# Patient Record
Sex: Male | Born: 1970
Health system: Southern US, Community
[De-identification: ages and names within clinical notes are randomized; demographics above are authoritative.]

## PROBLEM LIST (undated history)

## (undated) DIAGNOSIS — F419 Anxiety disorder, unspecified: Secondary | ICD-10-CM

## (undated) DIAGNOSIS — J45909 Unspecified asthma, uncomplicated: Secondary | ICD-10-CM

## (undated) DIAGNOSIS — N189 Chronic kidney disease, unspecified: Secondary | ICD-10-CM

## (undated) DIAGNOSIS — T7840XA Allergy, unspecified, initial encounter: Secondary | ICD-10-CM

## (undated) DIAGNOSIS — N200 Calculus of kidney: Secondary | ICD-10-CM

## (undated) HISTORY — DX: Unspecified asthma, uncomplicated: J45.909

## (undated) HISTORY — DX: Chronic kidney disease, unspecified: N18.9

## (undated) HISTORY — DX: Allergy, unspecified, initial encounter: T78.40XA

## (undated) HISTORY — PX: HERNIA REPAIR: SHX51

## (undated) HISTORY — PX: WISDOM TOOTH EXTRACTION: SHX21

---

## 2004-07-28 ENCOUNTER — Emergency Department: Payer: Self-pay | Admitting: Emergency Medicine

## 2005-01-02 ENCOUNTER — Ambulatory Visit: Payer: Self-pay

## 2005-03-15 IMAGING — CT CT ABD-PELV W/O CM
1 of 3 series · 15 of 32 positions shown, 19 images · non-contrast
Comparison: none

REASON FOR EXAM: (1) abd pain   poss stone  rm 4; (2) abd pain  poss stone
COMMENTS:

[Series 3: inspace · axial · 0.61mm/px · z∈[-1082,-703]mm · 15 of 517 slices shown, 19 images]
[im 22/517  soft-tissue]
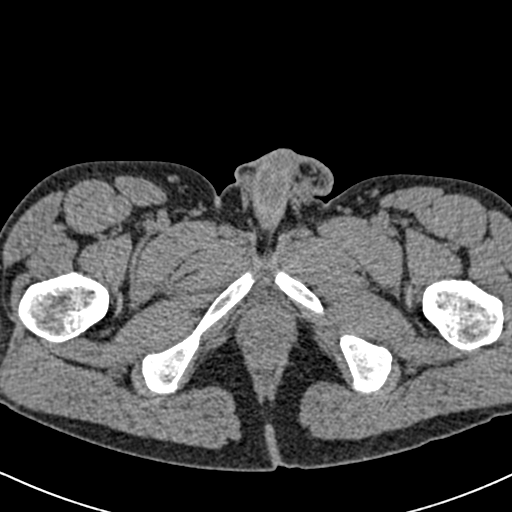
[im 22/517  bone]
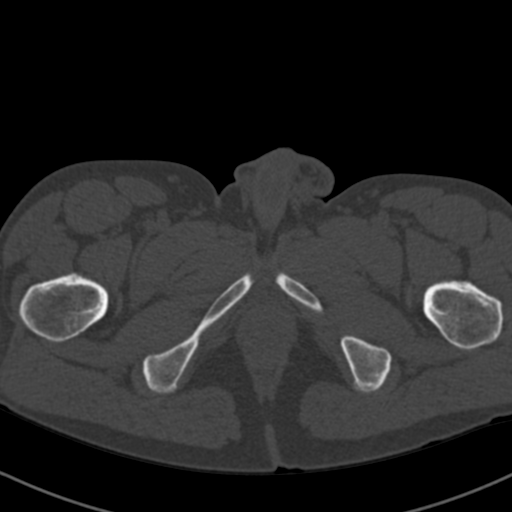
[im 65/517  soft-tissue]
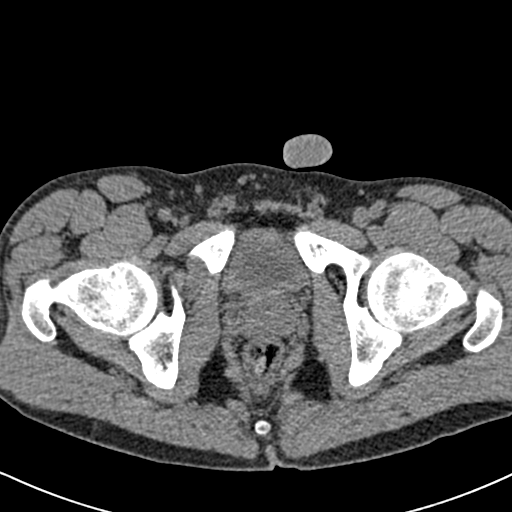
[im 108/517  soft-tissue]
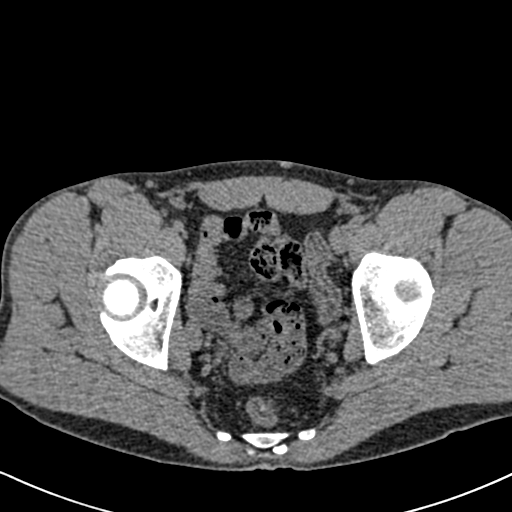
[im 151/517  soft-tissue]
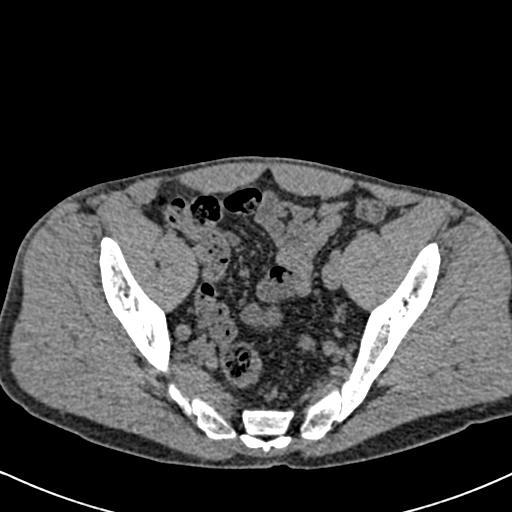
[im 173/517  soft-tissue]
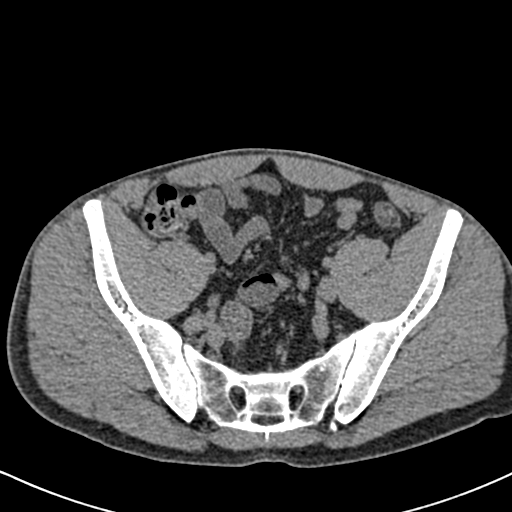
[im 216/517  soft-tissue]
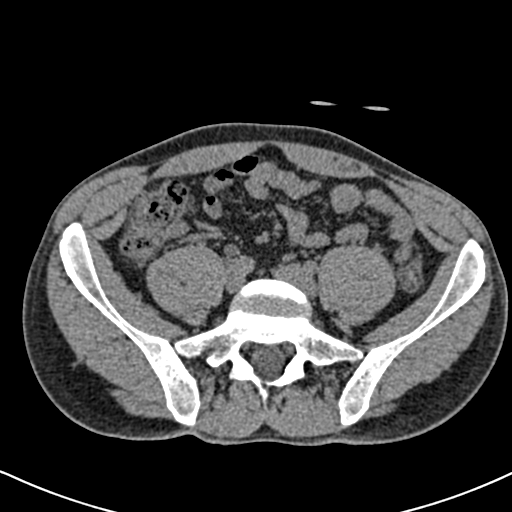
[im 259/517  soft-tissue]
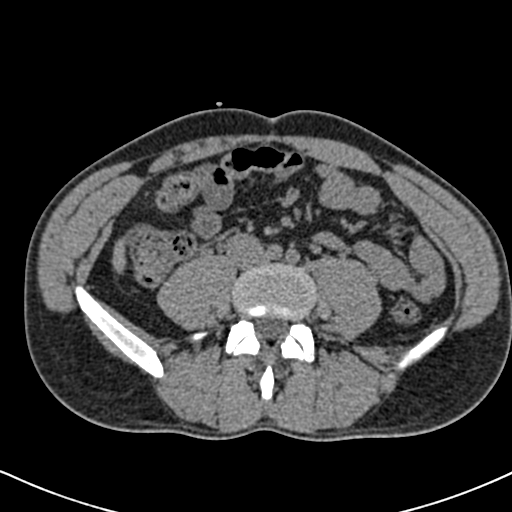
[im 302/517  soft-tissue]
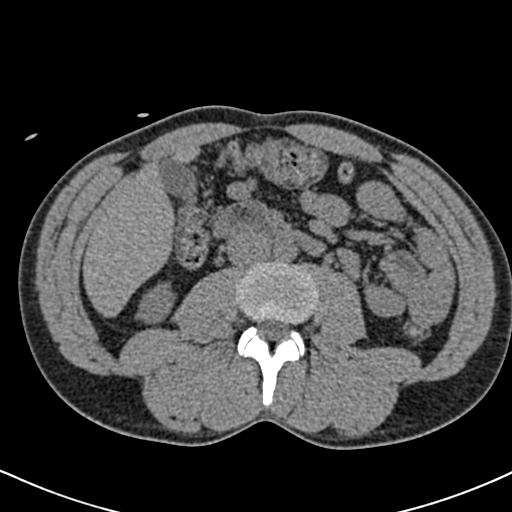
[im 345/517  soft-tissue]
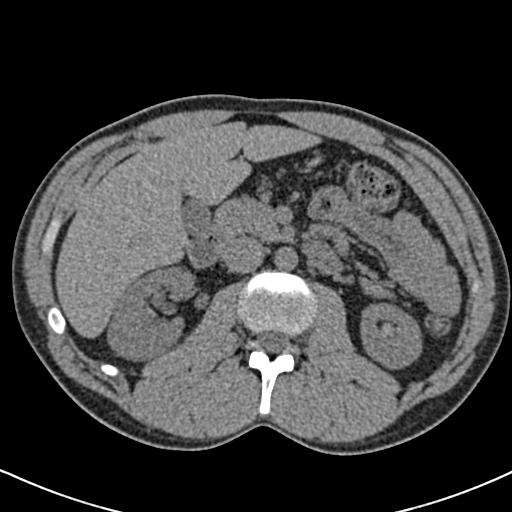
[im 345/517  bone]
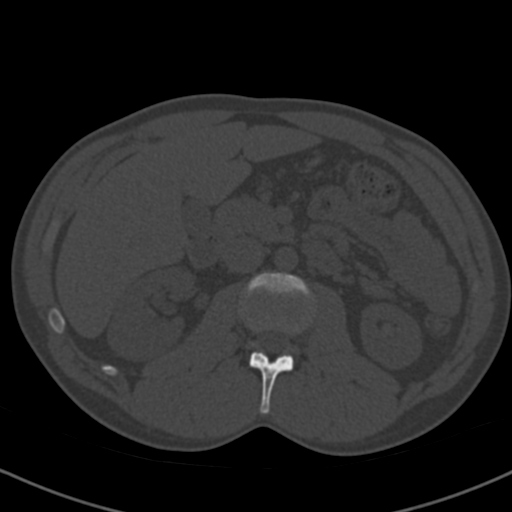
[im 366/517  soft-tissue]
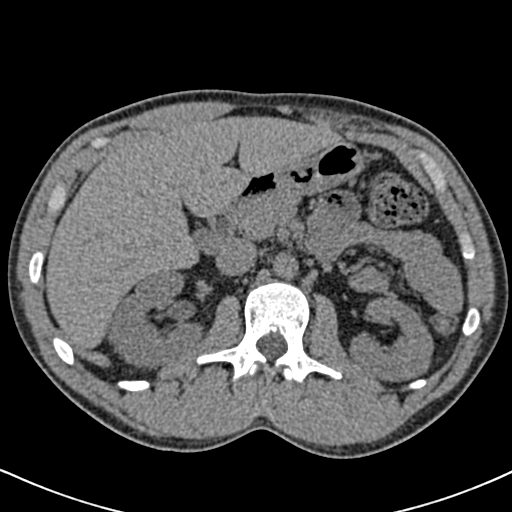
[im 409/517  soft-tissue]
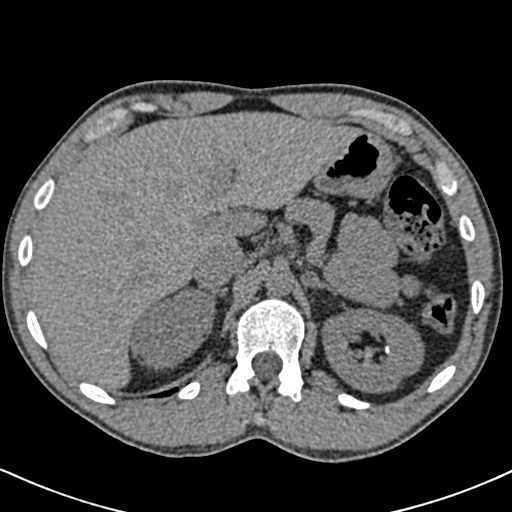
[im 431/517  lung]
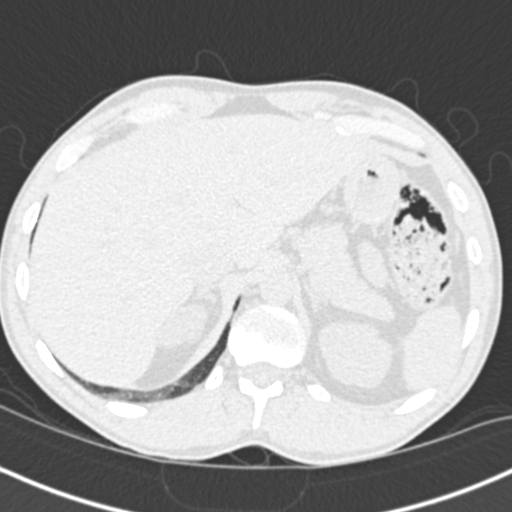
[im 452/517  soft-tissue]
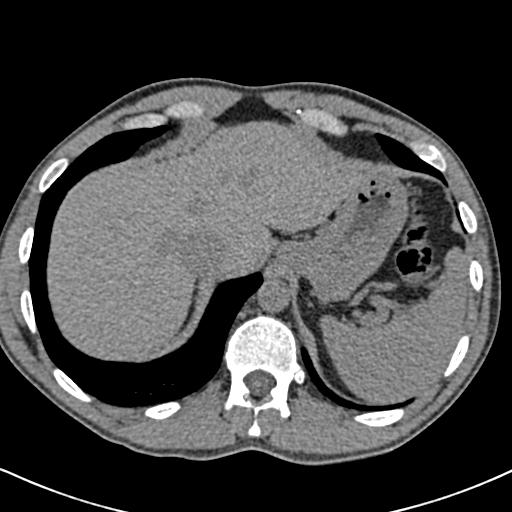
[im 452/517  lung]
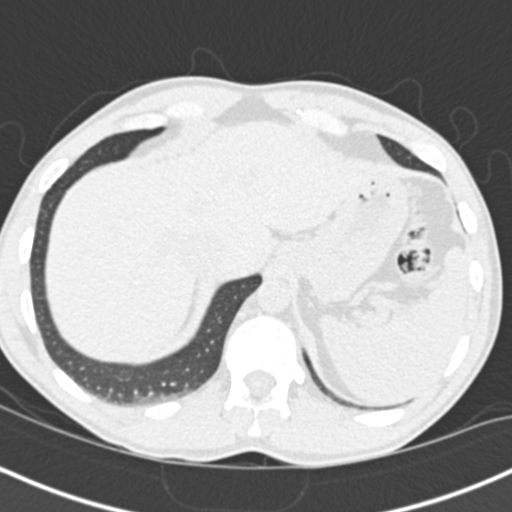
[im 474/517  lung]
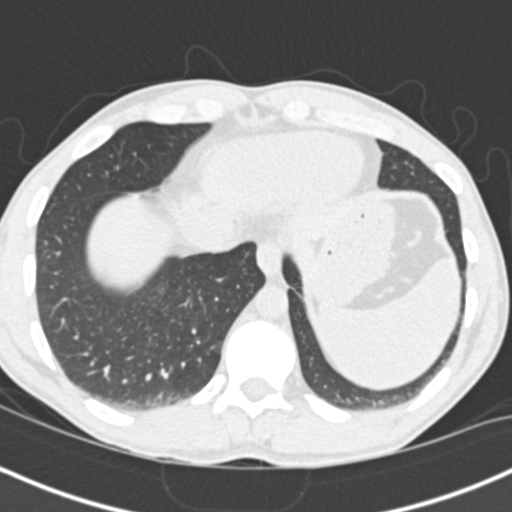
[im 495/517  soft-tissue]
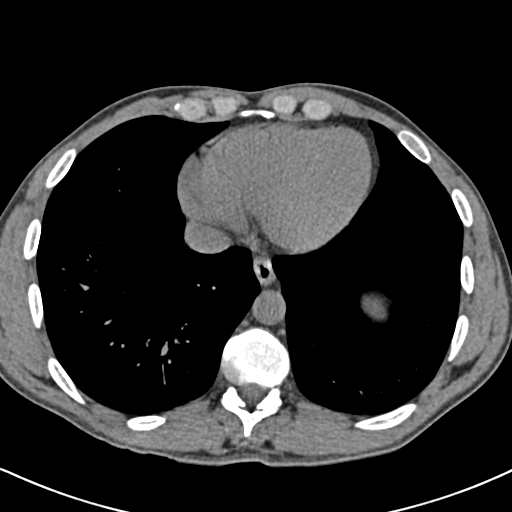
[im 495/517  lung]
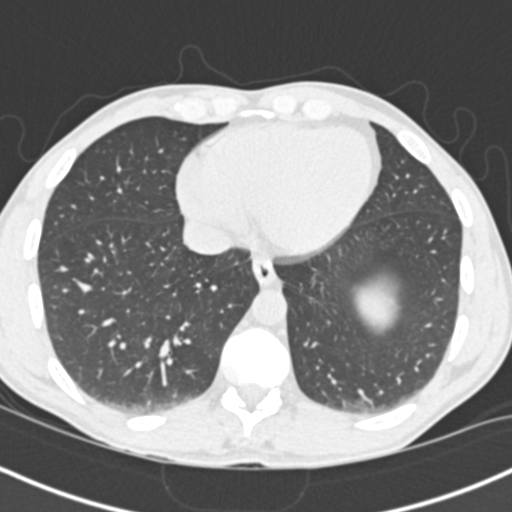

[15 of 32 positions shown; findings below may reference images not displayed]

PROCEDURE:     CT  - CT ABDOMEN AND PELVIS W[DATE]  [DATE]

RESULT:        3-mm unenhanced cuts through the abdomen and pelvis were
performed.  The lung bases are clear aside from some dependent atelectasis.
The unenhanced cuts through the abdomen and pelvis show no obvious solid
organ abnormality aside from the kidneys. Each kidneys show small 1-2 mm
nonobstructing corticomedullary junction nephrolithiasis.  The LEFT kidney
does not show evidence of hydronephrosis, hydroureter or perinephric
stranding.  The RIGHT kidney does not show perinephric stranding but there
is Grade I-II hydronephrosis and hydroureter present.  No free
intraperitoneal fluid, air or adenopathy is present.  Image #115 in the
pelvis shows a 2 mm calcification in the distal RIGHT ureter near the RIGHT
UVJ and most likely causing the hydroureter and hydronephrosis of the RIGHT
kidney.  The bladder distends normally without evidence of a filling defect
or wall thickening.  There is no diverticular disease in the sigmoid colon
or evidence of a suspicious fluid collection.  The bony structures are
normal.
IMPRESSION: 1.     2-mm calcification in the distal RIGHT ureter causing Grade I-II
hydronephrosis and hydroureter.
2.     Bilateral nonobstructing 1-2 mm corticomedullary junction
nephrolithiasis.
3.     The remaining solid organs of the abdomen are unremarkable.
4.     No free intraperitoneal fluid, air or adenopathy.
5.     Some mild dependent atelectasis.

Results were called to Dr. Abacousnac in the Emergency Room at the completion of
dictation.

## 2010-04-27 ENCOUNTER — Emergency Department: Payer: Self-pay | Admitting: Emergency Medicine

## 2010-06-26 ENCOUNTER — Ambulatory Visit: Payer: Self-pay | Admitting: Family Medicine

## 2010-06-26 DIAGNOSIS — F17200 Nicotine dependence, unspecified, uncomplicated: Secondary | ICD-10-CM | POA: Insufficient documentation

## 2010-06-26 DIAGNOSIS — J309 Allergic rhinitis, unspecified: Secondary | ICD-10-CM | POA: Insufficient documentation

## 2010-06-26 DIAGNOSIS — Z720 Tobacco use: Secondary | ICD-10-CM | POA: Insufficient documentation

## 2010-08-29 NOTE — Assessment & Plan Note (Signed)
Summary: sinus infection/jbb   Vital Signs:  Patient Profile:   40 Years Old Male CC:      Possible Sinus Infection Height:     68 inches Weight:      157 pounds BMI:     23.96 O2 Sat:      97 % O2 treatment:    Room Air Temp:     97.6 degrees F oral Pulse rate:   70 / minute Pulse rhythm:   regular Resp:     18 per minute BP sitting:   135 / 86  (right arm)  Pt. in pain?   yes    Location:   head    Intensity:   3    Type:       aching  Vitals Entered By: Levonne Spiller EMT-P (June 26, 2010 3:08 PM)              Is Patient Diabetic? No Comments Pt. is a smoker. 1 pack per day.      Current Allergies: No known allergies History of Present Illness History from: patient Chief Complaint: Possible Sinus Infection History of Present Illness: Pt presenting today because he has had a sinus infection that does not seem to get better.  He was traveling to [place] 1 month ago with family and got a prescription for penicillin and took it for 7 days but the infection only got some better and never really completely resolved and now the patient says it has come back and gotten worse and the patient is concerned about it causing worsening headaches and he can taste bitter infection in his sinus drainage and he is feeling ill with the drainage and headaches and dental pain.  No fever or chills but no diarrhea and no chest pain.  He is a smoker and reports that he has had some mild intermittent wheezing.     REVIEW OF SYSTEMS Constitutional Symptoms       Complains of fatigue.     Denies fever, chills, night sweats, weight loss, and weight gain.  Eyes       Denies change in vision, eye pain, eye discharge, glasses, contact lenses, and eye surgery. Ear/Nose/Throat/Mouth       Complains of frequent runny nose, sinus problems, and sore throat.      Denies hearing loss/aids, change in hearing, ear pain, ear discharge, dizziness, frequent nose bleeds, hoarseness, and tooth pain or  bleeding.  Respiratory       Complains of productive cough, wheezing, and shortness of breath.      Denies dry cough, asthma, bronchitis, and emphysema/COPD.  Cardiovascular       Denies murmurs, chest pain, and tires easily with exhertion.    Gastrointestinal       Denies stomach pain, nausea/vomiting, diarrhea, constipation, blood in bowel movements, and indigestion. Genitourniary       Denies painful urination, kidney stones, and loss of urinary control. Neurological       Denies paralysis, seizures, and fainting/blackouts. Musculoskeletal       Denies muscle pain, joint pain, joint stiffness, decreased range of motion, redness, swelling, muscle weakness, and gout.  Skin       Denies bruising, unusual mles/lumps or sores, and hair/skin or nail changes.  Psych       Denies mood changes, temper/anger issues, anxiety/stress, speech problems, depression, and sleep problems.  Past History:  Family History: Last updated: 06/26/2010 No significant reported  Social History: Last updated: 06/26/2010 Married Current Smoker  Alcohol use-yes Drug use-no  Past Medical History: Allergic rhinitis Chronic Active Nicotine Dependence  Past Surgical History: Denies surgical history  Family History: No significant reported  Social History: Married Current Smoker Alcohol use-yes Drug use-no Smoking Status:  current Drug Use:  no Packs/Day:  1.0 Physical Exam General appearance: well developed, well nourished, no acute distress Head: normocephalic, atraumatic Eyes: conjunctivae and lids normal Pupils: equal, round, reactive to light Ears: normal, no lesions or deformities Nasal: thick yellowish drainage Oral/Pharynx: pharyngeal erythema without exudate, uvula midline without deviation Neck: neck supple,  trachea midline, no masses Thyroid: no nodules, masses, tenderness, or enlargement Chest/Lungs: no rales, wheezes, or rhonchi bilateral, breath sounds equal without  effort Heart: regular rate and  rhythm, no murmur Abdomen: soft, non-tender without obvious organomegaly Extremities: normal extremities Neurological: grossly intact and non-focal Skin: no obvious rashes or lesions MSE: oriented to time, place, and person Assessment New Problems: CIGARETTE SMOKER (ICD-305.1) ACUTE SINUSITIS, UNSPECIFIED (ICD-461.9) ALLERGIC RHINITIS (ICD-477.9)   Patient Education: The risks, benefits and possible side effects were clearly explained and discussed with the patient.  The patient verbalized clear understanding.  The patient was given instructions to return if symptoms don't improve, worsen or new changes develop.  If it is not during clinic hours and the patient cannot get back to this clinic then the patient was told to seek medical care at an available urgent care or emergency department.  The patient verbalized understanding.   Demonstrates willingness to comply.  Plan New Medications/Changes: DELSYM 30 MG/5ML LQCR (DEXTROMETHORPHAN POLISTIREX) take 1 teaspoon by mouth every 12 hours as needed for cough  #60 mL x 0, 06/26/2010, Clanford Johnson MD FLUTICASONE PROPIONATE 50 MCG/ACT SUSP (FLUTICASONE PROPIONATE) 2 sprays per nostril once daily  #1 x 0, 06/26/2010, Clanford Johnson MD DOXYCYCLINE HYCLATE 100 MG TABS (DOXYCYCLINE HYCLATE) take 1 by mouth two times a day with food until completed  #20 x 0, 06/26/2010, Standley Dakins MD  Planning Comments:   The patient was counseled and advised to stop using all tobacco products.  Medical assistance was offered and the patient was encouraged to call 1-800-QUIT-NOW to get a smoking cessation coach.    Follow Up: Follow up in 2-3 days if no improvement, Follow up on an as needed basis, Follow up with Primary Physician  The patient and/or caregiver has been counseled thoroughly with regard to medications prescribed including dosage, schedule, interactions, rationale for use, and possible side effects and they  verbalize understanding.  Diagnoses and expected course of recovery discussed and will return if not improved as expected or if the condition worsens. Patient and/or caregiver verbalized understanding.  Prescriptions: DELSYM 30 MG/5ML LQCR (DEXTROMETHORPHAN POLISTIREX) take 1 teaspoon by mouth every 12 hours as needed for cough  #60 mL x 0   Entered and Authorized by:   Standley Dakins MD   Signed by:   Standley Dakins MD on 06/26/2010   Method used:   Electronically to        [id number] ([facility]       [address]       [place], [place]  [address]       Ph: [phone]       Fax: [phone]   RxID:   [id number] FLUTICASONE PROPIONATE 50 MCG/ACT SUSP (FLUTICASONE PROPIONATE) 2 sprays per nostril once daily  #1 x 0   Entered and Authorized by:   Standley Dakins MD   Signed by:   Standley Dakins MD on 06/26/2010  Method used:   [facility]ically to        [id number]       [address]       [place], [place]  [address]       Ph: [phone]       Fax: [phone]   RxID:   [id number] DOXYCYCLINE HYCLATE 100 MG TABS (DOXYCYCLINE HYCLATE) take 1 by mouth two times a day with food until completed  #20 x 0   Entered and Authorized by:   Standley Dakins MD   Signed by:   Standley Dakins MD on 06/26/2010   Method used:   Electronically to        [id number] ([facility]       [address]       [place], [place]  [address]       Ph: [phone]       Fax: [phone]   RxID:   [id number]   Patient Instructions: 1)  Tobacco is very bad for your health and your loved ones! You Should stop smoking!. 2)  Stop Smoking Tips: Choose a Quit date. Cut down before the Quit date. decide what you will do as a substitute when you feel the urge to smoke(gum,toothpick,exercise). 3)  Get plenty of rest, drink lots of clear liquids, and use Tylenol or Ibuprofen for fever and comfort. Return in 7-10 days if you're not  better:sooner if you're feeling worse. 4)  Take 650-1000mg  of Tylenol every 4-6 hours as needed for relief of pain or comfort of fever AVOID taking more than 4000mg   in a 24 hour period (can cause liver damage in higher doses). 5)  Oral Rehydration Solution: drink 1/2 ounce every 15 minutes. If tolerated afert 1 hour, drink 1 ounce every 15 minutes. As you can tolerate, keep adding 1/2 ounce every 15 minutes, up to a total of 2-4 ounces. Contact the office if unable to tolerate oral solution, if you keep vomiting, or you continue to have signs of dehydration. 6)  Take your antibiotic as prescribed until ALL of it is gone, but stop if you develop a rash or swelling and contact our office as soon as possible. 7)  The patient was informed that there is no on-call provider or services available at this clinic during off-hours (when the clinic is closed).  If the patient developed a problem or concern that required immediate attention, the patient was advised to go the the nearest available urgent care or emergency department for medical care.  The patient verbalized understanding.       Preventive Screening-Counseling & Management  Alcohol-Tobacco     Smoking Status: current     Smoking Cessation Counseling: yes     Smoke Cessation Stage: precontemplative     Packs/Day: 1.0     Tobacco Counseling: to quit use of tobacco products      Drug Use:  no.

## 2010-09-29 ENCOUNTER — Ambulatory Visit: Payer: Self-pay | Admitting: Family Medicine

## 2010-09-29 ENCOUNTER — Encounter: Payer: Self-pay | Admitting: Family Medicine

## 2010-09-29 DIAGNOSIS — J309 Allergic rhinitis, unspecified: Secondary | ICD-10-CM

## 2010-09-29 DIAGNOSIS — F17200 Nicotine dependence, unspecified, uncomplicated: Secondary | ICD-10-CM

## 2010-09-29 DIAGNOSIS — J45909 Unspecified asthma, uncomplicated: Secondary | ICD-10-CM | POA: Insufficient documentation

## 2010-10-05 NOTE — Assessment & Plan Note (Signed)
Summary: NEED A INHALER   Vital Signs:  Patient Profile:   40 Years Old Male CC:      wheezing Height:     68 inches O2 Sat:      99 % O2 treatment:    Room Air Temp:     97.6 degrees F oral Pulse rate:   68 / minute Pulse rhythm:   regular Resp:     13 per minute BP sitting:   134 / 92  (left arm)  Pt. in pain?   no                   Current Allergies (reviewed today): No known allergies History of Present Illness History from: patient Chief Complaint: wheezing History of Present Illness: The patient presented today because he stopped smoking 2 weeks ago and then relapsed a couple of days ago while golfing outdoors and experienced a recurrence of symptoms of allergic bronchitis he had since childhood.  He had wheezing, coughing and SOB. Nasal and sinus congestion, RN, ST, watery eyes.  He said that he normally uses an albuterol inhaler when needed and it clears up the wheezing, cough and SOB.  He is requesting that he get an rx for an albuterol inhaler to use as needed for recurrent symptoms.  He says that he is once again going to attempt to stop using cigarettes and all tobacco products.    REVIEW OF SYSTEMS Constitutional Symptoms      Denies fever, chills, night sweats, weight loss, weight gain, and fatigue.  Eyes       Denies change in vision, eye pain, eye discharge, glasses, contact lenses, and eye surgery. Ear/Nose/Throat/Mouth       Denies hearing loss/aids, change in hearing, ear pain, ear discharge, dizziness, frequent runny nose, frequent nose bleeds, sinus problems, sore throat, hoarseness, and tooth pain or bleeding.  Respiratory       Complains of dry cough, wheezing, and shortness of breath.      Denies productive cough, asthma, bronchitis, and emphysema/COPD.  Cardiovascular       Denies murmurs, chest pain, and tires easily with exhertion.    Gastrointestinal       Denies stomach pain, nausea/vomiting, diarrhea, constipation, blood in bowel movements, and  indigestion. Genitourniary       Denies painful urination, kidney stones, and loss of urinary control. Neurological       Denies paralysis, seizures, and fainting/blackouts. Musculoskeletal       Denies muscle pain, joint pain, joint stiffness, decreased range of motion, redness, swelling, muscle weakness, and gout.  Skin       Denies bruising, unusual mles/lumps or sores, and hair/skin or nail changes.  Psych       Denies mood changes, temper/anger issues, anxiety/stress, speech problems, depression, and sleep problems.  Past History:  Family History: Last updated: 06/26/2010 No significant reported  Social History: Last updated: 09/29/2010 Married Current Smoker but working to quit since March 2012 Alcohol use-yes 6 drinks/week Drug use-no  Risk Factors: Smoking Status: current (06/26/2010) Packs/Day: 1.0 (06/26/2010)  Past Medical History: Allergic rhinitis Chronic Active Nicotine Dependence - working to quit March 2012 Allergic Bronchitis since childhood  Past Surgical History: Reviewed history from 06/26/2010 and no changes required. Denies surgical history  Family History: Reviewed history from 06/26/2010 and no changes required. No significant reported  Social History: Married Current Smoker but working to quit since March 2012 Alcohol use-yes 6 drinks/week Drug use-no Physical Exam General appearance: well  developed, well nourished, no acute distress Head: normocephalic, atraumatic Eyes: conjunctivae and lids normal Pupils: equal, round, reactive to light Ears: normal, no lesions or deformities Nasal: pale, boggy, swollen nasal turbinates Oral/Pharynx: pharyngeal erythema without exudate, uvula midline without deviation Neck: neck supple,  trachea midline, no masses Chest/Lungs: no rales, wheezes, or rhonchi bilateral, breath sounds equal without effort Heart: regular rate and  rhythm, no murmur Extremities: normal extremities Neurological: grossly  intact and non-focal Skin: no obvious rashes or lesions MSE: oriented to time, place, and person Assessment  Assessed CIGARETTE SMOKER as improved - Standley Dakins MD New Problems: BRONCHITIS, ALLERGIC (ICD-493.90)   Patient Education: Patient and/or caregiver instructed in the following: quit smoking. The risks, benefits and possible side effects were clearly explained and discussed with the patient.  The patient verbalized clear understanding.  The patient was given instructions to return if symptoms don't improve, worsen or new changes develop.  If it is not during clinic hours and the patient cannot get back to this clinic then the patient was told to seek medical care at an available urgent care or emergency department.  The patient verbalized understanding.   Demonstrates willingness to comply.  Plan New Medications/Changes: FLUTICASONE PROPIONATE 50 MCG/ACT SUSP (FLUTICASONE PROPIONATE) 2 sprays per nostril once daily  #1 x 1, 09/29/2010, Ulysees Robarts MD LORATADINE 10 MG TABS (LORATADINE) take 1 by mouth daily for allergies  #30 x 1, 09/29/2010, Sua Spadafora MD VENTOLIN HFA 108 (90 BASE) MCG/ACT AERS (ALBUTEROL SULFATE) 2 puffs inh every 4 hours as needed for wheezing, SOB, cough  #1 x 1, 09/29/2010, Standley Dakins MD  Planning Comments:   The patient was counseled and advised to stop using all tobacco products.  Medical assistance was offered and the patient was encouraged to call 1-800-QUIT-NOW to get a smoking cessation coach.    Follow Up: Follow up in 2-3 days if no improvement, Follow up on an as needed basis, Follow up with Primary Physician  The patient and/or caregiver has been counseled thoroughly with regard to medications prescribed including dosage, schedule, interactions, rationale for use, and possible side effects and they verbalize understanding.  Diagnoses and expected course of recovery discussed and will return if not improved as expected or if the  condition worsens. Patient and/or caregiver verbalized understanding.  Prescriptions: FLUTICASONE PROPIONATE 50 MCG/ACT SUSP (FLUTICASONE PROPIONATE) 2 sprays per nostril once daily  #1 x 1   Entered and Authorized by:   Standley Dakins MD   Signed by:   Standley Dakins MD on 09/29/2010   Method used:   Electronically to        [id number]       [address]       [place], [place]  [address]       Ph: [phone]       Fax: [phone]   RxID:   [id number] LORATADINE 10 MG TABS (LORATADINE) take 1 by mouth daily for allergies  #30 x 1   Entered and Authorized by:   Standley Dakins MD   Signed by:   Standley Dakins MD on 09/29/2010   Method used:   Electronically to        [id number]       [address]       [place], [place]  [address]       Ph: [phone]       Fax: [phone]   RxID:   [id number] VENTOLIN HFA 108 (  90 BASE) MCG/ACT AERS (ALBUTEROL SULFATE) 2 puffs inh every 4 hours as needed for wheezing, SOB, cough  #1 x 1   Entered and Authorized by:   Standley Dakins MD   Signed by:   Standley Dakins MD on 09/29/2010   Method used:   Electronically to        [id number]       [address]       [place], [place]  [address]       Ph: [phone]       Fax: [phone]   RxID:   [id number]   Patient Instructions: 1)  Tobacco is very bad for your health and your loved ones! You Should stop smoking!. 2)  Stop Smoking Tips: Choose a Quit date. Cut down before the Quit date. decide what you will do as a substitute when you feel the urge to smoke(gum,toothpick,exercise). 3)  The patient was counseled and advised to stop using all tobacco products.  Medical assistance was offered and the patient was encouraged to call 1-800-QUIT-NOW to get a smoking cessation coach.    4)  Return or go to the ER if no improvement or symptoms getting worse.   5)  The patient was informed that  there is no on-call provider or services available at this clinic during off-hours (when the clinic is closed).  If the patient developed a problem or concern that required immediate attention, the patient was advised to go the the nearest available urgent care or emergency department for medical care.  The patient verbalized understanding.

## 2010-10-26 NOTE — Letter (Signed)
Summary: history form  history form   Imported By: Eugenio Hoes 10/16/2010 13:14:00  _____________________________________________________________________  External Attachment:    Type:   Image     Comment:   External Document

## 2011-08-16 ENCOUNTER — Emergency Department: Payer: Self-pay | Admitting: Emergency Medicine

## 2011-08-16 LAB — BASIC METABOLIC PANEL
Anion Gap: 13 (ref 7–16)
BUN: 12 mg/dL (ref 7–18)
Chloride: 104 mmol/L (ref 98–107)
Co2: 26 mmol/L (ref 21–32)
Creatinine: 1.1 mg/dL (ref 0.60–1.30)
EGFR (African American): >60
EGFR (Non-African Amer.): >60
Glucose: 118 mg/dL — ABNORMAL HIGH (ref 65–99)
Osmolality: 286 (ref 275–301)

## 2011-08-16 LAB — URINALYSIS, COMPLETE
Bacteria: NONE SEEN
Bilirubin,UR: NEGATIVE
Glucose,UR: NEGATIVE mg/dL (ref 0–75)
Ketone: NEGATIVE
Ph: 5 (ref 4.5–8.0)
RBC,UR: 169 /HPF (ref 0–5)
Specific Gravity: 1.019 (ref 1.003–1.030)
Squamous Epithelial: NONE SEEN

## 2011-08-16 LAB — CBC
HCT: 45 % (ref 40.0–52.0)
MCH: 33.1 pg (ref 26.0–34.0)
MCHC: 34.2 g/dL (ref 32.0–36.0)
MCV: 97 fL (ref 80–100)
RDW: 13.4 % (ref 11.5–14.5)

## 2015-08-11 ENCOUNTER — Ambulatory Visit: Payer: Self-pay | Admitting: Family Medicine

## 2015-08-26 ENCOUNTER — Encounter: Payer: Self-pay | Admitting: Family Medicine

## 2015-08-29 ENCOUNTER — Encounter: Payer: Self-pay | Admitting: Family Medicine

## 2015-08-29 ENCOUNTER — Ambulatory Visit (INDEPENDENT_AMBULATORY_CARE_PROVIDER_SITE_OTHER): Payer: BLUE CROSS/BLUE SHIELD | Admitting: Family Medicine

## 2015-08-29 VITALS — BP 138/93 | HR 85 | Temp 99.1°F | Ht 67.3 in | Wt 165.0 lb

## 2015-08-29 DIAGNOSIS — K1370 Unspecified lesions of oral mucosa: Secondary | ICD-10-CM | POA: Diagnosis not present

## 2015-08-29 NOTE — Progress Notes (Signed)
BP 138/93 mmHg  Pulse 85  Temp(Src) 99.1 F (37.3 C)  Ht 5' 7.3" (1.709 m)  Wt 165 lb (74.844 kg)  BMI 25.63 kg/m2  SpO2 98%   Subjective:    Patient ID: Timothy Finley, male    DOB: 1971-06-30, 45 y.o.   MRN: [id number]  HPI: Timothy Finley is a 45 y.o. male  Chief Complaint  Patient presents with  . Mass    Patient states that he has a lump on the inside of his bottom right lip, he states that it is not as large as it was, but it feels like it is spreading   SKIN LESION Duration: 3 months, about a month ago started to recede and now is spreading outwards Location: inside of R lower lip Painful: no Itching: no Onset: sudden Context: changing Associated signs and symptoms:  History of skin cancer: no History of precancerous skin lesions: no Family history of skin cancer: yes  Does not follow with dentist Smokes about 3 cigs per day, does 1-2x a day, uses chew, this is where he holds his chew. Has been cutting down, has been smoking 1ppd for 24-25 years  Saw Chief Financial Officer in [place] not too long ago.   Relevant past medical, surgical, family and social history reviewed and updated as indicated. Interim medical history since our last visit reviewed. Allergies and medications reviewed and updated.  Review of Systems  Constitutional: Negative.   HENT: Negative.   Respiratory: Positive for shortness of breath (Has been better with cutting down on the smoking). Negative for apnea, cough, choking, chest tightness, wheezing and stridor.   Cardiovascular: Negative.   Psychiatric/Behavioral: Negative.     Per HPI unless specifically indicated above     Objective:    BP 138/93 mmHg  Pulse 85  Temp(Src) 99.1 F (37.3 C)  Ht 5' 7.3" (1.709 m)  Wt 165 lb (74.844 kg)  BMI 25.63 kg/m2  SpO2 98%  Wt Readings from Last 3 Encounters:  08/29/15 165 lb (74.844 kg)  04/17/13 154 lb (69.854 kg)  06/26/10 157 lb (71.215 kg)    Physical Exam  Constitutional: He is  oriented to person, place, and time. He appears well-developed and well-nourished. No distress.  HENT:  Head: Normocephalic and atraumatic.  Right Ear: Hearing normal.  Left Ear: Hearing normal.  Nose: Nose normal.  Mouth/Throat: Oropharynx is clear and moist. No oropharyngeal exudate.  0.5cm spiculated lesion deep to the mucosa on the right lower lip about 1.5cm, small ulceration on the mucosa  Eyes: Conjunctivae and lids are normal. Right eye exhibits no discharge. Left eye exhibits no discharge. No scleral icterus.  Cardiovascular: Normal rate, regular rhythm, normal heart sounds and intact distal pulses.  Exam reveals no gallop and no friction rub.   No murmur heard. Pulmonary/Chest: Effort normal and breath sounds normal. No respiratory distress. He has no wheezes. He has no rales. He exhibits no tenderness.  Musculoskeletal: Normal range of motion.  Neurological: He is alert and oriented to person, place, and time.  Skin: Skin is warm, dry and intact. No rash noted. No erythema. No pallor.  Psychiatric: He has a normal mood and affect. His speech is normal and behavior is normal. Judgment and thought content normal. Cognition and memory are normal.  Nursing note and vitals reviewed.   Results for orders placed or performed in visit on 63/87/56  Basic metabolic panel  Result Value Ref Range   Glucose 118 (H) 65-99 mg/dL   BUN  12 7-18 mg/dL   Creatinine 1.10 0.60-1.30 mg/dL   Sodium 143 136-145 mmol/L   Potassium 3.8 3.5-5.1 mmol/L   Chloride 104 98-107 mmol/L   Co2 26 21-32 mmol/L   Calcium, Total 9.2 8.5-10.1 mg/dL   Osmolality 286 275-301   Anion Gap 13 7-16   EGFR (African American) >60 >13m/min   EGFR (Non-African Amer.) >60 >620mmin  CBC  Result Value Ref Range   WBC 12.8 (H) 3.8-10.6 x10 3/mm 3   RBC 4.64 4.40-5.90 x10 6/mm 3   HGB 15.4 13.0-18.0 g/dL   HCT 45.0 40.0-52.0 %   MCV 97 80-100 fL   MCH 33.1 26.0-34.0 pg   MCHC 34.2 32.0-36.0 g/dL   RDW 13.4  11.5-14.5 %   Platelet 312 150-440 x10 3/mm 3  Urinalysis, Complete  Result Value Ref Range   Color - urine Yellow    Clarity - urine Cloudy    Glucose,UR Negative 0-75 mg/dL   Bilirubin,UR Negative NEGATIVE   Ketone Negative NEGATIVE   Specific Gravity 1.019 1.003-1.030   Blood 3+ NEGATIVE   Ph 5.0 4.5-8.0   Protein 30 mg/dL NEGATIVE   Nitrite Negative NEGATIVE   Leukocyte Esterase Negative NEGATIVE   RBC,UR 169 /HPF 0-5 /HPF   WBC UR 6 /HPF 0-5 /HPF   Bacteria NONE SEEN NONE SEEN   Squamous Epithelial NONE SEEN    Mucous PRESENT       Assessment & Plan:   Problem List Items Addressed This Visit    None    Visit Diagnoses    Oral lesion    -  Primary    Spiculated mass. Heavy smoker and chewer. Will refer to oral surgery for evaluation and possible biopsy.    Relevant Orders    Ambulatory referral to Oral Maxillofacial Surgery        Follow up plan: Return if symptoms worsen or fail to improve.

## 2016-03-23 ENCOUNTER — Encounter: Payer: Self-pay | Admitting: Family Medicine

## 2016-03-23 ENCOUNTER — Ambulatory Visit (INDEPENDENT_AMBULATORY_CARE_PROVIDER_SITE_OTHER): Payer: BLUE CROSS/BLUE SHIELD | Admitting: Family Medicine

## 2016-03-23 DIAGNOSIS — F410 Panic disorder [episodic paroxysmal anxiety] without agoraphobia: Secondary | ICD-10-CM

## 2016-03-23 MED ORDER — CITALOPRAM HYDROBROMIDE 20 MG PO TABS: 20.0000 mg | ORAL_TABLET | Freq: Every day | ORAL | 2 refills | Status: DC

## 2016-03-23 MED ORDER — PROAIR HFA 108 (90 BASE) MCG/ACT IN AERS: INHALATION_SPRAY | RESPIRATORY_TRACT | 6 refills | Status: DC

## 2016-03-23 MED ORDER — CLONAZEPAM 0.5 MG PO TABS: 0.5000 mg | ORAL_TABLET | Freq: Two times a day (BID) | ORAL | 0 refills | Status: DC | PRN

## 2016-03-23 NOTE — Assessment & Plan Note (Signed)
Will start citalopram daily and recheck in 1 month. Klonopin for severe anxiety and sleep. Call with concerns. Recheck 1 month.

## 2016-03-23 NOTE — Progress Notes (Signed)
BP 129/86 (BP Location: Left Arm, Patient Position: Sitting, Cuff Size: Normal)   Pulse 88   Temp 98 F (36.7 C)   Wt 156 lb (70.8 kg)   SpO2 98%   BMI 24.22 kg/m    Subjective:    Patient ID: Timothy Finley, male    DOB: 19-Oct-1970, 45 y.o.   MRN: [id number]  HPI: Timothy Finley is a 45 y.o. male  Chief Complaint  Patient presents with  . Hospitalization Follow-up   ER FOLLOW UP Time since discharge: 1 month Hospital/facility: [facility] Diagnosis: TIA? Procedures/tests: Echo and EKG Consultants: Neurology New medications: None Discharge instructions:  Follow up here Status: stable  [place] the [place] about [place], packing up to go home. It was hot, got dizzy and overheated. Had been drinking a bit too much, was pretty dehydrated. Notes that he gets very sweaty, very nervous, L fingers get tingly, this time L foot got tingly. Sat down for a while and didn't get better. Laid down, no better. Almost passed out when he went to go to the ER. EMT called. Went by car. Felt better by the time he got to the hospital. Has tried klonopin with this and felt better with that in the past.   ANXIETY/STRESS- having panic attacks about 1-2x a week Duration:uncontrolled Anxious mood: yes  Excessive worrying: yes Irritability: no  Sweating: yes Nausea: yes Palpitations:yes Hyperventilation: yes Panic attacks: yes Agoraphobia: no  Obscessions/compulsions: no Depressed mood: no Depression screen PHQ 2/9 03/23/2016  Decreased Interest 1  Down, Depressed, Hopeless 1  PHQ - 2 Score 2  Altered sleeping 2  Tired, decreased energy 1  Change in appetite 0  Feeling bad or failure about yourself  1  Trouble concentrating 0  Moving slowly or fidgety/restless 0  Suicidal thoughts 0  PHQ-9 Score 6   Anhedonia: no Weight changes: no Insomnia: yes hard to fall asleep  Hypersomnia: no Fatigue/loss of energy: yes Feelings of worthlessness: no Feelings of guilt: no Impaired  concentration/indecisiveness: no Suicidal ideations: no  Crying spells: no Recent Stressors/Life Changes: yes   Relationship problems: no   Family stress: no     Financial stress: yes    Job stress: yes    Recent death/loss: no  Relevant past medical, surgical, family and social history reviewed and updated as indicated. Interim medical history since our last visit reviewed. Allergies and medications reviewed and updated.  Review of Systems  Constitutional: Negative.   Respiratory: Negative.   Cardiovascular: Negative.   Psychiatric/Behavioral: Positive for agitation, behavioral problems and sleep disturbance. Negative for confusion, decreased concentration, dysphoric mood, hallucinations, self-injury and suicidal ideas. The patient is nervous/anxious. The patient is not hyperactive.     Per HPI unless specifically indicated above     Objective:    BP 129/86 (BP Location: Left Arm, Patient Position: Sitting, Cuff Size: Normal)   Pulse 88   Temp 98 F (36.7 C)   Wt 156 lb (70.8 kg)   SpO2 98%   BMI 24.22 kg/m   Wt Readings from Last 3 Encounters:  03/23/16 156 lb (70.8 kg)  08/29/15 165 lb (74.8 kg)  04/17/13 154 lb (69.9 kg)    Physical Exam  Constitutional: He is oriented to person, place, and time. He appears well-developed and well-nourished. No distress.  HENT:  Head: Normocephalic and atraumatic.  Right Ear: Hearing normal.  Left Ear: Hearing normal.  Nose: Nose normal.  Eyes: Conjunctivae and lids are normal. Right eye  exhibits no discharge. Left eye exhibits no discharge. No scleral icterus.  Pulmonary/Chest: Effort normal. No respiratory distress.  Musculoskeletal: Normal range of motion.  Neurological: He is alert and oriented to person, place, and time.  Skin: Skin is warm, dry and intact. No rash noted. No erythema. No pallor.  Psychiatric: His speech is normal and behavior is normal. Judgment and thought content normal. His mood appears anxious. Cognition  and memory are normal.  Nursing note and vitals reviewed.   Results for orders placed or performed in visit on 60/10/93  Basic metabolic panel  Result Value Ref Range   Glucose 118 (H) 65 - 99 mg/dL   BUN 12 7 - 18 mg/dL   Creatinine 1.10 0.60 - 1.30 mg/dL   Sodium 143 136 - 145 mmol/L   Potassium 3.8 3.5 - 5.1 mmol/L   Chloride 104 98 - 107 mmol/L   Co2 26 21 - 32 mmol/L   Calcium, Total 9.2 8.5 - 10.1 mg/dL   Osmolality 286 275 - 301   Anion Gap 13 7 - 16   EGFR (African American) >60 >77m/min   EGFR (Non-African Amer.) >60 >627mmin  CBC  Result Value Ref Range   WBC 12.8 (H) 3.8 - 10.6 x10 3/mm 3   RBC 4.64 4.40 - 5.90 x10 6/mm 3   HGB 15.4 13.0 - 18.0 g/dL   HCT 45.0 40.0 - 52.0 %   MCV 97 80 - 100 fL   MCH 33.1 26.0 - 34.0 pg   MCHC 34.2 32.0 - 36.0 g/dL   RDW 13.4 11.5 - 14.5 %   Platelet 312 150 - 440 x10 3/mm 3  Urinalysis, Complete  Result Value Ref Range   Color - urine Yellow    Clarity - urine Cloudy    Glucose,UR Negative 0 - 75 mg/dL   Bilirubin,UR Negative NEGATIVE   Ketone Negative NEGATIVE   Specific Gravity 1.019 1.003 - 1.030   Blood 3+ NEGATIVE   Ph 5.0 4.5 - 8.0   Protein 30 mg/dL NEGATIVE   Nitrite Negative NEGATIVE   Leukocyte Esterase Negative NEGATIVE   RBC,UR 169 /HPF 0 - 5 /HPF   WBC UR 6 /HPF 0 - 5 /HPF   Bacteria NONE SEEN NONE SEEN   Squamous Epithelial NONE SEEN    Mucous PRESENT       Assessment & Plan:   Problem List Items Addressed This Visit      Other   Panic disorder    Will start citalopram daily and recheck in 1 month. Klonopin for severe anxiety and sleep. Call with concerns. Recheck 1 month.       Relevant Medications   citalopram (CELEXA) 20 MG tablet    Other Visit Diagnoses   None.      Follow up plan: Return in about 4 weeks (around 04/20/2016) for panic.

## 2016-04-01 ENCOUNTER — Emergency Department
Admission: EM | Admit: 2016-04-01 | Discharge: 2016-04-01 | Disposition: A | Discharge status: Home / Self Care | Payer: BLUE CROSS/BLUE SHIELD | Attending: Emergency Medicine | Admitting: Emergency Medicine

## 2016-04-01 ENCOUNTER — Emergency Department: Payer: BLUE CROSS/BLUE SHIELD

## 2016-04-01 ENCOUNTER — Encounter: Payer: Self-pay | Admitting: *Deleted

## 2016-04-01 DIAGNOSIS — F410 Panic disorder [episodic paroxysmal anxiety] without agoraphobia: Secondary | ICD-10-CM | POA: Diagnosis not present

## 2016-04-01 DIAGNOSIS — F17210 Nicotine dependence, cigarettes, uncomplicated: Secondary | ICD-10-CM | POA: Insufficient documentation

## 2016-04-01 DIAGNOSIS — Z7982 Long term (current) use of aspirin: Secondary | ICD-10-CM | POA: Insufficient documentation

## 2016-04-01 DIAGNOSIS — R0789 Other chest pain: Secondary | ICD-10-CM | POA: Diagnosis not present

## 2016-04-01 DIAGNOSIS — R0602 Shortness of breath: Secondary | ICD-10-CM | POA: Diagnosis present

## 2016-04-01 DIAGNOSIS — Z79899 Other long term (current) drug therapy: Secondary | ICD-10-CM | POA: Insufficient documentation

## 2016-04-01 DIAGNOSIS — F1290 Cannabis use, unspecified, uncomplicated: Secondary | ICD-10-CM | POA: Insufficient documentation

## 2016-04-01 HISTORY — DX: Anxiety disorder, unspecified: F41.9

## 2016-04-01 LAB — BASIC METABOLIC PANEL
ANION GAP: 10 (ref 5–15)
BUN: 12 mg/dL (ref 6–20)
CO2: 24 mmol/L (ref 22–32)
Calcium: 9.3 mg/dL (ref 8.9–10.3)
Chloride: 104 mmol/L (ref 101–111)
Creatinine, Ser: 1 mg/dL (ref 0.61–1.24)
GFR calc Af Amer: >60 mL/min (ref >60)
GLUCOSE: 99 mg/dL (ref 65–99)
POTASSIUM: 4.4 mmol/L (ref 3.5–5.1)
Sodium: 138 mmol/L (ref 135–145)

## 2016-04-01 LAB — CBC
HEMATOCRIT: 45.9 % (ref 40.0–52.0)
HEMOGLOBIN: 16.2 g/dL (ref 13.0–18.0)
MCH: 35.2 pg — ABNORMAL HIGH (ref 26.0–34.0)
MCHC: 35.2 g/dL (ref 32.0–36.0)
MCV: 99.8 fL (ref 80.0–100.0)
Platelets: 280 10*3/uL (ref 150–440)
RBC: 4.6 MIL/uL (ref 4.40–5.90)
RDW: 13.9 % (ref 11.5–14.5)
WBC: 15.8 10*3/uL — AB (ref 3.8–10.6)

## 2016-04-01 LAB — TROPONIN I: Troponin I: <0.03 ng/mL (ref <0.03)

## 2016-04-01 NOTE — ED Notes (Signed)
Patient states, "I smoke a lot of marijuana I know what it can make you feel like". Patient states he feels completely back to normal. Denies pain, paranoia, or dizziness.

## 2016-04-01 NOTE — Discharge Instructions (Signed)
You have been seen in the Emergency Department (ED) today for chest pain.  As we have discussed today?s test results are normal, and we feel it is likely that panic attacks may be causing your symptoms.  Please follow up with the recommended doctor as instructed above in these documents regarding today?s emergent visit and your recent symptoms to discuss further management.  Continue to take your regular medications. If you are not doing so already, consider taking a daily baby aspirin (81 mg), at least until you follow up with your doctor.  Return to the Emergency Department (ED) if you experience any further chest pain/pressure/tightness, difficulty breathing, or sudden sweating, or other symptoms that concern you.

## 2016-04-01 NOTE — ED Triage Notes (Signed)
Pt states he was consuming ETOH last night and did not eat a meal. Pt states today he felt fine w/o complaints until he went to a party. Pt states he "drank a couple of beers and did a bowl-hit" of marijuana. It was after this that he became short of breath and sweaty. Pt denies respiratory difficulty at this time, no evidence of diaphoresis or distress during triage. Pt is A & O x 4 at this time, slightly hypertensive but other VS WNL at this time. Pt has slight dry cough but denies distress from it at this time.

## 2016-04-01 NOTE — ED Triage Notes (Signed)
Pt states similar problems x 1.5 months ago under similar circumstances. Pt rx'd an antidepressant and clonazepam. Pt states has been taking antidepressant x 3 days and the clonazepam intermittently.

## 2016-04-01 NOTE — ED Provider Notes (Signed)
[facility] Emergency Department Provider Note  ____________________________________________   First MD Initiated Contact with Patient 04/01/16 2015     (approximate)  I have reviewed the triage vital signs and the nursing notes.   HISTORY  Chief Complaint Shortness of Breath    HPI Timothy Finley is a 45 y.o. male history of anxiety, daily tobacco use, daily marijuana use, occasional alcohol use who presents for an episode of shortness of breath and chest discomfort that he says is consistent with a panic attack.  He states that this happened several months ago under similar circumstances.  He was hospitalized at that time and "every test known to man was performed by me" and nothing was discovered.  He states that earlier today he was at a fantasy football party and was drinking alcohol without having had any food today.  He then took a hit of marijuana and started to feel very paranoid and anxious, followed by the shortness of breath and chest discomfort he associates with a panic attack.The symptoms lasted for "a while" before they completely resolved.  He is asymptomatic at this time.  Nothing particular made it better or worse.  He is not having any vomiting, abdominal pain, headache, neck pain, dysuria.   Past Medical History:  Diagnosis Date  . Anxiety     Patient Active Problem List   Diagnosis Date Noted  . Panic disorder 03/23/2016  . BRONCHITIS, ALLERGIC 09/29/2010  . CIGARETTE SMOKER 06/26/2010  . ALLERGIC RHINITIS 06/26/2010    History reviewed. No pertinent surgical history.  Prior to Admission medications   Medication Sig Start Date End Date Taking? Authorizing Provider  aspirin EC 81 MG tablet Take 81 mg by mouth daily.    Historical Provider, MD  cetirizine (ZYRTEC ALLERGY) 10 MG tablet Take 10 mg by mouth daily.    Historical Provider, MD  citalopram (CELEXA) 20 MG tablet Take 1 tablet (20 mg total) by mouth daily. 1/2 tab  daily for 1 week, then 1 tab daily 03/23/16   Megan P Johnson, DO  clonazePAM (KLONOPIN) 0.5 MG tablet Take 1 tablet (0.5 mg total) by mouth 2 (two) times daily as needed for anxiety. 03/23/16   Megan P Johnson, DO  PROAIR HFA 108 (90 Base) MCG/ACT inhaler INHALE 2 INHALATIONS INTO THE LUNGS EVERY 6 (SIX) HOURS AS NEEDED FOR WHEEZING. 03/23/16   Megan P Johnson, DO    Allergies Review of patient's allergies indicates no known allergies.  Family History  Problem Relation Age of Onset  . Cancer Maternal Grandmother     Breast  . Cancer Maternal Grandfather   . Aneurysm Paternal Grandfather     Brain    Social History Social History  Substance Use Topics  . Smoking status: Current Every Day Smoker    Packs/day: 0.25    Types: Cigarettes  . Smokeless tobacco: Current User  . Alcohol use 8.4 oz/week    14 Cans of beer per week    Review of Systems Constitutional: No fever/chills Eyes: No visual changes. ENT: No sore throat. Cardiovascular: +chest discomfort Respiratory: +shortness of breath. Gastrointestinal: No abdominal pain.  No nausea, no vomiting.  No diarrhea.  No constipation. Genitourinary: Negative for dysuria. Musculoskeletal: Negative for back pain. Skin: Negative for rash. Neurological: Negative for headaches, focal weakness or numbness. Psych:  Panic attack earlier today  10-point ROS otherwise negative.  ____________________________________________   PHYSICAL EXAM:  VITAL SIGNS: ED Triage Vitals  Enc Vitals Group  BP 04/01/16 1838 (!) 146/90     Pulse Rate 04/01/16 1838 76     Resp 04/01/16 1838 20     Temp 04/01/16 1838 98 F (36.7 C)     Temp Source 04/01/16 1838 Oral     SpO2 04/01/16 1838 100 %     Weight 04/01/16 1839 155 lb (70.3 kg)     Height 04/01/16 1839 5\' 8"  (1.727 m)     Head Circumference --      Peak Flow --      Pain Score 04/01/16 1913 0     Pain Loc --      Pain Edu? --      Excl. in [place]? --     Constitutional: Alert and  oriented. Well appearing and in no acute distress. Eyes: Conjunctivae are normal. PERRL. EOMI. Head: Atraumatic. Nose: No congestion/rhinnorhea. Mouth/Throat: Mucous membranes are moist.  Oropharynx non-erythematous. Neck: No stridor.  No meningeal signs.   Cardiovascular: Normal rate, regular rhythm. Good peripheral circulation. Grossly normal heart sounds. Respiratory: Normal respiratory effort.  No retractions. Lungs CTAB. Gastrointestinal: Soft and nontender. No distention.  Musculoskeletal: No lower extremity tenderness nor edema. No gross deformities of extremities. Neurologic:  Normal speech and language. No gross focal neurologic deficits are appreciated.  Skin:  Skin is warm, dry and intact. No rash noted. Psychiatric: Mood and affect are normal. Speech and behavior are normal.  ____________________________________________   LABS (all labs ordered are listed, but only abnormal results are displayed)  Labs Reviewed  CBC - Abnormal; Notable for the following:       Result Value   WBC 15.8 (*)    MCH 35.2 (*)    All other components within normal limits  BASIC METABOLIC PANEL  TROPONIN I   ____________________________________________  EKG  ED ECG REPORT I, Labarron Durnin, the attending physician, personally viewed and interpreted this ECG.  Date: 04/01/2016 EKG Time: 18:47 Rate: 69 Rhythm: normal sinus rhythm QRS Axis: normal Intervals: normal ST/T Wave abnormalities: normal Conduction Disturbances: none Narrative Interpretation: unremarkable  ____________________________________________  RADIOLOGY   Dg Chest 2 View  Result Date: 04/01/2016 CLINICAL DATA:  Chest pressure and shortness of breath today. EXAM: CHEST  2 VIEW COMPARISON:  None. FINDINGS: The lungs are clear. Heart size is normal. No pneumothorax or pleural effusion. No focal bony abnormality. IMPRESSION: No active cardiopulmonary disease. Electronically Signed   By: Inge Rise M.D.   On:  04/01/2016 20:06    ____________________________________________   PROCEDURES  Procedure(s) performed:   Procedures   Critical Care performed: No ____________________________________________   INITIAL IMPRESSION / ASSESSMENT AND PLAN / ED COURSE  Pertinent labs & imaging results that were available during my care of the patient were reviewed by me and considered in my medical decision making (see chart for details).  The patient has a reassuring medical workup today with no acute abnormalities other than a leukocytosis which could be attributable to a stress reaction/panic attack.  He has no signs of infectious disease.  He is completely asymptomatic.  I counseled him about drinking and using marijuana, particularly in the setting of not eating or drinking very much today.  He recently started on Celexa and Klonopin and I encouraged him to follow closely with his primary care provider.  I am also providing him information about [facility].  He is at low risk for ACS based on a HEART score of 1, and I gave him my usual and customary return precautions.  Clinical Course  Value Comment By Time  DG Chest 2 View (Reviewed) Hinda Kehr, MD 09/03 1954    ____________________________________________  FINAL CLINICAL IMPRESSION(S) / ED DIAGNOSES  Final diagnoses:  Panic attack  Atypical chest pain     MEDICATIONS GIVEN DURING THIS VISIT:  Medications - No data to display   NEW OUTPATIENT MEDICATIONS STARTED DURING THIS VISIT:  New Prescriptions   No medications on file    Modified Medications   No medications on file    Discontinued Medications   No medications on file     Note:  This document was prepared using Dragon voice recognition software and may include unintentional dictation errors.    Hinda Kehr, MD 04/01/16 2033

## 2016-04-03 ENCOUNTER — Telehealth: Payer: Self-pay | Admitting: Family Medicine

## 2016-04-03 NOTE — Telephone Encounter (Signed)
Routing to provider for advice.

## 2016-04-03 NOTE — Telephone Encounter (Signed)
Pt called stated he had to be taken to the ER over the weekend due to a panic attack, wants to know if he should continue taking the medication prescribed to him by Dr. Wynetta Emery. Please call pt ASAP. Thanks.

## 2016-04-03 NOTE — Telephone Encounter (Signed)
He should keep taking them and he should come in ASAP for a follow up

## 2016-04-03 NOTE — Telephone Encounter (Signed)
Called and let patient know what Dr. Wynetta Emery said. Scheduled patient an appointment for 04/12/16 for ER f/up.

## 2016-04-12 ENCOUNTER — Inpatient Hospital Stay: Payer: Self-pay | Admitting: Family Medicine

## 2016-04-20 ENCOUNTER — Ambulatory Visit (INDEPENDENT_AMBULATORY_CARE_PROVIDER_SITE_OTHER): Payer: BLUE CROSS/BLUE SHIELD | Admitting: Family Medicine

## 2016-04-20 ENCOUNTER — Encounter: Payer: Self-pay | Admitting: Family Medicine

## 2016-04-20 DIAGNOSIS — F410 Panic disorder [episodic paroxysmal anxiety] without agoraphobia: Secondary | ICD-10-CM

## 2016-04-20 MED ORDER — CLONAZEPAM 0.5 MG PO TABS: 0.5000 mg | ORAL_TABLET | Freq: Two times a day (BID) | ORAL | 0 refills | Status: DC | PRN

## 2016-04-20 MED ORDER — CITALOPRAM HYDROBROMIDE 20 MG PO TABS: 20.0000 mg | ORAL_TABLET | Freq: Every day | ORAL | 2 refills | Status: DC

## 2016-04-20 NOTE — Assessment & Plan Note (Signed)
Not significantly better. Will restart celexa and check in in about a month. Refill of klonopin given today.

## 2016-04-20 NOTE — Progress Notes (Signed)
BP 123/89 (BP Location: Left Arm, Patient Position: Sitting, Cuff Size: Normal)   Pulse 92   Temp 98.2 F (36.8 C)   Wt 156 lb (70.8 kg)   SpO2 97%   BMI 23.72 kg/m    Subjective:    Patient ID: Timothy Finley, male    DOB: Sep 04, 1970, 45 y.o.   MRN: [id number]  HPI: BENTLEIGH KRENGEL is a 45 y.o. male  Chief Complaint  Patient presents with  . Anxiety   ANXIETY/STRESS- has not been taking the citalopram because he was afraid of taking it while he was drinking Duration:uncontrolled Anxious mood: yes  Excessive worrying: yes Irritability: yes  Sweating: yes Nausea: yes Palpitations:yes Hyperventilation: yes Panic attacks: yes Agoraphobia: no  Obscessions/compulsions: no Depressed mood: yes Depression screen [facility] 2/9 04/20/2016 03/23/2016  Decreased Interest 1 1  Down, Depressed, Hopeless 1 1  PHQ - 2 Score 2 2  Altered sleeping 3 2  Tired, decreased energy 2 1  Change in appetite 0 0  Feeling bad or failure about yourself  0 1  Trouble concentrating 0 0  Moving slowly or fidgety/restless 0 0  Suicidal thoughts 0 0  PHQ-9 Score 7 6   GAD 7 : Generalized Anxiety Score 04/20/2016 03/23/2016  Nervous, Anxious, on Edge 3 3  Control/stop worrying 2 3  Worry too much - different things 2 3  Trouble relaxing 1 3  Restless 0 1  Easily annoyed or irritable 1 2  Afraid - awful might happen 1 0  Total GAD 7 Score 10 15  Anxiety Difficulty Somewhat difficult Somewhat difficult   Anhedonia: no Weight changes: no Insomnia: yes   Hypersomnia: no Fatigue/loss of energy: yes Feelings of worthlessness: yes Feelings of guilt: yes Impaired concentration/indecisiveness: yes Suicidal ideations: no  Crying spells: no Recent Stressors/Life Changes: yes   Relationship problems: yes   Family stress: yes     Financial stress: yes    Job stress: yes    Recent death/loss: no  Relevant past medical, surgical, family and social history reviewed and updated as indicated. Interim  medical history since our last visit reviewed. Allergies and medications reviewed and updated.  Review of Systems  Constitutional: Negative.   Respiratory: Negative.   Cardiovascular: Negative.   Psychiatric/Behavioral: Positive for decreased concentration and sleep disturbance. Negative for agitation, behavioral problems, confusion, dysphoric mood, hallucinations, self-injury and suicidal ideas. The patient is nervous/anxious. The patient is not hyperactive.     Per HPI unless specifically indicated above     Objective:    BP 123/89 (BP Location: Left Arm, Patient Position: Sitting, Cuff Size: Normal)   Pulse 92   Temp 98.2 F (36.8 C)   Wt 156 lb (70.8 kg)   SpO2 97%   BMI 23.72 kg/m   Wt Readings from Last 3 Encounters:  04/20/16 156 lb (70.8 kg)  04/01/16 155 lb (70.3 kg)  03/23/16 156 lb (70.8 kg)    Physical Exam  Constitutional: He is oriented to person, place, and time. He appears well-developed and well-nourished. No distress.  HENT:  Head: Normocephalic and atraumatic.  Right Ear: Hearing normal.  Left Ear: Hearing normal.  Nose: Nose normal.  Eyes: Conjunctivae and lids are normal. Right eye exhibits no discharge. Left eye exhibits no discharge. No scleral icterus.  Pulmonary/Chest: Effort normal. No respiratory distress.  Musculoskeletal: Normal range of motion.  Neurological: He is alert and oriented to person, place, and time.  Skin: Skin is warm, dry and intact. No rash  noted. No erythema. No pallor.  Psychiatric: He has a normal mood and affect. His speech is normal and behavior is normal. Judgment and thought content normal. Cognition and memory are normal.  Nursing note and vitals reviewed.   Results for orders placed or performed during the hospital encounter of 123456  Basic metabolic panel  Result Value Ref Range   Sodium 138 135 - 145 mmol/L   Potassium 4.4 3.5 - 5.1 mmol/L   Chloride 104 101 - 111 mmol/L   CO2 24 22 - 32 mmol/L   Glucose,  Bld 99 65 - 99 mg/dL   BUN 12 6 - 20 mg/dL   Creatinine, Ser 1.00 0.61 - 1.24 mg/dL   Calcium 9.3 8.9 - 10.3 mg/dL   GFR calc non Af Amer >60 >60 mL/min   GFR calc Af Amer >60 >60 mL/min   Anion gap 10 5 - 15  CBC  Result Value Ref Range   WBC 15.8 (H) 3.8 - 10.6 K/uL   RBC 4.60 4.40 - 5.90 MIL/uL   Hemoglobin 16.2 13.0 - 18.0 g/dL   HCT 45.9 40.0 - 52.0 %   MCV 99.8 80.0 - 100.0 fL   MCH 35.2 (H) 26.0 - 34.0 pg   MCHC 35.2 32.0 - 36.0 g/dL   RDW 13.9 11.5 - 14.5 %   Platelets 280 150 - 440 K/uL  Troponin I  Result Value Ref Range   Troponin I <0.03 <0.03 ng/mL      Assessment & Plan:   Problem List Items Addressed This Visit      Other   Panic disorder    Not significantly better. Will restart celexa and check in in about a month. Refill of klonopin given today.      Relevant Medications   citalopram (CELEXA) 20 MG tablet    Other Visit Diagnoses   None.      Follow up plan: Return in about 4 weeks (around 05/18/2016).

## 2016-05-18 ENCOUNTER — Ambulatory Visit (INDEPENDENT_AMBULATORY_CARE_PROVIDER_SITE_OTHER): Payer: BLUE CROSS/BLUE SHIELD | Admitting: Family Medicine

## 2016-05-18 ENCOUNTER — Encounter: Payer: Self-pay | Admitting: Family Medicine

## 2016-05-18 VITALS — BP 127/83 | HR 75 | Temp 97.9°F | Wt 160.1 lb

## 2016-05-18 DIAGNOSIS — H538 Other visual disturbances: Secondary | ICD-10-CM

## 2016-05-18 DIAGNOSIS — F410 Panic disorder [episodic paroxysmal anxiety] without agoraphobia: Secondary | ICD-10-CM

## 2016-05-18 NOTE — Assessment & Plan Note (Signed)
Improved. Continue current regimen. Does not want to increase medicine right now. Call with any concerns.

## 2016-05-18 NOTE — Progress Notes (Signed)
BP 127/83 (BP Location: Left Arm, Patient Position: Sitting, Cuff Size: Normal)   Pulse 75   Temp 97.9 F (36.6 C)   Wt 160 lb 1.6 oz (72.6 kg)   SpO2 98%   BMI 24.34 kg/m    Subjective:    Patient ID: Timothy Finley, male    DOB: May 29, 1971, 45 y.o.   MRN: [id number]  HPI: Timothy Finley is a 45 y.o. male  Chief Complaint  Patient presents with  . Anxiety   ANXIETY/STRESS Duration:better Anxious mood: yes  Excessive worrying: yes Irritability: no  Sweating: no Nausea: no Palpitations:no Hyperventilation: no Panic attacks: yes Agoraphobia: no  Obscessions/compulsions: no Depressed mood: no Depression screen [facility] 2/9 05/18/2016 04/20/2016 03/23/2016  Decreased Interest 1 1 1   Down, Depressed, Hopeless 1 1 1   PHQ - 2 Score 2 2 2   Altered sleeping - 3 2  Tired, decreased energy - 2 1  Change in appetite - 0 0  Feeling bad or failure about yourself  - 0 1  Trouble concentrating - 0 0  Moving slowly or fidgety/restless - 0 0  Suicidal thoughts - 0 0  PHQ-9 Score - 7 6   GAD 7 : Generalized Anxiety Score 05/18/2016 04/20/2016 03/23/2016  Nervous, Anxious, on Edge 1 3 3   Control/stop worrying 1 2 3   Worry too much - different things 1 2 3   Trouble relaxing 0 1 3  Restless 0 0 1  Easily annoyed or irritable 1 1 2   Afraid - awful might happen 1 1 0  Total GAD 7 Score 5 10 15   Anxiety Difficulty Somewhat difficult Somewhat difficult Somewhat difficult   Anhedonia: no Weight changes: no Insomnia: no   Hypersomnia: no Fatigue/loss of energy: no Feelings of worthlessness: no Feelings of guilt: no Impaired concentration/indecisiveness: no Suicidal ideations: no  Crying spells: no Recent Stressors/Life Changes: yes  Last time it happened he was watching TV and ended up with pain in his R eye and blurred vision that lasted about 45 minutes and then went away after taking a nap. Had some pain. No flashing lights. Has not happened again. No history of migraines.    Relevant past medical, surgical, family and social history reviewed and updated as indicated. Interim medical history since our last visit reviewed. Allergies and medications reviewed and updated.  Review of Systems  Constitutional: Negative.   Eyes: Positive for visual disturbance. Negative for photophobia, pain, discharge, redness and itching.  Respiratory: Negative.   Cardiovascular: Negative.   Neurological: Positive for headaches. Negative for dizziness, tremors, seizures, syncope, facial asymmetry, speech difficulty, weakness, light-headedness and numbness.  Psychiatric/Behavioral: Negative.     Per HPI unless specifically indicated above     Objective:    BP 127/83 (BP Location: Left Arm, Patient Position: Sitting, Cuff Size: Normal)   Pulse 75   Temp 97.9 F (36.6 C)   Wt 160 lb 1.6 oz (72.6 kg)   SpO2 98%   BMI 24.34 kg/m   Wt Readings from Last 3 Encounters:  05/18/16 160 lb 1.6 oz (72.6 kg)  04/20/16 156 lb (70.8 kg)  04/01/16 155 lb (70.3 kg)    Physical Exam  Constitutional: He is oriented to person, place, and time. He appears well-developed and well-nourished. No distress.  HENT:  Head: Normocephalic and atraumatic.  Right Ear: Hearing normal.  Left Ear: Hearing normal.  Nose: Nose normal.  Eyes: Conjunctivae and lids are normal. Right eye exhibits no discharge. Left eye exhibits no discharge. No  scleral icterus.  Cardiovascular: Normal rate, regular rhythm, normal heart sounds and intact distal pulses.  Exam reveals no gallop and no friction rub.   No murmur heard. Pulmonary/Chest: Effort normal and breath sounds normal. No respiratory distress. He has no wheezes. He has no rales. He exhibits no tenderness.  Musculoskeletal: Normal range of motion.  Neurological: He is alert and oriented to person, place, and time.  Skin: Skin is warm, dry and intact. No rash noted. No erythema. No pallor.  Psychiatric: He has a normal mood and affect. His speech is  normal and behavior is normal. Judgment and thought content normal. Cognition and memory are normal.  Vitals reviewed.   Results for orders placed or performed during the hospital encounter of 123456  Basic metabolic panel  Result Value Ref Range   Sodium 138 135 - 145 mmol/L   Potassium 4.4 3.5 - 5.1 mmol/L   Chloride 104 101 - 111 mmol/L   CO2 24 22 - 32 mmol/L   Glucose, Bld 99 65 - 99 mg/dL   BUN 12 6 - 20 mg/dL   Creatinine, Ser 1.00 0.61 - 1.24 mg/dL   Calcium 9.3 8.9 - 10.3 mg/dL   GFR calc non Af Amer >60 >60 mL/min   GFR calc Af Amer >60 >60 mL/min   Anion gap 10 5 - 15  CBC  Result Value Ref Range   WBC 15.8 (H) 3.8 - 10.6 K/uL   RBC 4.60 4.40 - 5.90 MIL/uL   Hemoglobin 16.2 13.0 - 18.0 g/dL   HCT 45.9 40.0 - 52.0 %   MCV 99.8 80.0 - 100.0 fL   MCH 35.2 (H) 26.0 - 34.0 pg   MCHC 35.2 32.0 - 36.0 g/dL   RDW 13.9 11.5 - 14.5 %   Platelets 280 150 - 440 K/uL  Troponin I  Result Value Ref Range   Troponin I <0.03 <0.03 ng/mL      Assessment & Plan:   Problem List Items Addressed This Visit      Other   Panic disorder - Primary    Improved. Continue current regimen. Does not want to increase medicine right now. Call with any concerns.        Other Visit Diagnoses    Blurred vision, right eye       Unclear etiology. Will get him into opthalmology for evaluation. Will return for full medical exam with labs. BP WNL, no hx of migraines.    Relevant Orders   Ambulatory referral to Ophthalmology       Follow up plan: Return early January, for Physical.

## 2016-08-06 ENCOUNTER — Encounter: Payer: BLUE CROSS/BLUE SHIELD | Admitting: Family Medicine

## 2016-08-28 ENCOUNTER — Telehealth: Payer: Self-pay | Admitting: Family Medicine

## 2016-08-28 NOTE — Telephone Encounter (Signed)
Left message for patient to call us back if he is out and needs it today. If not, advised him that we'd just refill it tomorrow at his appt.

## 2016-08-28 NOTE — Telephone Encounter (Signed)
Patient would like a refill on Klonopin.  Patient was not sure if he is required to have a follow up appt before you refill his RX. Patient scheduled an appt for 08/29/16 @ 11am.  Please call patient to advise.

## 2016-08-28 NOTE — Telephone Encounter (Signed)
Routing to provider  

## 2016-08-28 NOTE — Telephone Encounter (Signed)
Can you find out if he needs it before tomorrow or just wants to get it at his appointment.

## 2016-08-29 ENCOUNTER — Encounter: Payer: Self-pay | Admitting: Family Medicine

## 2016-08-29 ENCOUNTER — Telehealth: Payer: Self-pay

## 2016-08-29 ENCOUNTER — Ambulatory Visit (INDEPENDENT_AMBULATORY_CARE_PROVIDER_SITE_OTHER): Payer: BLUE CROSS/BLUE SHIELD | Admitting: Family Medicine

## 2016-08-29 VITALS — BP 128/87 | HR 114 | Temp 98.4°F | Ht 68.0 in | Wt 162.4 lb

## 2016-08-29 DIAGNOSIS — F410 Panic disorder [episodic paroxysmal anxiety] without agoraphobia: Secondary | ICD-10-CM | POA: Diagnosis not present

## 2016-08-29 MED ORDER — CLONAZEPAM 0.5 MG PO TABS: 0.5000 mg | ORAL_TABLET | Freq: Two times a day (BID) | ORAL | 1 refills | Status: DC | PRN

## 2016-08-29 NOTE — Telephone Encounter (Signed)
Opened in error

## 2016-08-29 NOTE — Progress Notes (Signed)
BP 128/87 (BP Location: Left Arm, Cuff Size: Normal)   Pulse (!) 114   Temp 98.4 F (36.9 C)   Ht 5\' 8"  (1.727 m)   Wt 162 lb 6.4 oz (73.7 kg)   SpO2 99%   BMI 24.69 kg/m    Subjective:    Patient ID: Timothy Finley, male    DOB: [birth year], 46 y.o.   MRN: [id number]  HPI: Timothy Finley is a 46 y.o. male  Chief Complaint  Patient presents with  . Anxiety   ANXIETY/STRESS- hasn't been taking his citalopram because he didn't feel like it was doing much for him. Had been taking his klonopin 1-2x a week.  Duration:exacerbated Anxious mood: yes  Excessive worrying: yes Irritability: yes  Sweating: no Nausea: no Palpitations:no Hyperventilation: no Panic attacks: yes- a couple of times this week Agoraphobia: no  Obscessions/compulsions: yes Depressed mood: yes Depression screen [facility] 2/9 08/29/2016 05/18/2016 04/20/2016 03/23/2016  Decreased Interest 2 1 1 1   Down, Depressed, Hopeless 1 1 1 1   PHQ - 2 Score 3 2 2 2   Altered sleeping 2 - 3 2  Tired, decreased energy 1 - 2 1  Change in appetite 0 - 0 0  Feeling bad or failure about yourself  1 - 0 1  Trouble concentrating 0 - 0 0  Moving slowly or fidgety/restless 0 - 0 0  Suicidal thoughts 0 - 0 0  PHQ-9 Score 7 - 7 6   GAD 7 : Generalized Anxiety Score 05/18/2016 04/20/2016 03/23/2016  Nervous, Anxious, on Edge 1 3 3   Control/stop worrying 1 2 3   Worry too much - different things 1 2 3   Trouble relaxing 0 1 3  Restless 0 0 1  Easily annoyed or irritable 1 1 2   Afraid - awful might happen 1 1 0  Total GAD 7 Score 5 10 15   Anxiety Difficulty Somewhat difficult Somewhat difficult Somewhat difficult   Anhedonia: no Weight changes: no Insomnia: yes hard to stay asleep  Hypersomnia: no Fatigue/loss of energy: yes Feelings of worthlessness: no Feelings of guilt: no Impaired concentration/indecisiveness: no Suicidal ideations: no  Crying spells: no Recent Stressors/Life Changes: yes   Relationship problems: no  Family stress: no     Financial stress: yes    Job stress: yes    Recent death/loss: no   Relevant past medical, surgical, family and social history reviewed and updated as indicated. Interim medical history since our last visit reviewed. Allergies and medications reviewed and updated.  Review of Systems  Constitutional: Negative.   Respiratory: Negative.   Cardiovascular: Negative.   Psychiatric/Behavioral: Negative for agitation, behavioral problems, confusion, decreased concentration, dysphoric mood, hallucinations, self-injury, sleep disturbance and suicidal ideas. The patient is nervous/anxious. The patient is not hyperactive.     Per HPI unless specifically indicated above     Objective:    BP 128/87 (BP Location: Left Arm, Cuff Size: Normal)   Pulse (!) 114   Temp 98.4 F (36.9 C)   Ht 5\' 8"  (1.727 m)   Wt 162 lb 6.4 oz (73.7 kg)   SpO2 99%   BMI 24.69 kg/m   Wt Readings from Last 3 Encounters:  08/29/16 162 lb 6.4 oz (73.7 kg)  05/18/16 160 lb 1.6 oz (72.6 kg)  04/20/16 156 lb (70.8 kg)    Physical Exam  Constitutional: He is oriented to person, place, and time. He appears well-developed and well-nourished. No distress.  HENT:  Head: Normocephalic and atraumatic.  Right Ear:  Hearing normal.  Left Ear: Hearing normal.  Nose: Nose normal.  Eyes: Conjunctivae and lids are normal. Right eye exhibits no discharge. Left eye exhibits no discharge. No scleral icterus.  Cardiovascular: Normal rate, regular rhythm, normal heart sounds and intact distal pulses.  Exam reveals no gallop and no friction rub.   No murmur heard. Pulmonary/Chest: Effort normal and breath sounds normal. No respiratory distress. He has no wheezes. He has no rales. He exhibits no tenderness.  Musculoskeletal: Normal range of motion.  Neurological: He is alert and oriented to person, place, and time.  Skin: Skin is warm, dry and intact. No rash noted. No erythema. No pallor.  Psychiatric: He has a  normal mood and affect. His speech is normal and behavior is normal. Judgment and thought content normal. Cognition and memory are normal.  Nursing note and vitals reviewed.   Results for orders placed or performed during the hospital encounter of 123456  Basic metabolic panel  Result Value Ref Range   Sodium 138 135 - 145 mmol/L   Potassium 4.4 3.5 - 5.1 mmol/L   Chloride 104 101 - 111 mmol/L   CO2 24 22 - 32 mmol/L   Glucose, Bld 99 65 - 99 mg/dL   BUN 12 6 - 20 mg/dL   Creatinine, Ser 1.00 0.61 - 1.24 mg/dL   Calcium 9.3 8.9 - 10.3 mg/dL   GFR calc non Af Amer >60 >60 mL/min   GFR calc Af Amer >60 >60 mL/min   Anion gap 10 5 - 15  CBC  Result Value Ref Range   WBC 15.8 (H) 3.8 - 10.6 K/uL   RBC 4.60 4.40 - 5.90 MIL/uL   Hemoglobin 16.2 13.0 - 18.0 g/dL   HCT 45.9 40.0 - 52.0 %   MCV 99.8 80.0 - 100.0 fL   MCH 35.2 (H) 26.0 - 34.0 pg   MCHC 35.2 32.0 - 36.0 g/dL   RDW 13.9 11.5 - 14.5 %   Platelets 280 150 - 440 K/uL  Troponin I  Result Value Ref Range   Troponin I <0.03 <0.03 ng/mL      Assessment & Plan:   Problem List Items Addressed This Visit      Other   Panic disorder - Primary    Did not feel any better on citalopram, so stopped it. Will continue him on klonopin, but Rx given today should last 6+ months, if he starts needing it more than 1-2x a week, will need preventative medicine and will call. Recheck 6 months at physical.           Follow up plan: Return in about 6 months (around 02/26/2017) for Physical.

## 2016-08-29 NOTE — Assessment & Plan Note (Signed)
Did not feel any better on citalopram, so stopped it. Will continue him on klonopin, but Rx given today should last 6+ months, if he starts needing it more than 1-2x a week, will need preventative medicine and will call. Recheck 6 months at physical.

## 2016-08-29 NOTE — Progress Notes (Deleted)
There were no vitals taken for this visit.   Subjective:    Patient ID: Timothy Finley, male    DOB: 1970-09-20, 46 y.o.   MRN: [id number]  HPI: Timothy Finley is a 46 y.o. male presenting on 08/29/2016 for comprehensive medical examination. Current medical complaints include:{Blank single:19197::"none","***"}  He currently lives with: Interim Problems from his last visit: {Blank single:19197::"yes","no"}  Depression Screen done today and results listed below:  Depression screen [facility] 2/9 05/18/2016 04/20/2016 03/23/2016  Decreased Interest 1 1 1   Down, Depressed, Hopeless 1 1 1   PHQ - 2 Score 2 2 2   Altered sleeping - 3 2  Tired, decreased energy - 2 1  Change in appetite - 0 0  Feeling bad or failure about yourself  - 0 1  Trouble concentrating - 0 0  Moving slowly or fidgety/restless - 0 0  Suicidal thoughts - 0 0  PHQ-9 Score - 7 6    The patient {has/does not have:19849} a history of falls. I {did/did not:19850} complete a risk assessment for falls. A plan of care for falls {was/was not:19852} documented.   Past Medical History:  Past Medical History:  Diagnosis Date  . Anxiety     Surgical History:  No past surgical history on file.  Medications:  Current Outpatient Prescriptions on File Prior to Visit  Medication Sig  . aspirin EC 81 MG tablet Take 81 mg by mouth daily.  . cetirizine (ZYRTEC ALLERGY) 10 MG tablet Take 10 mg by mouth daily.  . citalopram (CELEXA) 20 MG tablet Take 1 tablet (20 mg total) by mouth daily. 1/2 tab daily for 1 week, then 1 tab daily  . clonazePAM (KLONOPIN) 0.5 MG tablet Take 1 tablet (0.5 mg total) by mouth 2 (two) times daily as needed for anxiety.  Marland Kitchen PROAIR HFA 108 (90 Base) MCG/ACT inhaler INHALE 2 INHALATIONS INTO THE LUNGS EVERY 6 (SIX) HOURS AS NEEDED FOR WHEEZING.   No current facility-administered medications on file prior to visit.     Allergies:  No Known Allergies  Social History:  Social History   Social History    . Marital status: Married    Spouse name: N/A  . Number of children: N/A  . Years of education: N/A   Occupational History  . Not on file.   Social History Main Topics  . Smoking status: Current Every Day Smoker    Packs/day: 0.25    Types: Cigarettes  . Smokeless tobacco: Current User  . Alcohol use 8.4 oz/week    14 Cans of beer per week  . Drug use: Yes    Types: Marijuana  . Sexual activity: Yes    Birth control/ protection: None   Other Topics Concern  . Not on file   Social History Narrative  . No narrative on file   History  Smoking Status  . Current Every Day Smoker  . Packs/day: 0.25  . Types: Cigarettes  Smokeless Tobacco  . Current User   History  Alcohol Use  . 8.4 oz/week  . 14 Cans of beer per week    Family History:  Family History  Problem Relation Age of Onset  . Cancer Maternal Grandmother     Breast  . Cancer Maternal Grandfather   . Aneurysm Paternal Grandfather     Brain    Past medical history, surgical history, medications, allergies, family history and social history reviewed with patient today and changes made to appropriate areas of the chart.  Review of Systems - {ros master:310782} All other ROS negative except what is listed above and in the HPI.      Objective:    There were no vitals taken for this visit.  Wt Readings from Last 3 Encounters:  05/18/16 160 lb 1.6 oz (72.6 kg)  04/20/16 156 lb (70.8 kg)  04/01/16 155 lb (70.3 kg)    Physical Exam  Results for orders placed or performed during the hospital encounter of 123456  Basic metabolic panel  Result Value Ref Range   Sodium 138 135 - 145 mmol/L   Potassium 4.4 3.5 - 5.1 mmol/L   Chloride 104 101 - 111 mmol/L   CO2 24 22 - 32 mmol/L   Glucose, Bld 99 65 - 99 mg/dL   BUN 12 6 - 20 mg/dL   Creatinine, Ser 1.00 0.61 - 1.24 mg/dL   Calcium 9.3 8.9 - 10.3 mg/dL   GFR calc non Af Amer >60 >60 mL/min   GFR calc Af Amer >60 >60 mL/min   Anion gap 10 5 - 15   CBC  Result Value Ref Range   WBC 15.8 (H) 3.8 - 10.6 K/uL   RBC 4.60 4.40 - 5.90 MIL/uL   Hemoglobin 16.2 13.0 - 18.0 g/dL   HCT 45.9 40.0 - 52.0 %   MCV 99.8 80.0 - 100.0 fL   MCH 35.2 (H) 26.0 - 34.0 pg   MCHC 35.2 32.0 - 36.0 g/dL   RDW 13.9 11.5 - 14.5 %   Platelets 280 150 - 440 K/uL  Troponin I  Result Value Ref Range   Troponin I <0.03 <0.03 ng/mL      Assessment & Plan:   Problem List Items Addressed This Visit      Other   Panic disorder    Other Visit Diagnoses    Routine general medical examination at a health care facility    -  Primary       Discussed aspirin prophylaxis for myocardial infarction prevention and decision was {Blank single:19197::"it was not indicated","made to continue ASA","made to start ASA","made to stop ASA","that we recommended ASA, and patient refused"}  LABORATORY TESTING:  Health maintenance labs ordered today as discussed above.   The natural history of prostate cancer and ongoing controversy regarding screening and potential treatment outcomes of prostate cancer has been discussed with the patient. The meaning of a false positive PSA and a false negative PSA has been discussed. He indicates understanding of the limitations of this screening test and wishes *** to proceed with screening PSA testing.   IMMUNIZATIONS:   - Tdap: Tetanus vaccination status reviewed: {tetanus status:315746}. - Influenza: {Blank single:19197::"Up to date","Administered today","Postponed to flu season","Refused","Given elsewhere"} - Pneumovax: {Blank single:19197::"Up to date","Administered today","Not applicable","Refused","Given elsewhere"} - Prevnar: {Blank single:19197::"Up to date","Administered today","Not applicable","Refused","Given elsewhere"} - Zostavax vaccine: {Blank single:19197::"Up to date","Administered today","Not applicable","Refused","Given elsewhere"}  SCREENING: - Colonoscopy: {Blank single:19197::"Up to date","Ordered today","Not  applicable","Refused","Done elsewhere"}  Discussed with patient purpose of the colonoscopy is to detect colon cancer at curable precancerous or early stages   - AAA Screening: {Blank single:19197::"Up to date","Ordered today","Not applicable","Refused","Done elsewhere"}  -Hearing Test: {Blank single:19197::"Up to date","Ordered today","Not applicable","Refused","Done elsewhere"}  -Spirometry: {Blank single:19197::"Up to date","Ordered today","Not applicable","Refused","Done elsewhere"}   PATIENT COUNSELING:    Sexuality: Discussed sexually transmitted diseases, partner selection, use of condoms, avoidance of unintended pregnancy  and contraceptive alternatives.   Advised to avoid cigarette smoking.  I discussed with the patient that most people either abstain from alcohol or drink within  safe limits (<=14/week and <=4 drinks/occasion for males, <=7/weeks and <= 3 drinks/occasion for females) and that the risk for alcohol disorders and other health effects rises proportionally with the number of drinks per week and how often a drinker exceeds daily limits.  Discussed cessation/primary prevention of drug use and availability of treatment for abuse.   Diet: Encouraged to adjust caloric intake to maintain  or achieve ideal body weight, to reduce intake of dietary saturated fat and total fat, to limit sodium intake by avoiding high sodium foods and not adding table salt, and to maintain adequate dietary potassium and calcium preferably from fresh fruits, vegetables, and low-fat dairy products.    stressed the importance of regular exercise  Injury prevention: Discussed safety belts, safety helmets, smoke detector, smoking near bedding or upholstery.   Dental health: Discussed importance of regular tooth brushing, flossing, and dental visits.   Follow up plan: NEXT PREVENTATIVE PHYSICAL DUE IN 1 YEAR. No Follow-up on file.

## 2016-11-16 IMAGING — CR DG CHEST 2V
1 series · 2 of 2 positions shown · non-contrast
Comparison: None.

CLINICAL DATA: Chest pressure and shortness of breath today.

EXAM:
CHEST  2 VIEW

[Series 1: dg chest 2 view · 0.14mm/px · 2 of 2 slices shown]
[im 1/2]
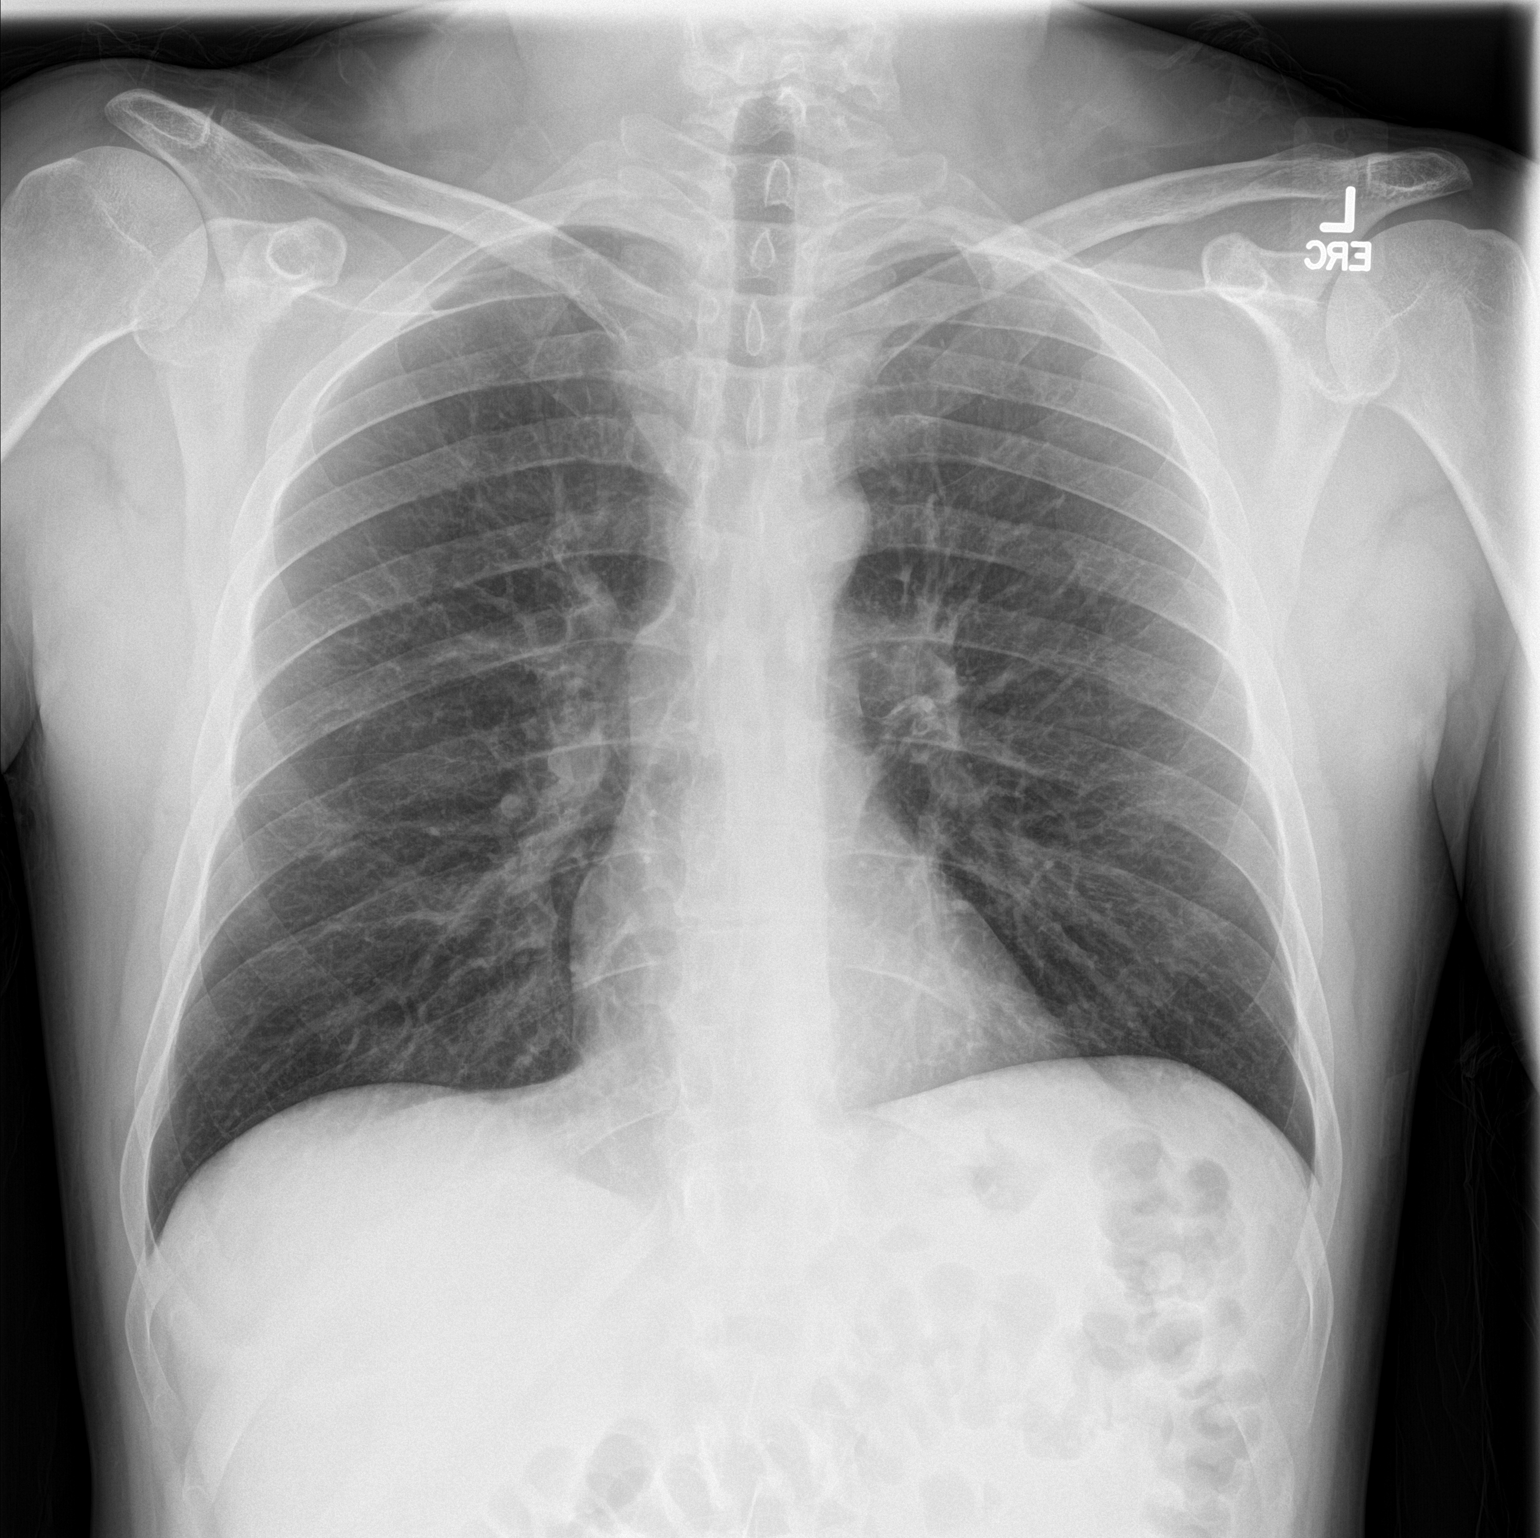
[im 2/2]
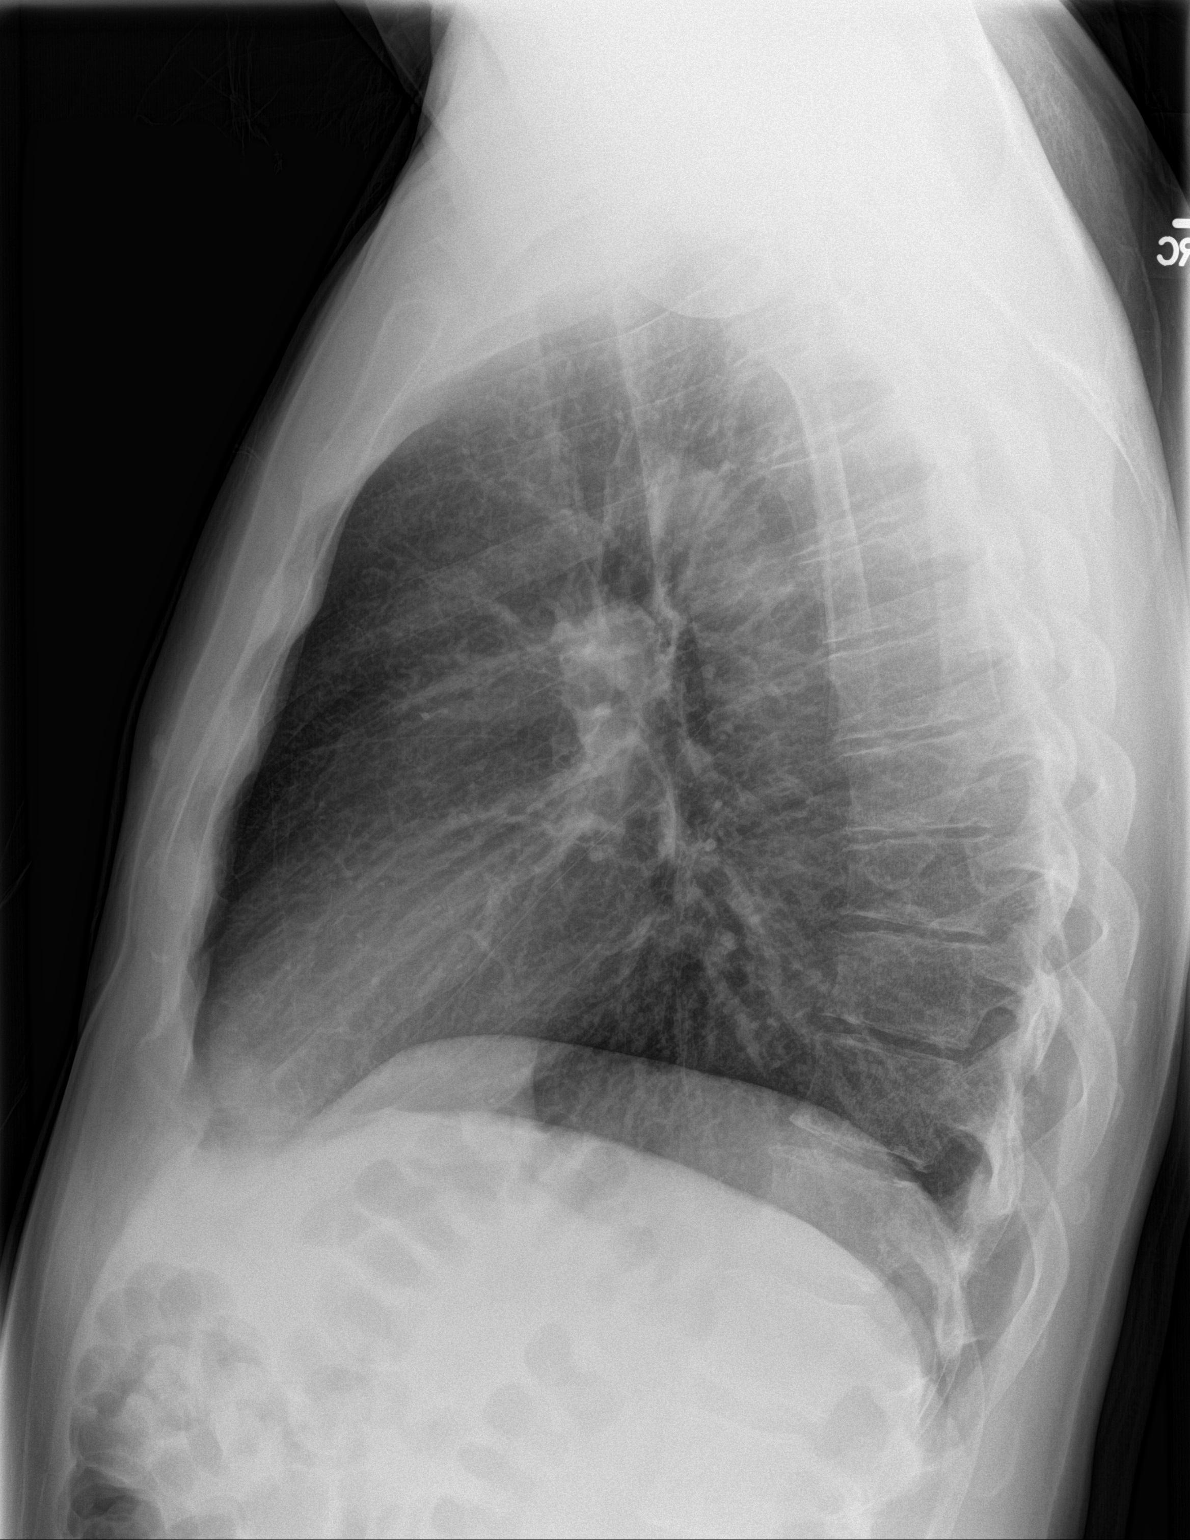

[2 of 2 positions shown; findings below may reference images not displayed]

FINDINGS: The lungs are clear. Heart size is normal. No pneumothorax or
pleural effusion. No focal bony abnormality.
IMPRESSION: No active cardiopulmonary disease.

## 2016-12-17 ENCOUNTER — Encounter: Payer: Self-pay | Admitting: Emergency Medicine

## 2016-12-17 ENCOUNTER — Emergency Department
Admission: EM | Admit: 2016-12-17 | Discharge: 2016-12-17 | Disposition: A | Discharge status: Home / Self Care | Payer: BLUE CROSS/BLUE SHIELD | Attending: Emergency Medicine | Admitting: Emergency Medicine

## 2016-12-17 DIAGNOSIS — N23 Unspecified renal colic: Secondary | ICD-10-CM | POA: Insufficient documentation

## 2016-12-17 DIAGNOSIS — R109 Unspecified abdominal pain: Secondary | ICD-10-CM

## 2016-12-17 DIAGNOSIS — R1032 Left lower quadrant pain: Secondary | ICD-10-CM | POA: Diagnosis present

## 2016-12-17 DIAGNOSIS — F17210 Nicotine dependence, cigarettes, uncomplicated: Secondary | ICD-10-CM | POA: Insufficient documentation

## 2016-12-17 HISTORY — DX: Calculus of kidney: N20.0

## 2016-12-17 LAB — URINALYSIS, COMPLETE (UACMP) WITH MICROSCOPIC
BACTERIA UA: NONE SEEN
Bilirubin Urine: NEGATIVE
Glucose, UA: NEGATIVE mg/dL
Ketones, ur: NEGATIVE mg/dL
Leukocytes, UA: NEGATIVE
NITRITE: NEGATIVE
PROTEIN: 100 mg/dL — AB
SPECIFIC GRAVITY, URINE: 1.016 (ref 1.005–1.030)
pH: 5 (ref 5.0–8.0)

## 2016-12-17 MED ORDER — NAPROXEN 500 MG PO TABS: 500.0000 mg | ORAL_TABLET | Freq: Two times a day (BID) | ORAL | 0 refills | Status: DC

## 2016-12-17 MED ORDER — ONDANSETRON 4 MG PO TBDP
4.0000 mg | ORAL_TABLET | Freq: Three times a day (TID) | ORAL | 0 refills | Status: DC | PRN
Start: 2016-12-17 — End: 2016-12-25

## 2016-12-17 NOTE — ED Provider Notes (Signed)
[facility] Emergency Department Provider Note  ____________________________________________  Time seen: Approximately 3:56 PM  I have reviewed the triage vital signs and the nursing notes.   HISTORY  Chief Complaint Flank Pain    HPI Timothy Finley is a 46 y.o. male who complains of left flank pain radiating to the left lower quadrant earlier today. It lasted for an hour 15 minutes and then while having dry heaves in the waiting room here at the emergency department his symptoms resolved. He had no recurrence of the symptoms since then. He reports that he is pain free and not nauseated. No fever or chills. No sweats. Pain was severe sharp without aggravating or alleviating factors when present.     Past Medical History:  Diagnosis Date  . Anxiety   . Kidney stones      Patient Active Problem List   Diagnosis Date Noted  . Panic disorder 03/23/2016  . BRONCHITIS, ALLERGIC 09/29/2010  . CIGARETTE SMOKER 06/26/2010  . ALLERGIC RHINITIS 06/26/2010     History reviewed. No pertinent surgical history.   Prior to Admission medications   Medication Sig Start Date End Date Taking? Authorizing Provider  aspirin EC 81 MG tablet Take 81 mg by mouth daily.    [provider]  cetirizine (ZYRTEC ALLERGY) 10 MG tablet Take 10 mg by mouth daily.    [provider]  clonazePAM (KLONOPIN) 0.5 MG tablet Take 1 tablet (0.5 mg total) by mouth 2 (two) times daily as needed for anxiety. 08/29/16   Johnson, Megan P, DO  naproxen (NAPROSYN) 500 MG tablet Take 1 tablet (500 mg total) by mouth 2 (two) times daily with a meal. 12/17/16   Carrie Mew, MD  ondansetron (ZOFRAN ODT) 4 MG disintegrating tablet Take 1 tablet (4 mg total) by mouth every 8 (eight) hours as needed for nausea or vomiting. 12/17/16   Carrie Mew, MD  PROAIR HFA 108 [phone] Base) MCG/ACT inhaler INHALE 2 INHALATIONS INTO THE LUNGS EVERY 6 (SIX) HOURS AS NEEDED FOR WHEEZING.  03/23/16   Park Liter P, DO     Allergies Patient has no known allergies.   Family History  Problem Relation Age of Onset  . Cancer Maternal Grandmother        Breast  . Cancer Maternal Grandfather   . Aneurysm Paternal Grandfather        Brain    Social History Social History  Substance Use Topics  . Smoking status: Current Every Day Smoker    Packs/day: 1.00    Types: Cigarettes  . Smokeless tobacco: Current User  . Alcohol use 8.4 oz/week    14 Cans of beer per week    Review of Systems  Constitutional:   No fever or chills.  ENT:   No sore throat. No rhinorrhea. Cardiovascular:   No chest pain or syncope. Respiratory:   No dyspnea or cough. Gastrointestinal:   Left flank pain as above. No vomiting or diarrhea.  Musculoskeletal:   Negative for focal pain or swelling All other systems reviewed and are negative except as documented above in ROS and HPI.  ____________________________________________   PHYSICAL EXAM:  VITAL SIGNS: ED Triage Vitals [12/17/16 1259]  Enc Vitals Group     BP (!) 145/86     Pulse Rate 70     Resp 20     Temp 97.9 F (36.6 C)     Temp Source Oral     SpO2 99 %     Weight  165 lb (74.8 kg)     Height 5\' 8"  (1.727 m)     Head Circumference      Peak Flow      Pain Score 2     Pain Loc      Pain Edu?      Excl. in [place]?     Vital signs reviewed, nursing assessments reviewed.   Constitutional:   Alert and oriented. Well appearing and in no distress. Eyes:   No scleral icterus. No conjunctival pallor. PERRL. EOMI.  No nystagmus. ENT   Head:   Normocephalic and atraumatic.   Nose:   No congestion/rhinnorhea.    Mouth/Throat:   MMM, no pharyngeal erythema. No peritonsillar mass.    Neck:   No meningismus. Full ROM Hematological/Lymphatic/Immunilogical:   No cervical lymphadenopathy. Cardiovascular:   RRR. Symmetric bilateral radial and DP pulses.  No murmurs.  Respiratory:   Normal respiratory effort without  tachypnea/retractions. Breath sounds are clear and equal bilaterally. No wheezes/rales/rhonchi. Gastrointestinal:   Soft and nontender. Non distended. There is no CVA tenderness.  No rebound, rigidity, or guarding. Genitourinary:   deferred Musculoskeletal:   Normal range of motion in all extremities. No joint effusions.  No lower extremity tenderness.  No edema. Neurologic:   Normal speech and language.  CN 2-10 normal. Motor grossly intact. No gross focal neurologic deficits are appreciated.  Skin:    Skin is warm, dry and intact. No rash noted.  No petechiae, purpura, or bullae.  ____________________________________________    LABS (pertinent positives/negatives) (all labs ordered are listed, but only abnormal results are displayed) Labs Reviewed  URINALYSIS, COMPLETE (UACMP) WITH MICROSCOPIC - Abnormal; Notable for the following:       Result Value   Color, Urine YELLOW (*)    APPearance CLOUDY (*)    Hgb urine dipstick LARGE (*)    Protein, ur 100 (*)    Squamous Epithelial / LPF 0-5 (*)    All other components within normal limits   ____________________________________________   EKG    ____________________________________________    RADIOLOGY  No results found.  ____________________________________________   PROCEDURES Procedures  ____________________________________________   INITIAL IMPRESSION / ASSESSMENT AND PLAN / ED COURSE  Pertinent labs & imaging results that were available during my care of the patient were reviewed by me and considered in my medical decision making (see chart for details).  Patient well appearing no acute distress. Presents with sudden onset of left flank pain consistent with renal colic which lasted for about an hour 15 minutes and then resolved. He is now well-appearing pain-free with unremarkable vital signs. He appears to be a small stone that passed spontaneously very quickly. Cautioned him that he may have a recurrence if he  has a stone that is slowly migrating, but since he has become pain free in the interim I highly doubt that he has an obstructing stone. Urinalysis not consistent with infection. No evidence of pyelonephritis or sepsis. Low suspicion for renal failure. We'll discharge home with symptomatic control as needed. Follow up with primary care.Considering the patient's symptoms, medical history, and physical examination today, I have low suspicion for cholecystitis or biliary pathology, pancreatitis, perforation or bowel obstruction, hernia, intra-abdominal abscess, AAA or dissection, volvulus or intussusception, mesenteric ischemia, or appendicitis.       ____________________________________________   FINAL CLINICAL IMPRESSION(S) / ED DIAGNOSES  Final diagnoses:  Left flank pain  Ureteral colic      New Prescriptions   NAPROXEN (NAPROSYN) 500 MG TABLET  Take 1 tablet (500 mg total) by mouth 2 (two) times daily with a meal.   ONDANSETRON (ZOFRAN ODT) 4 MG DISINTEGRATING TABLET    Take 1 tablet (4 mg total) by mouth every 8 (eight) hours as needed for nausea or vomiting.     Portions of this note were generated with dragon dictation software. Dictation errors may occur despite best attempts at proofreading.    Carrie Mew, MD 12/17/16 [phone]

## 2016-12-17 NOTE — ED Triage Notes (Signed)
States about 1145 today began L flank pain like kprevious kidney stones. While in lobby pain subsided to mild. Denies fevers.

## 2016-12-25 ENCOUNTER — Ambulatory Visit (INDEPENDENT_AMBULATORY_CARE_PROVIDER_SITE_OTHER): Payer: BLUE CROSS/BLUE SHIELD | Admitting: Family Medicine

## 2016-12-25 ENCOUNTER — Encounter: Payer: Self-pay | Admitting: Family Medicine

## 2016-12-25 VITALS — BP 138/88 | HR 91 | Temp 98.1°F | Ht 68.2 in | Wt 162.0 lb

## 2016-12-25 DIAGNOSIS — F17200 Nicotine dependence, unspecified, uncomplicated: Secondary | ICD-10-CM

## 2016-12-25 DIAGNOSIS — Z0000 Encounter for general adult medical examination without abnormal findings: Secondary | ICD-10-CM | POA: Diagnosis not present

## 2016-12-25 DIAGNOSIS — F410 Panic disorder [episodic paroxysmal anxiety] without agoraphobia: Secondary | ICD-10-CM

## 2016-12-25 DIAGNOSIS — J302 Other seasonal allergic rhinitis: Secondary | ICD-10-CM

## 2016-12-25 LAB — UA/M W/RFLX CULTURE, ROUTINE
Bilirubin, UA: NEGATIVE
Glucose, UA: NEGATIVE
KETONES UA: NEGATIVE
Leukocytes, UA: NEGATIVE
NITRITE UA: NEGATIVE
PH UA: 5.5 (ref 5.0–7.5)
RBC, UA: NEGATIVE
Specific Gravity, UA: 1.025 (ref 1.005–1.030)
Urobilinogen, Ur: 0.2 mg/dL (ref 0.2–1.0)

## 2016-12-25 LAB — MICROSCOPIC EXAMINATION

## 2016-12-25 MED ORDER — CLONAZEPAM 0.5 MG PO TABS: 0.5000 mg | ORAL_TABLET | Freq: Two times a day (BID) | ORAL | 1 refills | Status: DC | PRN

## 2016-12-25 MED ORDER — PROAIR HFA 108 (90 BASE) MCG/ACT IN AERS: INHALATION_SPRAY | RESPIRATORY_TRACT | 6 refills | Status: DC

## 2016-12-25 MED ORDER — CITALOPRAM HYDROBROMIDE 20 MG PO TABS: 20.0000 mg | ORAL_TABLET | Freq: Every day | ORAL | 3 refills | Status: DC

## 2016-12-25 NOTE — Progress Notes (Signed)
BP 138/88 (BP Location: Left Arm, Patient Position: Sitting, Cuff Size: Normal)   Pulse 91   Temp 98.1 F (36.7 C)   Ht 5' 8.2" (1.732 m)   Wt 162 lb (73.5 kg)   SpO2 99%   BMI 24.49 kg/m    Subjective:    Patient ID: Timothy Finley, male    DOB: Jun 02, 1971, 46 y.o.   MRN: [id number]  HPI: Timothy Finley is a 46 y.o. male presenting on 12/25/2016 for comprehensive medical examination. Current medical complaints include:  ANXIETY/STRESS- having  Duration:stable Anxious mood: yes  Excessive worrying: no Irritability: no  Sweating: no Nausea: no Palpitations:no Hyperventilation: no Panic attacks: yes Agoraphobia: no  Obscessions/compulsions: no Depressed mood: no Depression screen [facility] 2/9 12/25/2016 08/29/2016 05/18/2016 04/20/2016 03/23/2016  Decreased Interest 0 2 1 1 1   Down, Depressed, Hopeless 0 1 1 1 1   PHQ - 2 Score 0 3 2 2 2   Altered sleeping - 2 - 3 2  Tired, decreased energy - 1 - 2 1  Change in appetite - 0 - 0 0  Feeling bad or failure about yourself  - 1 - 0 1  Trouble concentrating - 0 - 0 0  Moving slowly or fidgety/restless - 0 - 0 0  Suicidal thoughts - 0 - 0 0  PHQ-9 Score - 7 - 7 6   Anhedonia: no Weight changes: no Insomnia: no   Hypersomnia: no Fatigue/loss of energy: no Feelings of worthlessness: no Feelings of guilt: no Impaired concentration/indecisiveness: no Suicidal ideations: no  Crying spells: no Recent Stressors/Life Changes: yes  Interim Problems from his last visit: no  Depression Screen done today and results listed below:  Depression screen [facility] 2/9 12/25/2016 08/29/2016 05/18/2016 04/20/2016 03/23/2016  Decreased Interest 0 2 1 1 1   Down, Depressed, Hopeless 0 1 1 1 1   PHQ - 2 Score 0 3 2 2 2   Altered sleeping - 2 - 3 2  Tired, decreased energy - 1 - 2 1  Change in appetite - 0 - 0 0  Feeling bad or failure about yourself  - 1 - 0 1  Trouble concentrating - 0 - 0 0  Moving slowly or fidgety/restless - 0 - 0 0  Suicidal  thoughts - 0 - 0 0  PHQ-9 Score - 7 - 7 6    Past Medical History:  Past Medical History:  Diagnosis Date  . Anxiety   . Kidney stones     Surgical History:  History reviewed. No pertinent surgical history.  Medications:  Current Outpatient Prescriptions on File Prior to Visit  Medication Sig  . aspirin EC 81 MG tablet Take 81 mg by mouth daily.  . cetirizine (ZYRTEC ALLERGY) 10 MG tablet Take 10 mg by mouth daily.   No current facility-administered medications on file prior to visit.     Allergies:  No Known Allergies  Social History:  Social History   Social History  . Marital status: Married    Spouse name: N/A  . Number of children: N/A  . Years of education: N/A   Occupational History  . Not on file.   Social History Main Topics  . Smoking status: Current Every Day Smoker    Packs/day: 1.00    Types: Cigarettes  . Smokeless tobacco: Current User  . Alcohol use 8.4 oz/week    14 Cans of beer per week  . Drug use: Yes    Types: Marijuana  . Sexual activity: Yes  Birth control/ protection: None   Other Topics Concern  . Not on file   Social History Narrative  . No narrative on file   History  Smoking Status  . Current Every Day Smoker  . Packs/day: 1.00  . Types: Cigarettes  Smokeless Tobacco  . Current User   History  Alcohol Use  . 8.4 oz/week  . 14 Cans of beer per week    Family History:  Family History  Problem Relation Age of Onset  . Cancer Maternal Grandmother        Breast  . Cancer Maternal Grandfather   . Aneurysm Paternal Grandfather        Brain    Past medical history, surgical history, medications, allergies, family history and social history reviewed with patient today and changes made to appropriate areas of the chart.   Review of Systems  Constitutional: Negative.   HENT: Negative.   Eyes: Negative.   Respiratory: Positive for cough, shortness of breath and wheezing. Negative for hemoptysis and sputum  production.   Cardiovascular: Negative.   Gastrointestinal: Positive for abdominal pain and heartburn. Negative for blood in stool, constipation, diarrhea, melena, nausea and vomiting.  Genitourinary: Negative.   Musculoskeletal: Negative.   Skin: Negative.   Neurological: Negative.   Endo/Heme/Allergies: Negative.   Psychiatric/Behavioral: Negative for depression, hallucinations, memory loss, substance abuse and suicidal ideas. The patient is nervous/anxious. The patient does not have insomnia.     All other ROS negative except what is listed above and in the HPI.      Objective:    BP 138/88 (BP Location: Left Arm, Patient Position: Sitting, Cuff Size: Normal)   Pulse 91   Temp 98.1 F (36.7 C)   Ht 5' 8.2" (1.732 m)   Wt 162 lb (73.5 kg)   SpO2 99%   BMI 24.49 kg/m   Wt Readings from Last 3 Encounters:  12/25/16 162 lb (73.5 kg)  12/17/16 165 lb (74.8 kg)  08/29/16 162 lb 6.4 oz (73.7 kg)    Physical Exam  Constitutional: He is oriented to person, place, and time. He appears well-developed and well-nourished. No distress.  HENT:  Head: Normocephalic and atraumatic.  Right Ear: Hearing, tympanic membrane, external ear and ear canal normal.  Left Ear: Hearing, tympanic membrane, external ear and ear canal normal.  Nose: Nose normal.  Mouth/Throat: Uvula is midline, oropharynx is clear and moist and mucous membranes are normal. No oropharyngeal exudate.  Eyes: Conjunctivae, EOM and lids are normal. Pupils are equal, round, and reactive to light. Right eye exhibits no discharge. Left eye exhibits no discharge. No scleral icterus.  Neck: Normal range of motion. Neck supple. No JVD present. No tracheal deviation present. No thyromegaly present.  Cardiovascular: Normal rate, regular rhythm, normal heart sounds and intact distal pulses.  Exam reveals no gallop and no friction rub.   No murmur heard. Pulmonary/Chest: Effort normal and breath sounds normal. No stridor. No  respiratory distress. He has no wheezes. He has no rales. He exhibits no tenderness.  Abdominal: Soft. Bowel sounds are normal. He exhibits no distension and no mass. There is no tenderness. There is no rebound and no guarding. Hernia confirmed negative in the right inguinal area and confirmed negative in the left inguinal area.  Genitourinary: Testes normal and penis normal. Cremasteric reflex is present. Right testis shows no mass, no swelling and no tenderness. Right testis is descended. Cremasteric reflex is not absent on the right side. Left testis shows no mass, no  swelling and no tenderness. Left testis is descended. Cremasteric reflex is not absent on the left side. Circumcised. No phimosis, paraphimosis, hypospadias, penile erythema or penile tenderness. No discharge found.  Musculoskeletal: Normal range of motion. He exhibits no edema, tenderness or deformity.  Lymphadenopathy:    He has no cervical adenopathy.  Neurological: He is alert and oriented to person, place, and time. He has normal reflexes. He displays normal reflexes. No cranial nerve deficit. He exhibits normal muscle tone. Coordination normal.  Skin: Skin is warm, dry and intact. No rash noted. He is not diaphoretic. No erythema. No pallor.  Psychiatric: He has a normal mood and affect. His speech is normal and behavior is normal. Judgment and thought content normal. Cognition and memory are normal.  Nursing note and vitals reviewed.   Results for orders placed or performed during the hospital encounter of 12/17/16  Urinalysis, Complete w Microscopic  Result Value Ref Range   Color, Urine YELLOW (A) YELLOW   APPearance CLOUDY (A) CLEAR   Specific Gravity, Urine 1.016 1.005 - 1.030   pH 5.0 5.0 - 8.0   Glucose, UA NEGATIVE NEGATIVE mg/dL   Hgb urine dipstick LARGE (A) NEGATIVE   Bilirubin Urine NEGATIVE NEGATIVE   Ketones, ur NEGATIVE NEGATIVE mg/dL   Protein, ur 100 (A) NEGATIVE mg/dL   Nitrite NEGATIVE NEGATIVE    Leukocytes, UA NEGATIVE NEGATIVE   RBC / HPF TOO NUMEROUS TO COUNT 0 - 5 RBC/hpf   WBC, UA 6-30 0 - 5 WBC/hpf   Bacteria, UA NONE SEEN NONE SEEN   Squamous Epithelial / LPF 0-5 (A) NONE SEEN   Mucous PRESENT       Assessment & Plan:   Problem List Items Addressed This Visit      Respiratory   Allergic rhinitis    Stable. Continue current regimen.         Other   CIGARETTE SMOKER    Not ready to quit right now. Continue to monitor.       Panic disorder    Not under good control. Needing to take his clonazepam every other day. Will start preventative. Recheck 1 month.       Other Visit Diagnoses    Routine general medical examination at a health care facility    -  Primary   Vaccines up to date. Screening labs checked today. Continue diet and exercise. Call with any concerns.   Relevant Orders   CBC with Differential/Platelet   Comprehensive metabolic panel   Lipid Panel w/o Chol/HDL Ratio   TSH   UA/M w/rflx Culture, Routine       Discussed aspirin prophylaxis for myocardial infarction prevention and decision was it was not indicated  LABORATORY TESTING:  Health maintenance labs ordered today as discussed above.   IMMUNIZATIONS:   - Tdap: Tetanus vaccination status reviewed: last tetanus booster within 10 years. - Influenza: Postponed to flu season - Pneumovax: Refused  PATIENT COUNSELING:    Sexuality: Discussed sexually transmitted diseases, partner selection, use of condoms, avoidance of unintended pregnancy  and contraceptive alternatives.   Advised to avoid cigarette smoking.  I discussed with the patient that most people either abstain from alcohol or drink within safe limits (<=14/week and <=4 drinks/occasion for males, <=7/weeks and <= 3 drinks/occasion for females) and that the risk for alcohol disorders and other health effects rises proportionally with the number of drinks per week and how often a drinker exceeds daily limits.  Discussed  cessation/primary prevention of drug use  and availability of treatment for abuse.   Diet: Encouraged to adjust caloric intake to maintain  or achieve ideal body weight, to reduce intake of dietary saturated fat and total fat, to limit sodium intake by avoiding high sodium foods and not adding table salt, and to maintain adequate dietary potassium and calcium preferably from fresh fruits, vegetables, and low-fat dairy products.    stressed the importance of regular exercise  Injury prevention: Discussed safety belts, safety helmets, smoke detector, smoking near bedding or upholstery.   Dental health: Discussed importance of regular tooth brushing, flossing, and dental visits.   Follow up plan: NEXT PREVENTATIVE PHYSICAL DUE IN 1 YEAR. Return 4-6 weeks, for follow up anxiety.

## 2016-12-25 NOTE — Assessment & Plan Note (Signed)
Not under good control. Needing to take his clonazepam every other day. Will start preventative. Recheck 1 month.

## 2016-12-25 NOTE — Assessment & Plan Note (Signed)
Not ready to quit right now. Continue to monitor.

## 2016-12-25 NOTE — Assessment & Plan Note (Signed)
Stable  Continue current regimen  

## 2016-12-25 NOTE — Patient Instructions (Addendum)
 Health Maintenance, Male A healthy lifestyle and preventive care is important for your health and wellness. Ask your health care provider about what schedule of regular examinations is right for you. What should I know about weight and diet?  Eat a Healthy Diet  Eat plenty of vegetables, fruits, whole grains, low-fat dairy products, and lean protein.  Do not eat a lot of foods high in solid fats, added sugars, or salt. Maintain a Healthy Weight  Regular exercise can help you achieve or maintain a healthy weight. You should:  Do at least 150 minutes of exercise each week. The exercise should increase your heart rate and make you sweat (moderate-intensity exercise).  Do strength-training exercises at least twice a week. Watch Your Levels of Cholesterol and Blood Lipids  Have your blood tested for lipids and cholesterol every 5 years starting at 45 years of age. If you are at high risk for heart disease, you should start having your blood tested when you are 45 years old. You may need to have your cholesterol levels checked more often if:  Your lipid or cholesterol levels are high.  You are older than 45 years of age.  You are at high risk for heart disease. What should I know about cancer screening? Many types of cancers can be detected early and may often be prevented. Lung Cancer  You should be screened every year for lung cancer if:  You are a current smoker who has smoked for at least 30 years.  You are a former smoker who has quit within the past 15 years.  Talk to your health care provider about your screening options, when you should start screening, and how often you should be screened. Colorectal Cancer  Routine colorectal cancer screening usually begins at 45 years of age and should be repeated every 5-10 years until you are 45 years old. You may need to be screened more often if early forms of precancerous polyps or small growths are found. Your health care provider  may recommend screening at an earlier age if you have risk factors for colon cancer.  Your health care provider may recommend using home test kits to check for hidden blood in the stool.  A small camera at the end of a tube can be used to examine your colon (sigmoidoscopy or colonoscopy). This checks for the earliest forms of colorectal cancer. Prostate and Testicular Cancer  Depending on your age and overall health, your health care provider may do certain tests to screen for prostate and testicular cancer.  Talk to your health care provider about any symptoms or concerns you have about testicular or prostate cancer. Skin Cancer  Check your skin from head to toe regularly.  Tell your health care provider about any new moles or changes in moles, especially if:  There is a change in a mole's size, shape, or color.  You have a mole that is larger than a pencil eraser.  Always use sunscreen. Apply sunscreen liberally and repeat throughout the day.  Protect yourself by wearing long sleeves, pants, a wide-brimmed hat, and sunglasses when outside. What should I know about heart disease, diabetes, and high blood pressure?  If you are 18-39 years of age, have your blood pressure checked every 3-5 years. If you are 40 years of age or older, have your blood pressure checked every year. You should have your blood pressure measured twice-once when you are at a hospital or clinic, and once when you are not at   a hospital or clinic. Record the average of the two measurements. To check your blood pressure when you are not at a hospital or clinic, you can use:  An automated blood pressure machine at a pharmacy.  A home blood pressure monitor.  Talk to your health care provider about your target blood pressure.  If you are between 45-79 years old, ask your health care provider if you should take aspirin to prevent heart disease.  Have regular diabetes screenings by checking your fasting blood sugar  level.  If you are at a normal weight and have a low risk for diabetes, have this test once every three years after the age of 45.  If you are overweight and have a high risk for diabetes, consider being tested at a younger age or more often.  A one-time screening for abdominal aortic aneurysm (AAA) by ultrasound is recommended for men aged 65-75 years who are current or former smokers. What should I know about preventing infection? Hepatitis B  If you have a higher risk for hepatitis B, you should be screened for this virus. Talk with your health care provider to find out if you are at risk for hepatitis B infection. Hepatitis C  Blood testing is recommended for:  Everyone born from 1945 through 1965.  Anyone with known risk factors for hepatitis C. Sexually Transmitted Diseases (STDs)  You should be screened each year for STDs including gonorrhea and chlamydia if:  You are sexually active and are younger than 45 years of age.  You are older than 45 years of age and your health care provider tells you that you are at risk for this type of infection.  Your sexual activity has changed since you were last screened and you are at an increased risk for chlamydia or gonorrhea. Ask your health care provider if you are at risk.  Talk with your health care provider about whether you are at high risk of being infected with HIV. Your health care provider may recommend a prescription medicine to help prevent HIV infection. What else can I do?  Schedule regular health, dental, and eye exams.  Stay current with your vaccines (immunizations).  Do not use any tobacco products, such as cigarettes, chewing tobacco, and e-cigarettes. If you need help quitting, ask your health care provider.  Limit alcohol intake to no more than 2 drinks per day. One drink equals 12 ounces of beer, 5 ounces of wine, or 1 ounces of hard liquor.  Do not use street drugs.  Do not share needles.  Ask your health  care provider for help if you need support or information about quitting drugs.  Tell your health care provider if you often feel depressed.  Tell your health care provider if you have ever been abused or do not feel safe at home. This information is not intended to replace advice given to you by your health care provider. Make sure you discuss any questions you have with your health care provider. Document Released: 01/12/2008 Document Revised: 03/14/2016 Document Reviewed: 04/19/2015 Elsevier Interactive Patient Education  2017 [employer]  

## 2016-12-26 ENCOUNTER — Telehealth: Payer: Self-pay | Admitting: Family Medicine

## 2016-12-26 DIAGNOSIS — E782 Mixed hyperlipidemia: Secondary | ICD-10-CM

## 2016-12-26 DIAGNOSIS — E785 Hyperlipidemia, unspecified: Secondary | ICD-10-CM | POA: Insufficient documentation

## 2016-12-26 LAB — CBC WITH DIFFERENTIAL/PLATELET
BASOS ABS: 0.1 10*3/uL (ref 0.0–0.2)
BASOS: 1 %
EOS (ABSOLUTE): 0.4 10*3/uL (ref 0.0–0.4)
EOS: 5 %
HEMATOCRIT: 45.1 % (ref 37.5–51.0)
Hemoglobin: 15.7 g/dL (ref 13.0–17.7)
IMMATURE GRANS (ABS): 0.4 10*3/uL — AB (ref 0.0–0.1)
Immature Granulocytes: 4 %
LYMPHS: 33 %
Lymphocytes Absolute: 2.9 10*3/uL (ref 0.7–3.1)
MCH: 34.4 pg — AB (ref 26.6–33.0)
MCHC: 34.8 g/dL (ref 31.5–35.7)
MCV: 99 fL — ABNORMAL HIGH (ref 79–97)
MONOCYTES: 11 %
Monocytes Absolute: 0.9 10*3/uL (ref 0.1–0.9)
NEUTROS ABS: 4.1 10*3/uL (ref 1.4–7.0)
Neutrophils: 46 %
Platelets: 284 10*3/uL (ref 150–379)
RBC: 4.57 x10E6/uL (ref 4.14–5.80)
RDW: 13.8 % (ref 12.3–15.4)
WBC: 8.7 10*3/uL (ref 3.4–10.8)

## 2016-12-26 LAB — COMPREHENSIVE METABOLIC PANEL
ALBUMIN: 4.3 g/dL (ref 3.5–5.5)
ALT: 50 IU/L — ABNORMAL HIGH (ref 0–44)
AST: 35 IU/L (ref 0–40)
Albumin/Globulin Ratio: 1.6 (ref 1.2–2.2)
Alkaline Phosphatase: 89 IU/L (ref 39–117)
BUN/Creatinine Ratio: 13 (ref 9–20)
BUN: 11 mg/dL (ref 6–24)
CHLORIDE: 99 mmol/L (ref 96–106)
CO2: 22 mmol/L (ref 18–29)
CREATININE: 0.83 mg/dL (ref 0.76–1.27)
Calcium: 9.7 mg/dL (ref 8.7–10.2)
GFR calc Af Amer: 123 mL/min/{1.73_m2} (ref >59)
GFR calc non Af Amer: 106 mL/min/{1.73_m2} (ref >59)
GLUCOSE: 108 mg/dL — AB (ref 65–99)
Globulin, Total: 2.7 g/dL (ref 1.5–4.5)
POTASSIUM: 4.3 mmol/L (ref 3.5–5.2)
SODIUM: 140 mmol/L (ref 134–144)
Total Protein: 7 g/dL (ref 6.0–8.5)

## 2016-12-26 LAB — LIPID PANEL W/O CHOL/HDL RATIO
CHOLESTEROL TOTAL: 321 mg/dL — AB (ref 100–199)
HDL: 37 mg/dL — AB (ref >39)
TRIGLYCERIDES: 1513 mg/dL — AB (ref 0–149)

## 2016-12-26 LAB — TSH: TSH: 3.3 u[IU]/mL (ref 0.450–4.500)

## 2016-12-26 NOTE — Telephone Encounter (Signed)
Called [place] about his labs. Everything looks pretty good except his cholesterol which was very very high. I know he wasn't fasting, so I'd like him to come in and give a fasting sample. Order placed.

## 2017-01-25 ENCOUNTER — Ambulatory Visit: Payer: BLUE CROSS/BLUE SHIELD | Admitting: Family Medicine

## 2017-02-07 NOTE — Progress Notes (Signed)
BP 138/89   Pulse 89   Temp 98.7 F (37.1 C)   Wt 161 lb 9.6 oz (73.3 kg)   SpO2 97%   BMI 24.43 kg/m    Subjective:    Patient ID: Timothy Finley, male    DOB: July 01, 1971, 46 y.o.   MRN: [id number]  HPI: ASHER Finley is a 46 y.o. male  Chief Complaint  Patient presents with  . Anxiety  . Hyperlipidemia   ANXIETY/STRESS- has been out of his meds for about 2 weeks, but was doing really well prior to that. Still has a lot of his klonopin left.  Duration:stable Anxious mood: no  Excessive worrying: no Irritability: no  Sweating: no Nausea: no Palpitations:no Hyperventilation: no Panic attacks: no Agoraphobia: no  Obscessions/compulsions: no Depressed mood: no Depression screen [facility] 2/9 02/11/2017 12/25/2016 08/29/2016 05/18/2016 04/20/2016  Decreased Interest 1 0 2 1 1   Down, Depressed, Hopeless 1 0 1 1 1   PHQ - 2 Score 2 0 3 2 2   Altered sleeping 1 - 2 - 3  Tired, decreased energy 1 - 1 - 2  Change in appetite 0 - 0 - 0  Feeling bad or failure about yourself  0 - 1 - 0  Trouble concentrating 0 - 0 - 0  Moving slowly or fidgety/restless 0 - 0 - 0  Suicidal thoughts 0 - 0 - 0  PHQ-9 Score 4 - 7 - 7   GAD 7 : Generalized Anxiety Score 02/11/2017 05/18/2016 04/20/2016 03/23/2016  Nervous, Anxious, on Edge 1 1 3 3   Control/stop worrying 0 1 2 3   Worry too much - different things 1 1 2 3   Trouble relaxing 0 0 1 3  Restless 0 0 0 1  Easily annoyed or irritable 1 1 1 2   Afraid - awful might happen 1 1 1  0  Total GAD 7 Score 4 5 10 15   Anxiety Difficulty - Somewhat difficult Somewhat difficult Somewhat difficult   Anhedonia: no Weight changes: no Insomnia: no   Hypersomnia: no Fatigue/loss of energy: no Feelings of worthlessness: no Feelings of guilt: no Impaired concentration/indecisiveness: no Suicidal ideations: no  Crying spells: no Recent Stressors/Life Changes: no  HYPERLIPIDEMIA Hyperlipidemia status: Here for fasting recheck, not on any meds right  now Satisfied with current treatment?  no none Supplements: none Aspirin:  no The ASCVD Risk score [name], et al., 2013) failed to calculate for the following reasons:   The valid total cholesterol range is 130 to 320 mg/dL Chest pain:  no Coronary artery disease:  no Family history CAD:  yes  Relevant past medical, surgical, family and social history reviewed and updated as indicated. Interim medical history since our last visit reviewed. Allergies and medications reviewed and updated.  Review of Systems  Constitutional: Negative.   Respiratory: Negative.   Cardiovascular: Negative.   Psychiatric/Behavioral: Negative.     Per HPI unless specifically indicated above     Objective:    BP 138/89   Pulse 89   Temp 98.7 F (37.1 C)   Wt 161 lb 9.6 oz (73.3 kg)   SpO2 97%   BMI 24.43 kg/m   Wt Readings from Last 3 Encounters:  02/11/17 161 lb 9.6 oz (73.3 kg)  12/25/16 162 lb (73.5 kg)  12/17/16 165 lb (74.8 kg)    Physical Exam  Constitutional: He is oriented to person, place, and time. He appears well-developed and well-nourished. No distress.  HENT:  Head: Normocephalic and atraumatic.  Right Ear: Hearing normal.  Left Ear: Hearing normal.  Nose: Nose normal.  Eyes: Conjunctivae and lids are normal. Right eye exhibits no discharge. Left eye exhibits no discharge. No scleral icterus.  Cardiovascular: Normal rate, regular rhythm, normal heart sounds and intact distal pulses.  Exam reveals no gallop and no friction rub.   No murmur heard. Pulmonary/Chest: Effort normal and breath sounds normal. No respiratory distress. He has no wheezes. He has no rales. He exhibits no tenderness.  Musculoskeletal: Normal range of motion.  Neurological: He is alert and oriented to person, place, and time.  Skin: Skin is warm, dry and intact. No rash noted. He is not diaphoretic. No erythema. No pallor.  Psychiatric: He has a normal mood and affect. His speech is normal and  behavior is normal. Judgment and thought content normal. Cognition and memory are normal.  Nursing note and vitals reviewed.   Results for orders placed or performed in visit on 12/25/16  Microscopic Examination  Result Value Ref Range   WBC, UA 0-5 0 - 5 /hpf   Epithelial Cells (non renal) 0-10 0 - 10 /hpf   Mucus, UA Present Not Estab.  CBC with Differential/Platelet  Result Value Ref Range   WBC 8.7 3.4 - 10.8 x10E3/uL   RBC 4.57 4.14 - 5.80 x10E6/uL   Hemoglobin 15.7 13.0 - 17.7 g/dL   Hematocrit 45.1 37.5 - 51.0 %   MCV 99 (H) 79 - 97 fL   MCH 34.4 (H) 26.6 - 33.0 pg   MCHC 34.8 31.5 - 35.7 g/dL   RDW 13.8 12.3 - 15.4 %   Platelets 284 150 - 379 x10E3/uL   Neutrophils 46 Not Estab. %   Lymphs 33 Not Estab. %   Monocytes 11 Not Estab. %   Eos 5 Not Estab. %   Basos 1 Not Estab. %   Neutrophils Absolute 4.1 1.4 - 7.0 x10E3/uL   Lymphocytes Absolute 2.9 0.7 - 3.1 x10E3/uL   Monocytes Absolute 0.9 0.1 - 0.9 x10E3/uL   EOS (ABSOLUTE) 0.4 0.0 - 0.4 x10E3/uL   Basophils Absolute 0.1 0.0 - 0.2 x10E3/uL   Immature Granulocytes 4 Not Estab. %   Immature Grans (Abs) 0.4 (H) 0.0 - 0.1 x10E3/uL  Comprehensive metabolic panel  Result Value Ref Range   Glucose 108 (H) 65 - 99 mg/dL   BUN 11 6 - 24 mg/dL   Creatinine, Ser 0.83 0.76 - 1.27 mg/dL   GFR calc non Af Amer 106 >59 mL/min/1.73   GFR calc Af Amer 123 >59 mL/min/1.73   BUN/Creatinine Ratio 13 9 - 20   Sodium 140 134 - 144 mmol/L   Potassium 4.3 3.5 - 5.2 mmol/L   Chloride 99 96 - 106 mmol/L   CO2 22 18 - 29 mmol/L   Calcium 9.7 8.7 - 10.2 mg/dL   Total Protein 7.0 6.0 - 8.5 g/dL   Albumin 4.3 3.5 - 5.5 g/dL   Globulin, Total 2.7 1.5 - 4.5 g/dL   Albumin/Globulin Ratio 1.6 1.2 - 2.2   Bilirubin Total <0.2 0.0 - 1.2 mg/dL   Alkaline Phosphatase 89 39 - 117 IU/L   AST 35 0 - 40 IU/L   ALT 50 (H) 0 - 44 IU/L  Lipid Panel w/o Chol/HDL Ratio  Result Value Ref Range   Cholesterol, Total 321 (H) 100 - 199 mg/dL    Triglycerides 1,513 (HH) 0 - 149 mg/dL   HDL 37 (L) >39 mg/dL   VLDL Cholesterol Cal Comment 5 - 40  mg/dL   LDL Calculated Comment 0 - 99 mg/dL  TSH  Result Value Ref Range   TSH 3.300 0.450 - 4.500 uIU/mL  UA/M w/rflx Culture, Routine  Result Value Ref Range   Specific Gravity, UA 1.025 1.005 - 1.030   pH, UA 5.5 5.0 - 7.5   Color, UA Yellow Yellow   Appearance Ur Clear Clear   Leukocytes, UA Negative Negative   Protein, UA Trace Negative/Trace   Glucose, UA Negative Negative   Ketones, UA Negative Negative   RBC, UA Negative Negative   Bilirubin, UA Negative Negative   Urobilinogen, Ur 0.2 0.2 - 1.0 mg/dL   Nitrite, UA Negative Negative   Microscopic Examination See below:       Assessment & Plan:   Problem List Items Addressed This Visit      Other   Panic disorder - Primary    Under good control on current regimen. Continue current regimen. Continue to monitor. Call with any concerns.       Hyperlipidemia    Rechecking fasting levels today. Will likely need medication. Await results and treat as needed.           Follow up plan: Return Pending results.

## 2017-02-11 ENCOUNTER — Ambulatory Visit (INDEPENDENT_AMBULATORY_CARE_PROVIDER_SITE_OTHER): Payer: BLUE CROSS/BLUE SHIELD | Admitting: Family Medicine

## 2017-02-11 ENCOUNTER — Encounter: Payer: Self-pay | Admitting: Family Medicine

## 2017-02-11 VITALS — BP 138/89 | HR 89 | Temp 98.7°F | Wt 161.6 lb

## 2017-02-11 DIAGNOSIS — E782 Mixed hyperlipidemia: Secondary | ICD-10-CM

## 2017-02-11 DIAGNOSIS — F410 Panic disorder [episodic paroxysmal anxiety] without agoraphobia: Secondary | ICD-10-CM

## 2017-02-11 MED ORDER — CITALOPRAM HYDROBROMIDE 20 MG PO TABS: 20.0000 mg | ORAL_TABLET | Freq: Every day | ORAL | 1 refills | Status: DC

## 2017-02-11 NOTE — Assessment & Plan Note (Signed)
Rechecking fasting levels today. Will likely need medication. Await results and treat as needed.

## 2017-02-11 NOTE — Assessment & Plan Note (Signed)
Under good control on current regimen. Continue current regimen. Continue to monitor. Call with any concerns.   

## 2017-02-12 LAB — LIPID PANEL W/O CHOL/HDL RATIO
Cholesterol, Total: 246 mg/dL — ABNORMAL HIGH (ref 100–199)
HDL: 63 mg/dL (ref >39)
LDL Calculated: 142 mg/dL — ABNORMAL HIGH (ref 0–99)
Triglycerides: 204 mg/dL — ABNORMAL HIGH (ref 0–149)
VLDL CHOLESTEROL CAL: 41 mg/dL — AB (ref 5–40)

## 2017-02-15 ENCOUNTER — Telehealth: Payer: Self-pay | Admitting: Family Medicine

## 2017-02-15 NOTE — Telephone Encounter (Signed)
Called [name] about his labs. They are better, but his cholesterol is still pretty high. I'd like to start him on some medicine to keep it under better control. If he's OK with this, I'll start him on some atorvastatin and we'll recheck him in a month. LMOM for him to call back. OK to give him this message if he calls back.

## 2017-02-18 MED ORDER — ATORVASTATIN CALCIUM 40 MG PO TABS: 40.0000 mg | ORAL_TABLET | Freq: Every day | ORAL | 3 refills | Status: DC

## 2017-02-18 NOTE — Telephone Encounter (Signed)
Rx sent to his pharmacy

## 2017-02-18 NOTE — Telephone Encounter (Signed)
Informed patient of message. Patient is okay with taking medication. Patient asked if medication could be sent to [facility] on [address]

## 2017-03-01 ENCOUNTER — Encounter: Payer: BLUE CROSS/BLUE SHIELD | Admitting: Family Medicine

## 2017-03-28 ENCOUNTER — Encounter: Payer: Self-pay | Admitting: Family Medicine

## 2017-03-28 ENCOUNTER — Ambulatory Visit (INDEPENDENT_AMBULATORY_CARE_PROVIDER_SITE_OTHER): Payer: BLUE CROSS/BLUE SHIELD | Admitting: Family Medicine

## 2017-03-28 VITALS — BP 134/92 | HR 78 | Temp 99.2°F | Wt 161.2 lb

## 2017-03-28 DIAGNOSIS — E782 Mixed hyperlipidemia: Secondary | ICD-10-CM

## 2017-03-28 DIAGNOSIS — F410 Panic disorder [episodic paroxysmal anxiety] without agoraphobia: Secondary | ICD-10-CM

## 2017-03-28 MED ORDER — CLONAZEPAM 0.5 MG PO TABS: 0.5000 mg | ORAL_TABLET | Freq: Two times a day (BID) | ORAL | 1 refills | Status: DC | PRN

## 2017-03-28 NOTE — Assessment & Plan Note (Signed)
Still not at goal. Does not want to increase his celexa. Refill of his klonopin given. Should last until follow up appointment in 3 months.

## 2017-03-28 NOTE — Assessment & Plan Note (Signed)
No side effects from his medicine. Will recheck levels today. Continue to monitor. Call with any concerns.

## 2017-03-28 NOTE — Progress Notes (Signed)
BP (!) 134/92 (BP Location: Left Arm, Patient Position: Sitting, Cuff Size: Normal)   Pulse 78   Temp 99.2 F (37.3 C)   Wt 161 lb 3 oz (73.1 kg)   SpO2 98%   BMI 24.36 kg/m    Subjective:    Patient ID: GENERAL WEARING, male    DOB: 22-Apr-1971, 46 y.o.   MRN: [id number]  HPI: MALCOME AMBROCIO is a 46 y.o. male  Chief Complaint  Patient presents with  . Hyperlipidemia  . Anxiety   HYPERLIPIDEMIA Hyperlipidemia status: stable- just started his medicine about a month ago Satisfied with current treatment?  yes Side effects:  no Medication compliance: excellent compliance Past cholesterol meds: atorvastatin Supplements: none Aspirin:  no The 10-year ASCVD risk score [name], et al., 2013) is: 6.3%   Values used to calculate the score:     Age: 65 years     Sex: Male     Is Non-Hispanic African American: No     Diabetic: No     Tobacco smoker: Yes     Systolic Blood Pressure: 440 mmHg     Is BP treated: No     HDL Cholesterol: 63 mg/dL     Total Cholesterol: 246 mg/dL Chest pain:  no Coronary artery disease:  no Family history CAD:  yes  ANXIETY/STRESS Duration:uncontrolled Anxious mood: yes  Excessive worrying: yes Irritability: no  Sweating: no Nausea: no Palpitations:no Hyperventilation: no Panic attacks: yes Agoraphobia: no  Obscessions/compulsions: yes Depressed mood: yes Depression screen [facility] 2/9 03/28/2017 02/11/2017 12/25/2016 08/29/2016 05/18/2016  Decreased Interest 1 1 0 2 1  Down, Depressed, Hopeless 1 1 0 1 1  PHQ - 2 Score 2 2 0 3 2  Altered sleeping 2 1 - 2 -  Tired, decreased energy 1 1 - 1 -  Change in appetite 0 0 - 0 -  Feeling bad or failure about yourself  0 0 - 1 -  Trouble concentrating 0 0 - 0 -  Moving slowly or fidgety/restless 0 0 - 0 -  Suicidal thoughts 0 0 - 0 -  PHQ-9 Score 5 4 - 7 -  Difficult doing work/chores Somewhat difficult - - - -   Anhedonia: no Weight changes: no Insomnia: no   Hypersomnia:  no Fatigue/loss of energy: yes Feelings of worthlessness: no Feelings of guilt: no Impaired concentration/indecisiveness: no Suicidal ideations: no  Crying spells: no Recent Stressors/Life Changes: no   Relevant past medical, surgical, family and social history reviewed and updated as indicated. Interim medical history since our last visit reviewed. Allergies and medications reviewed and updated.  Review of Systems  Constitutional: Negative.   Respiratory: Negative.   Cardiovascular: Negative.   Musculoskeletal: Negative.   Psychiatric/Behavioral: Negative for agitation, behavioral problems, confusion, decreased concentration, dysphoric mood, hallucinations, self-injury, sleep disturbance and suicidal ideas. The patient is nervous/anxious. The patient is not hyperactive.     Per HPI unless specifically indicated above     Objective:    BP (!) 134/92 (BP Location: Left Arm, Patient Position: Sitting, Cuff Size: Normal)   Pulse 78   Temp 99.2 F (37.3 C)   Wt 161 lb 3 oz (73.1 kg)   SpO2 98%   BMI 24.36 kg/m   Wt Readings from Last 3 Encounters:  03/28/17 161 lb 3 oz (73.1 kg)  02/11/17 161 lb 9.6 oz (73.3 kg)  12/25/16 162 lb (73.5 kg)    Physical Exam  Constitutional: He is oriented to person, place,  and time. He appears well-developed and well-nourished. No distress.  HENT:  Head: Normocephalic and atraumatic.  Right Ear: Hearing normal.  Left Ear: Hearing normal.  Nose: Nose normal.  Eyes: Conjunctivae and lids are normal. Right eye exhibits no discharge. Left eye exhibits no discharge. No scleral icterus.  Cardiovascular: Normal rate, regular rhythm, normal heart sounds and intact distal pulses.  Exam reveals no gallop and no friction rub.   No murmur heard. Pulmonary/Chest: Effort normal and breath sounds normal. No respiratory distress. He has no wheezes. He has no rales. He exhibits no tenderness.  Musculoskeletal: Normal range of motion.  Neurological: He is  alert and oriented to person, place, and time.  Skin: Skin is warm, dry and intact. No rash noted. He is not diaphoretic. No erythema. No pallor.  Psychiatric: He has a normal mood and affect. His speech is normal and behavior is normal. Judgment and thought content normal. Cognition and memory are normal.  Nursing note and vitals reviewed.   Results for orders placed or performed in visit on 02/11/17  Lipid Panel w/o Chol/HDL Ratio  Result Value Ref Range   Cholesterol, Total 246 (H) 100 - 199 mg/dL   Triglycerides 204 (H) 0 - 149 mg/dL   HDL 63 >39 mg/dL   VLDL Cholesterol Cal 41 (H) 5 - 40 mg/dL   LDL Calculated 142 (H) 0 - 99 mg/dL      Assessment & Plan:   Problem List Items Addressed This Visit      Other   Panic disorder    Still not at goal. Does not want to increase his celexa. Refill of his klonopin given. Should last until follow up appointment in 3 months.       Hyperlipidemia - Primary    No side effects from his medicine. Will recheck levels today. Continue to monitor. Call with any concerns.       Relevant Orders   Lipid Panel w/o Chol/HDL Ratio   Comprehensive metabolic panel       Follow up plan: Return in about 3 months (around 06/28/2017) for follow up anxiety.

## 2017-03-29 ENCOUNTER — Encounter: Payer: Self-pay | Admitting: Family Medicine

## 2017-03-29 LAB — LIPID PANEL W/O CHOL/HDL RATIO
Cholesterol, Total: 178 mg/dL (ref 100–199)
HDL: 54 mg/dL (ref >39)
Triglycerides: 415 mg/dL — ABNORMAL HIGH (ref 0–149)

## 2017-03-29 LAB — COMPREHENSIVE METABOLIC PANEL
ALT: 78 IU/L — AB (ref 0–44)
AST: 35 IU/L (ref 0–40)
Albumin/Globulin Ratio: 1.9 (ref 1.2–2.2)
Albumin: 4.3 g/dL (ref 3.5–5.5)
Alkaline Phosphatase: 85 IU/L (ref 39–117)
BUN/Creatinine Ratio: 12 (ref 9–20)
BUN: 11 mg/dL (ref 6–24)
Bilirubin Total: 0.5 mg/dL (ref 0.0–1.2)
CALCIUM: 9.4 mg/dL (ref 8.7–10.2)
CHLORIDE: 101 mmol/L (ref 96–106)
CO2: 25 mmol/L (ref 20–29)
Creatinine, Ser: 0.95 mg/dL (ref 0.76–1.27)
GFR, EST AFRICAN AMERICAN: 111 mL/min/{1.73_m2} (ref >59)
GFR, EST NON AFRICAN AMERICAN: 96 mL/min/{1.73_m2} (ref >59)
GLUCOSE: 106 mg/dL — AB (ref 65–99)
Globulin, Total: 2.3 g/dL (ref 1.5–4.5)
Potassium: 4.2 mmol/L (ref 3.5–5.2)
Sodium: 141 mmol/L (ref 134–144)
TOTAL PROTEIN: 6.6 g/dL (ref 6.0–8.5)

## 2017-05-23 ENCOUNTER — Encounter: Payer: Self-pay | Admitting: Family Medicine

## 2017-05-23 ENCOUNTER — Ambulatory Visit (INDEPENDENT_AMBULATORY_CARE_PROVIDER_SITE_OTHER): Payer: BLUE CROSS/BLUE SHIELD | Admitting: Family Medicine

## 2017-05-23 VITALS — BP 138/92 | HR 87 | Temp 98.1°F | Wt 162.2 lb

## 2017-05-23 DIAGNOSIS — J209 Acute bronchitis, unspecified: Secondary | ICD-10-CM

## 2017-05-23 DIAGNOSIS — D485 Neoplasm of uncertain behavior of skin: Secondary | ICD-10-CM

## 2017-05-23 MED ORDER — AZITHROMYCIN 250 MG PO TABS: ORAL_TABLET | ORAL | 0 refills | Status: DC

## 2017-05-23 MED ORDER — ALBUTEROL SULFATE (2.5 MG/3ML) 0.083% IN NEBU
2.5000 mg | INHALATION_SOLUTION | Freq: Once | RESPIRATORY_TRACT | Status: AC
  Administered 2017-05-23: 2.5 mg via RESPIRATORY_TRACT

## 2017-05-23 MED ORDER — HYDROCOD POLST-CPM POLST ER 10-8 MG/5ML PO SUER: 5.0000 mL | Freq: Every evening | ORAL | 0 refills | Status: DC | PRN

## 2017-05-23 MED ORDER — BENZONATATE 200 MG PO CAPS: 200.0000 mg | ORAL_CAPSULE | Freq: Three times a day (TID) | ORAL | 0 refills | Status: DC | PRN

## 2017-05-23 MED ORDER — PREDNISONE 10 MG PO TABS: ORAL_TABLET | ORAL | 0 refills | Status: DC

## 2017-05-23 NOTE — Progress Notes (Signed)
BP (!) 138/92 (BP Location: Left Arm, Patient Position: Sitting, Cuff Size: Normal)   Pulse 87   Temp 98.1 F (36.7 C)   Wt 162 lb 3 oz (73.6 kg)   SpO2 98%   BMI 24.52 kg/m    Subjective:    Patient ID: Timothy Finley, male    DOB: 10/19/1970, 46 y.o.   MRN: [id number]  HPI: Timothy Finley is a 46 y.o. male  Chief Complaint  Patient presents with  . URI    X 4 days   UPPER RESPIRATORY TRACT INFECTION Duration: 4 days Worst symptom: chest Fever: no Cough: yes Shortness of breath: yes Wheezing: yes Chest pain: yes, with cough Chest tightness: yes Chest congestion: yes Nasal congestion: yes Runny nose: yes Post nasal drip: yes Sneezing: yes Sore throat: no Swollen glands: no Sinus pressure: no Headache: no Face pain: no Toothache: no Ear pain: no  Ear pressure: yes bilateral Eyes red/itching:yes Eye drainage/crusting: yes  Vomiting: no Rash: yes Fatigue: yes Sick contacts: no Strep contacts: no  Context: better Recurrent sinusitis: no Relief with OTC cold/cough medications: no  Treatments attempted: cold/sinus and mucinex   SKIN LESION Duration: 2 months Location: chest Painful: no Itching: yes Onset: sudden Context: has gotten scabby Associated signs and symptoms: none History of skin cancer: no History of precancerous skin lesions: no  Relevant past medical, surgical, family and social history reviewed and updated as indicated. Interim medical history since our last visit reviewed. Allergies and medications reviewed and updated.  Review of Systems  Constitutional: Positive for fatigue. Negative for activity change, appetite change, chills, diaphoresis, fever and unexpected weight change.  HENT: Positive for congestion, postnasal drip, rhinorrhea, sinus pain and sneezing. Negative for dental problem, drooling, ear discharge, ear pain, facial swelling, hearing loss, mouth sores, nosebleeds, sinus pressure, sore throat, tinnitus, trouble  swallowing and voice change.   Respiratory: Positive for cough, chest tightness and shortness of breath. Negative for apnea, choking, wheezing and stridor.   Cardiovascular: Negative.   Skin: Positive for wound. Negative for color change, pallor and rash.  Psychiatric/Behavioral: Negative.     Per HPI unless specifically indicated above     Objective:    BP (!) 138/92 (BP Location: Left Arm, Patient Position: Sitting, Cuff Size: Normal)   Pulse 87   Temp 98.1 F (36.7 C)   Wt 162 lb 3 oz (73.6 kg)   SpO2 98%   BMI 24.52 kg/m   Wt Readings from Last 3 Encounters:  05/23/17 162 lb 3 oz (73.6 kg)  03/28/17 161 lb 3 oz (73.1 kg)  02/11/17 161 lb 9.6 oz (73.3 kg)    Physical Exam  Constitutional: He is oriented to person, place, and time. He appears well-developed and well-nourished. No distress.  HENT:  Head: Normocephalic and atraumatic.  Right Ear: Hearing and external ear normal.  Left Ear: Hearing and external ear normal.  Nose: Nose normal.  Mouth/Throat: Oropharynx is clear and moist. No oropharyngeal exudate.  Eyes: Pupils are equal, round, and reactive to light. Conjunctivae, EOM and lids are normal. Right eye exhibits no discharge. Left eye exhibits no discharge. No scleral icterus.  Neck: Normal range of motion. Neck supple. No JVD present. No tracheal deviation present. No thyromegaly present.  Cardiovascular: Normal rate, regular rhythm, normal heart sounds and intact distal pulses.  Exam reveals no gallop and no friction rub.   No murmur heard. Pulmonary/Chest: Effort normal. No stridor. No respiratory distress. He has wheezes. He has no  rales. He exhibits no tenderness.  Musculoskeletal: Normal range of motion.  Lymphadenopathy:    He has cervical adenopathy.  Neurological: He is alert and oriented to person, place, and time.  Skin: Skin is warm, dry and intact. No rash noted. He is not diaphoretic. No erythema. No pallor.  Scaly 1.5cm flat hyperpigmented lesion  on anterior center chest  Psychiatric: He has a normal mood and affect. His speech is normal and behavior is normal. Judgment and thought content normal. Cognition and memory are normal.  Nursing note and vitals reviewed.   Results for orders placed or performed in visit on 03/28/17  Lipid Panel w/o Chol/HDL Ratio  Result Value Ref Range   Cholesterol, Total 178 100 - 199 mg/dL   Triglycerides 415 (H) 0 - 149 mg/dL   HDL 54 >39 mg/dL   VLDL Cholesterol Cal Comment 5 - 40 mg/dL   LDL Calculated Comment 0 - 99 mg/dL  Comprehensive metabolic panel  Result Value Ref Range   Glucose 106 (H) 65 - 99 mg/dL   BUN 11 6 - 24 mg/dL   Creatinine, Ser 0.95 0.76 - 1.27 mg/dL   GFR calc non Af Amer 96 >59 mL/min/1.73   GFR calc Af Amer 111 >59 mL/min/1.73   BUN/Creatinine Ratio 12 9 - 20   Sodium 141 134 - 144 mmol/L   Potassium 4.2 3.5 - 5.2 mmol/L   Chloride 101 96 - 106 mmol/L   CO2 25 20 - 29 mmol/L   Calcium 9.4 8.7 - 10.2 mg/dL   Total Protein 6.6 6.0 - 8.5 g/dL   Albumin 4.3 3.5 - 5.5 g/dL   Globulin, Total 2.3 1.5 - 4.5 g/dL   Albumin/Globulin Ratio 1.9 1.2 - 2.2   Bilirubin Total 0.5 0.0 - 1.2 mg/dL   Alkaline Phosphatase 85 39 - 117 IU/L   AST 35 0 - 40 IU/L   ALT 78 (H) 0 - 44 IU/L      Assessment & Plan:   Problem List Items Addressed This Visit    None    Visit Diagnoses    Acute bronchitis, unspecified organism    -  Primary   Better following neb- will treat with azithromycin, prednisone, tussionex and tessalon perles. Recheck 1 month.    Relevant Medications   albuterol (PROVENTIL) (2.5 MG/3ML) 0.083% nebulizer solution 2.5 mg (Start on 05/23/2017  1:45 PM)   Neoplasm of uncertain behavior of skin       Will remove with shave biopsy next visit.        Follow up plan: Return 2 weeks, for Procedure visit- lung recheck and shave biopsy.

## 2017-06-06 ENCOUNTER — Encounter: Payer: Self-pay | Admitting: Family Medicine

## 2017-06-06 ENCOUNTER — Ambulatory Visit: Payer: BLUE CROSS/BLUE SHIELD | Admitting: Family Medicine

## 2017-06-06 VITALS — BP 135/83 | HR 102 | Temp 98.2°F | Wt 163.2 lb

## 2017-06-06 DIAGNOSIS — J209 Acute bronchitis, unspecified: Secondary | ICD-10-CM | POA: Diagnosis not present

## 2017-06-06 DIAGNOSIS — D485 Neoplasm of uncertain behavior of skin: Secondary | ICD-10-CM

## 2017-06-06 DIAGNOSIS — F410 Panic disorder [episodic paroxysmal anxiety] without agoraphobia: Secondary | ICD-10-CM | POA: Diagnosis not present

## 2017-06-06 MED ORDER — ATORVASTATIN CALCIUM 40 MG PO TABS: 40.0000 mg | ORAL_TABLET | Freq: Every day | ORAL | 1 refills | Status: DC

## 2017-06-06 MED ORDER — CLONAZEPAM 0.5 MG PO TABS: 0.5000 mg | ORAL_TABLET | Freq: Two times a day (BID) | ORAL | 1 refills | Status: DC | PRN

## 2017-06-06 NOTE — Progress Notes (Signed)
BP 135/83 (BP Location: Left Arm, Cuff Size: Normal)   Pulse (!) 102   Temp 98.2 F (36.8 C)   Wt 163 lb 3 oz (74 kg)   BMI 24.67 kg/m    Subjective:    Patient ID: Timothy Finley, male    DOB: 1971/01/19, 46 y.o.   MRN: [id number]  HPI: Timothy Finley is a 46 y.o. male  Chief Complaint  Patient presents with  . Nevus  . lung recheck   Breathing doing much better. Some coughing- but a lot better. No wheezing. No fevers. Feeling well.   SKIN LESION Duration: 2 months Location: chest Painful: no Itching: yes Onset: sudden Context: Has gotten scabby Associated signs and symptoms: none History of skin cancer: no History of precancerous skin lesions: no  ANXIETY/STRESS Duration:controlled Anxious mood: yes  Excessive worrying: yes Irritability: no  Sweating: no Nausea: no Palpitations:no Hyperventilation: no Panic attacks: no Agoraphobia: no  Obscessions/compulsions: no Depressed mood: no Depression screen [facility] 2/9 03/28/2017 02/11/2017 12/25/2016 08/29/2016 05/18/2016  Decreased Interest 1 1 0 2 1  Down, Depressed, Hopeless 1 1 0 1 1  PHQ - 2 Score 2 2 0 3 2  Altered sleeping 2 1 - 2 -  Tired, decreased energy 1 1 - 1 -  Change in appetite 0 0 - 0 -  Feeling bad or failure about yourself  0 0 - 1 -  Trouble concentrating 0 0 - 0 -  Moving slowly or fidgety/restless 0 0 - 0 -  Suicidal thoughts 0 0 - 0 -  PHQ-9 Score 5 4 - 7 -  Difficult doing work/chores Somewhat difficult - - - -   GAD 7 : Generalized Anxiety Score 03/28/2017 02/11/2017 05/18/2016 04/20/2016  Nervous, Anxious, on Edge 1 1 1 3   Control/stop worrying 1 0 1 2  Worry too much - different things 1 1 1 2   Trouble relaxing 0 0 0 1  Restless 0 0 0 0  Easily annoyed or irritable 0 1 1 1   Afraid - awful might happen 0 1 1 1   Total GAD 7 Score 3 4 5 10   Anxiety Difficulty Somewhat difficult - Somewhat difficult Somewhat difficult   Anhedonia: no Weight changes: no Insomnia: yes hard to fall  asleep  Hypersomnia: no Fatigue/loss of energy: yes Feelings of worthlessness: no Feelings of guilt: no Impaired concentration/indecisiveness: no Suicidal ideations: no  Crying spells: no Recent Stressors/Life Changes: no  Relevant past medical, surgical, family and social history reviewed and updated as indicated. Interim medical history since our last visit reviewed. Allergies and medications reviewed and updated.  Review of Systems  Constitutional: Negative.   Respiratory: Negative.   Cardiovascular: Negative.   Skin: Positive for color change. Negative for pallor, rash and wound.  Psychiatric/Behavioral: Negative.     Per HPI unless specifically indicated above     Objective:    BP 135/83 (BP Location: Left Arm, Cuff Size: Normal)   Pulse (!) 102   Temp 98.2 F (36.8 C)   Wt 163 lb 3 oz (74 kg)   BMI 24.67 kg/m   Wt Readings from Last 3 Encounters:  06/06/17 163 lb 3 oz (74 kg)  05/23/17 162 lb 3 oz (73.6 kg)  03/28/17 161 lb 3 oz (73.1 kg)    Physical Exam  Constitutional: He is oriented to person, place, and time. He appears well-developed and well-nourished. No distress.  HENT:  Head: Normocephalic and atraumatic.  Right Ear: Hearing normal.  Left  Ear: Hearing normal.  Nose: Nose normal.  Eyes: Conjunctivae and lids are normal. Right eye exhibits no discharge. Left eye exhibits no discharge. No scleral icterus.  Cardiovascular: Normal rate, regular rhythm, normal heart sounds and intact distal pulses. Exam reveals no gallop and no friction rub.  No murmur heard. Pulmonary/Chest: Effort normal and breath sounds normal. No respiratory distress. He has no wheezes. He has no rales. He exhibits no tenderness.  Musculoskeletal: Normal range of motion.  Neurological: He is alert and oriented to person, place, and time.  Skin: Skin is warm, dry and intact. No rash noted. He is not diaphoretic. No erythema. No pallor.  Scaly 1.5cm flat hyperpigmented lesion on  anterior center chest   Psychiatric: He has a normal mood and affect. His speech is normal and behavior is normal. Judgment and thought content normal. Cognition and memory are normal.    Results for orders placed or performed in visit on 03/28/17  Lipid Panel w/o Chol/HDL Ratio  Result Value Ref Range   Cholesterol, Total 178 100 - 199 mg/dL   Triglycerides 415 (H) 0 - 149 mg/dL   HDL 54 >39 mg/dL   VLDL Cholesterol Cal Comment 5 - 40 mg/dL   LDL Calculated Comment 0 - 99 mg/dL  Comprehensive metabolic panel  Result Value Ref Range   Glucose 106 (H) 65 - 99 mg/dL   BUN 11 6 - 24 mg/dL   Creatinine, Ser 0.95 0.76 - 1.27 mg/dL   GFR calc non Af Amer 96 >59 mL/min/1.73   GFR calc Af Amer 111 >59 mL/min/1.73   BUN/Creatinine Ratio 12 9 - 20   Sodium 141 134 - 144 mmol/L   Potassium 4.2 3.5 - 5.2 mmol/L   Chloride 101 96 - 106 mmol/L   CO2 25 20 - 29 mmol/L   Calcium 9.4 8.7 - 10.2 mg/dL   Total Protein 6.6 6.0 - 8.5 g/dL   Albumin 4.3 3.5 - 5.5 g/dL   Globulin, Total 2.3 1.5 - 4.5 g/dL   Albumin/Globulin Ratio 1.9 1.2 - 2.2   Bilirubin Total 0.5 0.0 - 1.2 mg/dL   Alkaline Phosphatase 85 39 - 117 IU/L   AST 35 0 - 40 IU/L   ALT 78 (H) 0 - 44 IU/L      Assessment & Plan:   Problem List Items Addressed This Visit      Other   Panic disorder    Improved slightly. Stable on his current regimen. Refill of his klonopin given. Should last until follow up appointment in 3 months.        Other Visit Diagnoses    Acute bronchitis, unspecified organism    -  Primary   Resolved. Lungs clear. Call with any concerns.    Neoplasm of uncertain behavior of skin       Removed today as below. Await pathology.   Relevant Orders   Pathology Report     Skin Procedure  Procedure: Informed consent given.  Sterile prep of the area.  Area infiltrated with lidocaine with epinephrine.  Using a surgical blade, part of the upper dermis shaved off and sent  for pathology.  Area cauterized. Pt ed  on scarring.     Diagnosis:   ICD-10-CM   1. Acute bronchitis, unspecified organism J20.9    Resolved. Lungs clear. Call with any concerns.   2. Neoplasm of uncertain behavior of skin D48.5 Pathology Report   Removed today as below. Await pathology.  3. Panic disorder F41.0  Lesion Location/Size: Scaly 1.5cm flat hyperpigmented lesion on anterior center chest  Physician: MJ Consent:  Risks, benefits, and alternative treatments discussed and all questions were answered.  Patient elected to proceed and verbal consent obtained.  Description: Area prepped and draped using semi-sterile technique. Area locally anesthetized using 3 cc's of lidocaine 2% with epi. Shave biopsy of lesion performed using a dermablade.  Adequate hemostastis achieved using Silver Nitrate. Wound dressed after application of bacitracin ointment. Complications: None Estimated Blood Loss: Minimal  Pathology:  Sent Post Procedure Instructions: Wound care instructions discussed and patient was instructed to keep area clean and dry.  Signs and symptoms of infection discussed, patient agrees to contact the office ASAP should they occur.  Dressing change recommended every other day.  Follow up plan: Return in about 3 months (around 09/06/2017) for Follow up anxiety.

## 2017-06-06 NOTE — Assessment & Plan Note (Signed)
Improved slightly. Stable on his current regimen. Refill of his klonopin given. Should last until follow up appointment in 3 months.

## 2017-06-11 ENCOUNTER — Telehealth: Payer: Self-pay | Admitting: Family Medicine

## 2017-06-11 LAB — PATHOLOGY

## 2017-06-11 NOTE — Telephone Encounter (Signed)
Patient notified

## 2017-06-11 NOTE — Telephone Encounter (Signed)
Please let him know that the spot on his chest was just irritated skin. Nothing to worry about.

## 2017-06-25 ENCOUNTER — Ambulatory Visit: Payer: Self-pay

## 2017-06-25 NOTE — Telephone Encounter (Signed)
Phone call rec'd from pt.  Reported he wants to schedule an appt. To get a breathing treatment tomorrow.  Reported he has had shortness of breath and cough that seems worse the past 2 days. Describes shortness of breath as "moderate." Reported that he started having chest tightness today.  Reported he is coughing up mucus at times.  Denied fever/ chills.  Denied nasal stuffiness.  Stated he had bronchitis about a month ago, and was better, until he started working on his daughter's house last week.  Now, reported that the shortness of breath is present at rest, and worse with activity and at night.  The protocol advised the pt. To go to the ER, but he refused.  He is requesting an appt. with his PCP tomorrow.  Appt. given with NP at 1:30 PM. Will make office aware of pt's complaints, and request he be worked in sooner, if there is a cancellation.  Pt. agreed to go to the ER, if his symptoms worsen.         Reason for Disposition . [1] MODERATE difficulty breathing (e.g., speaks in phrases, SOB even at rest, pulse 100-120) AND [2] NEW-onset or WORSE than normal  Answer Assessment - Initial Assessment Questions 1. RESPIRATORY STATUS: "Describe your breathing?" (e.g., wheezing, shortness of breath, unable to speak, severe coughing)      C/o short of breath with rest and worse with activity; coughing; productive at times  2. ONSET: "When did this breathing problem begin?"      Had bronchitis one month ago; worse since increasing physical activity last week 3. PATTERN "Does the difficult breathing come and go, or has it been constant since it started?"      Short of breath freq.  and worse at night  4. SEVERITY: "How bad is your breathing?" (e.g., mild, moderate, severe)    - MILD: No SOB at rest, mild SOB with walking, speaks normally in sentences, can lay down, no retractions, pulse < 100.    - MODERATE: SOB at rest, SOB with minimal exertion and prefers to sit, cannot lie down flat, speaks in phrases,  mild retractions, audible wheezing, pulse 100-120.    - SEVERE: Very SOB at rest, speaks in single words, struggling to breathe, sitting hunched forward, retractions, pulse > 120      moderate 5. RECURRENT SYMPTOM: "Have you had difficulty breathing before?" If so, ask: "When was the last time?" and "What happened that time?"      Yes; hx. of allergic bronchitis 6. CARDIAC HISTORY: "Do you have any history of heart disease?" (e.g., heart attack, angina, bypass surgery, angioplasty)      no 7. LUNG HISTORY: "Do you have any history of lung disease?"  (e.g., pulmonary embolus, asthma, emphysema)     "Asthma with allergic bronchitis" 8. CAUSE: "What do you think is causing the breathing problem?"      Recent hx of bronchitis 9. OTHER SYMPTOMS: "Do you have any other symptoms? (e.g., dizziness, runny nose, cough, chest pain, fever)     Cough with chest tightness.  Hurts with deep breath; no fever; no runny nose 10. PREGNANCY: "Is there any chance you are pregnant?" "When was your last menstrual period?"      N/a 11. TRAVEL: "Have you traveled out of the country in the last month?" (e.g., travel history, exposures)      no  Protocols used: BREATHING DIFFICULTY-A-AH

## 2017-06-26 ENCOUNTER — Encounter: Payer: Self-pay | Admitting: Family Medicine

## 2017-06-26 ENCOUNTER — Ambulatory Visit: Payer: BLUE CROSS/BLUE SHIELD | Admitting: Family Medicine

## 2017-06-26 VITALS — BP 134/88 | HR 98 | Temp 98.3°F | Wt 168.0 lb

## 2017-06-26 DIAGNOSIS — J069 Acute upper respiratory infection, unspecified: Secondary | ICD-10-CM

## 2017-06-26 MED ORDER — PROAIR HFA 108 (90 BASE) MCG/ACT IN AERS: INHALATION_SPRAY | RESPIRATORY_TRACT | 6 refills | Status: DC

## 2017-06-26 MED ORDER — PREDNISONE 10 MG PO TABS: ORAL_TABLET | ORAL | 0 refills | Status: DC

## 2017-06-26 MED ORDER — BENZONATATE 200 MG PO CAPS: 200.0000 mg | ORAL_CAPSULE | Freq: Two times a day (BID) | ORAL | 0 refills | Status: DC | PRN

## 2017-06-26 MED ORDER — DOXYCYCLINE HYCLATE 100 MG PO TABS: 100.0000 mg | ORAL_TABLET | Freq: Two times a day (BID) | ORAL | 0 refills | Status: DC

## 2017-06-26 NOTE — Progress Notes (Signed)
   BP 134/88   Pulse 98   Temp 98.3 F (36.8 C) (Oral)   Wt 168 lb (76.2 kg)   SpO2 98%   BMI 25.39 kg/m    Subjective:    Patient ID: Timothy Finley, male    DOB: 1970/10/14, 46 y.o.   MRN: [id number]  HPI: Timothy Finley is a 46 y.o. male  Chief Complaint  Patient presents with  . Cough  . Fatigue   Chest tightness, hacking cough, SOB, decreased exercise tolerance, fatigue. Treated a month ago for bronchitis with good improvement initially, but sxs returned about a week ago. Has been back on mucinex and cough syrups since sxs returned. Still using albuterol inhaler, minimal results. No sick contacts.   Relevant past medical, surgical, family and social history reviewed and updated as indicated. Interim medical history since our last visit reviewed. Allergies and medications reviewed and updated.  Review of Systems  Constitutional: Positive for fatigue.  HENT: Negative.   Eyes: Negative.   Respiratory: Positive for cough, chest tightness and shortness of breath.   Cardiovascular: Negative.   Gastrointestinal: Negative.   Musculoskeletal: Negative.   Neurological: Negative.   Psychiatric/Behavioral: Negative.    Per HPI unless specifically indicated above     Objective:    BP 134/88   Pulse 98   Temp 98.3 F (36.8 C) (Oral)   Wt 168 lb (76.2 kg)   SpO2 98%   BMI 25.39 kg/m   Wt Readings from Last 3 Encounters:  06/26/17 168 lb (76.2 kg)  06/06/17 163 lb 3 oz (74 kg)  05/23/17 162 lb 3 oz (73.6 kg)    Physical Exam  Constitutional: He is oriented to person, place, and time. He appears well-developed and well-nourished. No distress.  HENT:  Head: Atraumatic.  Right Ear: External ear normal.  Left Ear: External ear normal.  Nose: Nose normal.  Oropharynx injected  Eyes: Conjunctivae are normal. Pupils are equal, round, and reactive to light.  Neck: Normal range of motion. Neck supple.  Cardiovascular: Normal rate, regular rhythm and normal heart sounds.   Pulmonary/Chest: Effort normal. No respiratory distress. He has wheezes.  Musculoskeletal: Normal range of motion.  Neurological: He is alert and oriented to person, place, and time.  Skin: Skin is warm and dry.  Psychiatric: He has a normal mood and affect. His behavior is normal.  Nursing note and vitals reviewed.      Assessment & Plan:   Problem List Items Addressed This Visit    None    Visit Diagnoses    Upper respiratory tract infection, unspecified type    -  Primary   Will treat with doxy, prednisone, and tessalon perles. Supportive care reviewed. Pt to f/u if no improvement.        Follow up plan: Return if symptoms worsen or fail to improve.

## 2017-06-27 NOTE — Patient Instructions (Signed)
Follow up as needed

## 2017-07-04 ENCOUNTER — Encounter: Payer: Self-pay | Admitting: Family Medicine

## 2017-07-04 ENCOUNTER — Ambulatory Visit: Payer: BLUE CROSS/BLUE SHIELD | Admitting: Family Medicine

## 2017-07-04 VITALS — BP 143/86 | HR 85 | Temp 97.8°F | Wt 171.4 lb

## 2017-07-04 DIAGNOSIS — J45901 Unspecified asthma with (acute) exacerbation: Secondary | ICD-10-CM | POA: Diagnosis not present

## 2017-07-04 DIAGNOSIS — J302 Other seasonal allergic rhinitis: Secondary | ICD-10-CM

## 2017-07-04 DIAGNOSIS — R062 Wheezing: Secondary | ICD-10-CM

## 2017-07-04 MED ORDER — MONTELUKAST SODIUM 10 MG PO TABS: 10.0000 mg | ORAL_TABLET | Freq: Every day | ORAL | 3 refills | Status: DC

## 2017-07-04 MED ORDER — ALBUTEROL SULFATE (2.5 MG/3ML) 0.083% IN NEBU
2.5000 mg | INHALATION_SOLUTION | Freq: Once | RESPIRATORY_TRACT | Status: AC
  Administered 2017-07-04: 2.5 mg via RESPIRATORY_TRACT

## 2017-07-04 MED ORDER — BUDESONIDE-FORMOTEROL FUMARATE 160-4.5 MCG/ACT IN AERO: 2.0000 | INHALATION_SPRAY | Freq: Two times a day (BID) | RESPIRATORY_TRACT | 1 refills | Status: DC

## 2017-07-04 NOTE — Assessment & Plan Note (Signed)
Will increase allergy regimen to include singulair and flonase, will also switch to xyzal or allegra as he's been on zyrtec for a long time. Humidifier, continue mucinex.

## 2017-07-04 NOTE — Progress Notes (Signed)
   BP (!) 143/86   Pulse 85   Temp 97.8 F (36.6 C) (Oral)   Wt 171 lb 6.4 oz (77.7 kg)   SpO2 98%   BMI 25.91 kg/m    Subjective:    Patient ID: Timothy Finley, male    DOB: 02-27-1971, 46 y.o.   MRN: [id number]  HPI: Timothy Finley is a 46 y.o. male  Chief Complaint  Patient presents with  . URI    pt states he is still having a lot of chest congestion and cough, states he finished the prednisone but is still taking the tessalon and doxycycline   Patient presents for bronchitis f/u. Still having significant cough to where his chest is sore as if someone's been punching him. Feels like he's not getting as much mucus up as he was previously. Persistent generalized malaise, wheezing. Still taking mucinex, tessalon, tussionex, doxycycline. Completed second prednisone taper. Denies fevers, chills, sore throat, ear pain.   Relevant past medical, surgical, family and social history reviewed and updated as indicated. Interim medical history since our last visit reviewed. Allergies and medications reviewed and updated.  Review of Systems  Constitutional: Positive for fatigue.  HENT: Negative.   Respiratory: Positive for cough, chest tightness, shortness of breath and wheezing.   Cardiovascular: Negative.   Gastrointestinal: Negative.   Genitourinary: Negative.   Musculoskeletal: Negative.   Neurological: Negative.   Psychiatric/Behavioral: Negative.    Per HPI unless specifically indicated above     Objective:    BP (!) 143/86   Pulse 85   Temp 97.8 F (36.6 C) (Oral)   Wt 171 lb 6.4 oz (77.7 kg)   SpO2 98%   BMI 25.91 kg/m   Wt Readings from Last 3 Encounters:  07/04/17 171 lb 6.4 oz (77.7 kg)  06/26/17 168 lb (76.2 kg)  06/06/17 163 lb 3 oz (74 kg)    Physical Exam  Constitutional: He is oriented to person, place, and time. He appears well-developed and well-nourished. No distress.  HENT:  Head: Atraumatic.  Eyes: Conjunctivae are normal. Pupils are equal,  round, and reactive to light.  Neck: Normal range of motion. Neck supple.  Cardiovascular: Normal rate and normal heart sounds.  Pulmonary/Chest: Effort normal. No respiratory distress. He has wheezes.  Musculoskeletal: Normal range of motion.  Neurological: He is alert and oriented to person, place, and time.  Skin: Skin is warm and dry.  Psychiatric: He has a normal mood and affect. His behavior is normal.  Nursing note and vitals reviewed.     Assessment & Plan:   Problem List Items Addressed This Visit      Respiratory   Allergic rhinitis    Will increase allergy regimen to include singulair and flonase, will also switch to xyzal or allegra as he's been on zyrtec for a long time. Humidifier, continue mucinex.        Other Visit Diagnoses    Bronchitis, allergic, unspecified asthma severity, with acute exacerbation    -  Primary   Will add symbicort BID until flare subsides. Complete doxy, increase allergy regimen. Cont tessalon, albuterol, and tussionex prn. F/u if no improvement   Relevant Medications   budesonide-formoterol (SYMBICORT) 160-4.5 MCG/ACT inhaler   montelukast (SINGULAIR) 10 MG tablet   albuterol (PROVENTIL) (2.5 MG/3ML) 0.083% nebulizer solution 2.5 mg (Completed)      Follow up plan: Return if symptoms worsen or fail to improve.

## 2017-07-04 NOTE — Patient Instructions (Signed)
Follow up if no improvement 

## 2017-08-10 ENCOUNTER — Other Ambulatory Visit: Payer: Self-pay | Admitting: Family Medicine

## 2017-09-29 ENCOUNTER — Other Ambulatory Visit: Payer: Self-pay | Admitting: Family Medicine

## 2017-10-15 ENCOUNTER — Encounter: Payer: Self-pay | Admitting: Family Medicine

## 2017-10-15 ENCOUNTER — Ambulatory Visit: Payer: Self-pay | Admitting: Family Medicine

## 2017-10-15 VITALS — BP 121/78 | HR 79 | Temp 98.7°F | Wt 170.5 lb

## 2017-10-15 DIAGNOSIS — F410 Panic disorder [episodic paroxysmal anxiety] without agoraphobia: Secondary | ICD-10-CM

## 2017-10-15 DIAGNOSIS — E782 Mixed hyperlipidemia: Secondary | ICD-10-CM

## 2017-10-15 MED ORDER — ATORVASTATIN CALCIUM 40 MG PO TABS: 40.0000 mg | ORAL_TABLET | Freq: Every day | ORAL | 1 refills | Status: DC

## 2017-10-15 MED ORDER — CLONAZEPAM 0.5 MG PO TABS: 0.5000 mg | ORAL_TABLET | Freq: Two times a day (BID) | ORAL | 1 refills | Status: DC | PRN

## 2017-10-15 NOTE — Assessment & Plan Note (Signed)
Rechecking levels today. Await results. Refill sent to his pharmacy. Call with any concerns.

## 2017-10-15 NOTE — Progress Notes (Signed)
BP 121/78 (BP Location: Left Arm, Patient Position: Sitting, Cuff Size: Normal)   Pulse 79   Temp 98.7 F (37.1 C) (Oral)   Wt 170 lb 8 oz (77.3 kg)   SpO2 99%   BMI 25.77 kg/m    Subjective:    Patient ID: Timothy Finley, male    DOB: Jul 11, 1971, 47 y.o.   MRN: [id number]  HPI: Timothy Finley is a 47 y.o. male  Chief Complaint  Patient presents with  . Hyperlipidemia  . Anxiety   ANXIETY/STRESS- had been doing well. Had gained a lot of weight over the past couple of months and then lost it again. Stopped taking his celexa quite a bit ago. Taking the klonopin about 1-2x a month Duration:controlled Anxious mood: no  Excessive worrying: no Irritability: no  Sweating: no Nausea: no Palpitations:no Hyperventilation: no Panic attacks: no Agoraphobia: no  Obscessions/compulsions: no Depressed mood: no Depression screen [facility] 2/9 10/15/2017 06/06/2017 03/28/2017 02/11/2017 12/25/2016  Decreased Interest 1 1 1 1  0  Down, Depressed, Hopeless 1 0 1 1 0  PHQ - 2 Score 2 1 2 2  0  Altered sleeping 1 2 2 1  -  Tired, decreased energy 1 2 1 1  -  Change in appetite 0 0 0 0 -  Feeling bad or failure about yourself  1 0 0 0 -  Trouble concentrating 0 0 0 0 -  Moving slowly or fidgety/restless - 0 0 0 -  Suicidal thoughts 0 0 0 0 -  PHQ-9 Score 5 5 5 4  -  Difficult doing work/chores - - Somewhat difficult - -   GAD 7 : Generalized Anxiety Score 10/15/2017 06/06/2017 03/28/2017 02/11/2017  Nervous, Anxious, on Edge 1 1 1 1   Control/stop worrying 0 1 1 0  Worry too much - different things 0 1 1 1   Trouble relaxing 0 1 0 0  Restless 0 0 0 0  Easily annoyed or irritable 1 1 0 1  Afraid - awful might happen 0 0 0 1  Total GAD 7 Score 2 5 3 4   Anxiety Difficulty - - Somewhat difficult -   Anhedonia: no Weight changes: no Insomnia: no   Hypersomnia: no Fatigue/loss of energy: yes Feelings of worthlessness: no Feelings of guilt: no Impaired concentration/indecisiveness: no Suicidal  ideations: no  Crying spells: no Recent Stressors/Life Changes: no   Relationship problems: no   Family stress: yes     Financial stress: no    Job stress: no    Recent death/loss: no  HYPERLIPIDEMIA Hyperlipidemia status: good compliance Satisfied with current treatment?  yes Side effects:  no Medication compliance: excellent compliance Past cholesterol meds: atorvastatin Supplements: none Aspirin:  no The 10-year ASCVD risk score [name], et al., 2013) is: 4%   Values used to calculate the score:     Age: 56 years     Sex: Male     Is Non-Hispanic African American: No     Diabetic: No     Tobacco smoker: Yes     Systolic Blood Pressure: 099 mmHg     Is BP treated: No     HDL Cholesterol: 54 mg/dL     Total Cholesterol: 178 mg/dL Chest pain:  no Coronary artery disease:  no Family history CAD:  yes  Relevant past medical, surgical, family and social history reviewed and updated as indicated. Interim medical history since our last visit reviewed. Allergies and medications reviewed and updated.  Review of Systems  Constitutional:  Negative.   Respiratory: Negative.   Cardiovascular: Negative.   Psychiatric/Behavioral: Negative for agitation, behavioral problems, confusion, decreased concentration, dysphoric mood, hallucinations, self-injury, sleep disturbance and suicidal ideas. The patient is nervous/anxious. The patient is not hyperactive.     Per HPI unless specifically indicated above     Objective:    BP 121/78 (BP Location: Left Arm, Patient Position: Sitting, Cuff Size: Normal)   Pulse 79   Temp 98.7 F (37.1 C) (Oral)   Wt 170 lb 8 oz (77.3 kg)   SpO2 99%   BMI 25.77 kg/m   Wt Readings from Last 3 Encounters:  10/15/17 170 lb 8 oz (77.3 kg)  07/04/17 171 lb 6.4 oz (77.7 kg)  06/26/17 168 lb (76.2 kg)    Physical Exam  Constitutional: He is oriented to person, place, and time. He appears well-developed and well-nourished. No distress.  HENT:    Head: Normocephalic and atraumatic.  Right Ear: Hearing normal.  Left Ear: Hearing normal.  Nose: Nose normal.  Eyes: Conjunctivae and lids are normal. Right eye exhibits no discharge. Left eye exhibits no discharge. No scleral icterus.  Cardiovascular: Normal rate, regular rhythm, normal heart sounds and intact distal pulses. Exam reveals no gallop and no friction rub.  No murmur heard. Pulmonary/Chest: Effort normal and breath sounds normal. No respiratory distress. He has no wheezes. He has no rales. He exhibits no tenderness.  Musculoskeletal: Normal range of motion.  Neurological: He is alert and oriented to person, place, and time.  Skin: Skin is warm, dry and intact. No rash noted. He is not diaphoretic. No erythema. No pallor.  Psychiatric: He has a normal mood and affect. His speech is normal and behavior is normal. Judgment and thought content normal. Cognition and memory are normal.  Nursing note and vitals reviewed.   Results for orders placed or performed in visit on 06/06/17  Pathology Report  Result Value Ref Range   . Comment    . Comment    . Comment    . Comment    . Comment    . Comment    . Comment    . Comment       Assessment & Plan:   Problem List Items Addressed This Visit      Other   Panic disorder    Has stopped celexa. Taking klonopin about 1-2x a month. Will continue klonopin. Recheck 6 months (this Rx should last until then) Call with any concerns.       Hyperlipidemia - Primary    Rechecking levels today. Await results. Refill sent to his pharmacy. Call with any concerns.       Relevant Medications   atorvastatin (LIPITOR) 40 MG tablet   Other Relevant Orders   Comprehensive metabolic panel   Lipid Panel w/o Chol/HDL Ratio       Follow up plan: Return in about 6 months (around 04/17/2018) for Physical.

## 2017-10-15 NOTE — Assessment & Plan Note (Signed)
Has stopped celexa. Taking klonopin about 1-2x a month. Will continue klonopin. Recheck 6 months (this Rx should last until then) Call with any concerns.

## 2017-10-16 ENCOUNTER — Telehealth: Payer: Self-pay | Admitting: Family Medicine

## 2017-10-16 DIAGNOSIS — E782 Mixed hyperlipidemia: Secondary | ICD-10-CM

## 2017-10-16 LAB — COMPREHENSIVE METABOLIC PANEL
ALBUMIN: 4.5 g/dL (ref 3.5–5.5)
ALK PHOS: 89 IU/L (ref 39–117)
ALT: 61 IU/L — ABNORMAL HIGH (ref 0–44)
AST: 27 IU/L (ref 0–40)
Albumin/Globulin Ratio: 1.9 (ref 1.2–2.2)
BILIRUBIN TOTAL: 0.4 mg/dL (ref 0.0–1.2)
BUN/Creatinine Ratio: 17 (ref 9–20)
BUN: 14 mg/dL (ref 6–24)
CHLORIDE: 103 mmol/L (ref 96–106)
CO2: 24 mmol/L (ref 20–29)
Calcium: 9.5 mg/dL (ref 8.7–10.2)
Creatinine, Ser: 0.81 mg/dL (ref 0.76–1.27)
GFR calc Af Amer: 123 mL/min/{1.73_m2} (ref >59)
GFR calc non Af Amer: 107 mL/min/{1.73_m2} (ref >59)
GLUCOSE: 105 mg/dL — AB (ref 65–99)
Globulin, Total: 2.4 g/dL (ref 1.5–4.5)
Potassium: 4.5 mmol/L (ref 3.5–5.2)
Sodium: 142 mmol/L (ref 134–144)
Total Protein: 6.9 g/dL (ref 6.0–8.5)

## 2017-10-16 LAB — LIPID PANEL W/O CHOL/HDL RATIO
CHOLESTEROL TOTAL: 297 mg/dL — AB (ref 100–199)
HDL: 45 mg/dL (ref >39)
Triglycerides: 565 mg/dL (ref 0–149)

## 2017-10-16 NOTE — Telephone Encounter (Signed)
Please let him know that his electrolytes look good, but his cholesterol went up quite a bit! Has he bene taking his medicine? If he hasn't we'll recheck in about a month and he should take it every day. If he has, I'd like him to take 2 of his atorvastatin and I'll send through the 80 mg to his pharmacy and we'll recheck it next time he comes in. Thanks!

## 2017-10-16 NOTE — Telephone Encounter (Signed)
Patient notified, patient states that he has not been taking his medication for the last week or so. He will take his medication everyday and come back in a month to have his cholesterol rechecked.   Please place future order

## 2017-10-16 NOTE — Telephone Encounter (Signed)
Order in.

## 2018-01-23 ENCOUNTER — Telehealth: Payer: Self-pay | Admitting: Family Medicine

## 2018-01-23 NOTE — Telephone Encounter (Signed)
He will need an appointment. That Rx was supposed to last 6 months, and if he was feeling worse, he was supposed to call.

## 2018-01-23 NOTE — Telephone Encounter (Signed)
Copied from [place] [id number]. Topic: Quick Communication - Rx Refill/Question >> Jan 23, 2018  2:48 PM Boyd Kerbs wrote: Medication:  clonazePAM (KLONOPIN) 0.5 MG tablet He will be out before he can get in to see Dr. Wynetta Emery  Has the patient contacted their pharmacy? No. (Agent: If no, request that the patient contact the pharmacy for the refill.) (Agent: If yes, when and what did the pharmacy advise?)  Preferred Pharmacy (with phone number or street name):  [id number] [place] [address] [place] [address] Phone: [phone] Fax: [phone]    Agent: Please be advised that RX refills may take up to 3 business days. We ask that you follow-up with your pharmacy.

## 2018-01-24 NOTE — Telephone Encounter (Signed)
Patient notified

## 2018-01-28 ENCOUNTER — Other Ambulatory Visit: Payer: Self-pay | Admitting: Family Medicine

## 2018-02-07 ENCOUNTER — Ambulatory Visit: Payer: BLUE CROSS/BLUE SHIELD | Admitting: Family Medicine

## 2018-02-07 ENCOUNTER — Encounter: Payer: Self-pay | Admitting: Family Medicine

## 2018-02-07 VITALS — BP 148/93 | HR 102 | Wt 163.0 lb

## 2018-02-07 DIAGNOSIS — E782 Mixed hyperlipidemia: Secondary | ICD-10-CM | POA: Diagnosis not present

## 2018-02-07 DIAGNOSIS — F410 Panic disorder [episodic paroxysmal anxiety] without agoraphobia: Secondary | ICD-10-CM | POA: Diagnosis not present

## 2018-02-07 DIAGNOSIS — R109 Unspecified abdominal pain: Secondary | ICD-10-CM

## 2018-02-07 DIAGNOSIS — J4520 Mild intermittent asthma, uncomplicated: Secondary | ICD-10-CM

## 2018-02-07 MED ORDER — BUPROPION HCL ER (SR) 150 MG PO TB12: ORAL_TABLET | ORAL | 3 refills | Status: DC

## 2018-02-07 MED ORDER — ATORVASTATIN CALCIUM 40 MG PO TABS: 40.0000 mg | ORAL_TABLET | Freq: Every day | ORAL | 1 refills | Status: DC

## 2018-02-07 MED ORDER — PROAIR HFA 108 (90 BASE) MCG/ACT IN AERS: INHALATION_SPRAY | RESPIRATORY_TRACT | 6 refills | Status: DC

## 2018-02-07 MED ORDER — CLONAZEPAM 0.5 MG PO TABS: 0.5000 mg | ORAL_TABLET | Freq: Two times a day (BID) | ORAL | 1 refills | Status: DC | PRN

## 2018-02-07 MED ORDER — BUDESONIDE-FORMOTEROL FUMARATE 160-4.5 MCG/ACT IN AERO: INHALATION_SPRAY | RESPIRATORY_TRACT | 12 refills | Status: DC

## 2018-02-07 NOTE — Assessment & Plan Note (Signed)
Refills of his inhalers given today. Call with any concerns.

## 2018-02-07 NOTE — Assessment & Plan Note (Signed)
Not under good control. Needing to take his klonopin every other day. Will start wellbutrin for preventative medicine. Call with any concerns. Refill given. Recheck 1 month.

## 2018-02-07 NOTE — Progress Notes (Signed)
BP (!) 148/93   Pulse (!) 102   Wt 163 lb (73.9 kg)   SpO2 96%   BMI 24.64 kg/m    Subjective:    Patient ID: Timothy Finley, male    DOB: 1971-04-27, 47 y.o.   MRN: [id number]  HPI: Timothy Finley is a 47 y.o. male  Chief Complaint  Patient presents with  . Hyperlipidemia    Medication refill and lab recheck  . Abdominal Pain    Lower left side with palpitation   HYPERLIPIDEMIA Hyperlipidemia status: excellent compliance Satisfied with current treatment?  yes Side effects:  no Medication compliance: excellent compliance Past cholesterol meds: atorvastatin Supplements: none Aspirin:  yes The 10-year ASCVD risk score [name], et al., 2013) is: 15.6%   Values used to calculate the score:     Age: 89 years     Sex: Male     Is Non-Hispanic African American: No     Diabetic: No     Tobacco smoker: Yes     Systolic Blood Pressure: 099 mmHg     Is BP treated: No     HDL Cholesterol: 45 mg/dL     Total Cholesterol: 297 mg/dL Chest pain:  no Coronary artery disease:  no  ABDOMINAL PAIN  Duration: couple of weeks Onset: gradual Severity: mild Quality: dull  Location:  Mid L side  Episode duration: only with pushing on it Radiation: no Frequency: intermittent Alleviating factors: stopping pushing on it Aggravating factors: pushing on it Status: stable Treatments attempted: antacids Fever: no Nausea: no Vomiting: no Weight loss: yes- with effort Decreased appetite: no Diarrhea: no Constipation: no Blood in stool: no Heartburn: yes- with eating bread Jaundice: no Rash: no Dysuria/urinary frequency: no Hematuria: no History of sexually transmitted disease: no Recurrent NSAID use: no  ANXIETY/STRESS- needing to take his klonapin every other day. Has been on celexa in the past and it made him feel emotion-less, was on zoloft in the past Duration:uncontrolled Anxious mood: yes  Excessive worrying: yes Irritability: no  Sweating: no Nausea:  no Palpitations:no Hyperventilation: no Panic attacks: yes Agoraphobia: no  Obscessions/compulsions: no Depressed mood: yes Depression screen [facility] 2/9 10/15/2017 06/06/2017 03/28/2017 02/11/2017 12/25/2016  Decreased Interest 1 1 1 1  0  Down, Depressed, Hopeless 1 0 1 1 0  PHQ - 2 Score 2 1 2 2  0  Altered sleeping 1 2 2 1  -  Tired, decreased energy 1 2 1 1  -  Change in appetite 0 0 0 0 -  Feeling bad or failure about yourself  1 0 0 0 -  Trouble concentrating 0 0 0 0 -  Moving slowly or fidgety/restless - 0 0 0 -  Suicidal thoughts 0 0 0 0 -  PHQ-9 Score 5 5 5 4  -  Difficult doing work/chores - - Somewhat difficult - -   Anhedonia: no Weight changes: yes Insomnia: no   Hypersomnia: no Fatigue/loss of energy: yes Feelings of worthlessness: no Feelings of guilt: no Impaired concentration/indecisiveness: yes Suicidal ideations: no  Crying spells: no Recent Stressors/Life Changes: yes   Relationship problems: no   Family stress: no     Financial stress: yes    Job stress: yes    Recent death/loss: no  Relevant past medical, surgical, family and social history reviewed and updated as indicated. Interim medical history since our last visit reviewed. Allergies and medications reviewed and updated.  Review of Systems  Constitutional: Negative.   Respiratory: Negative.   Cardiovascular: Negative.  Gastrointestinal: Positive for abdominal pain. Negative for abdominal distention, anal bleeding, blood in stool, constipation, diarrhea, nausea, rectal pain and vomiting.  Skin: Negative.   Psychiatric/Behavioral: Negative.     Per HPI unless specifically indicated above     Objective:    BP (!) 148/93   Pulse (!) 102   Wt 163 lb (73.9 kg)   SpO2 96%   BMI 24.64 kg/m   Wt Readings from Last 3 Encounters:  02/07/18 163 lb (73.9 kg)  10/15/17 170 lb 8 oz (77.3 kg)  07/04/17 171 lb 6.4 oz (77.7 kg)    Physical Exam  Constitutional: He is oriented to person, place, and time.  He appears well-developed and well-nourished. No distress.  HENT:  Head: Normocephalic and atraumatic.  Right Ear: Hearing normal.  Left Ear: Hearing normal.  Nose: Nose normal.  Eyes: Pupils are equal, round, and reactive to light. Conjunctivae, EOM and lids are normal. Right eye exhibits no discharge. Left eye exhibits no discharge. No scleral icterus.  Cardiovascular: Normal rate, regular rhythm, normal heart sounds and intact distal pulses. Exam reveals no gallop and no friction rub.  No murmur heard. Pulmonary/Chest: Effort normal and breath sounds normal. No stridor. No respiratory distress. He has no wheezes. He has no rhonchi. He has no rales. He exhibits no tenderness.  Abdominal: Soft. Normal appearance, normal aorta and bowel sounds are normal. He exhibits no shifting dullness, no distension, no pulsatile liver, no fluid wave, no abdominal bruit, no ascites, no pulsatile midline mass and no mass. There is tenderness (abdominal pain on palpation of abdominal wall).  Musculoskeletal: Normal range of motion.  Neurological: He is alert and oriented to person, place, and time.  Skin: Skin is warm, dry and intact. Capillary refill takes less than 2 seconds. No rash noted. He is not diaphoretic. No cyanosis or erythema. No pallor.  Psychiatric: He has a normal mood and affect. His speech is normal and behavior is normal. Judgment and thought content normal. Cognition and memory are normal.  Nursing note and vitals reviewed.   Results for orders placed or performed in visit on 10/15/17  Comprehensive metabolic panel  Result Value Ref Range   Glucose 105 (H) 65 - 99 mg/dL   BUN 14 6 - 24 mg/dL   Creatinine, Ser 0.81 0.76 - 1.27 mg/dL   GFR calc non Af Amer 107 >59 mL/min/1.73   GFR calc Af Amer 123 >59 mL/min/1.73   BUN/Creatinine Ratio 17 9 - 20   Sodium 142 134 - 144 mmol/L   Potassium 4.5 3.5 - 5.2 mmol/L   Chloride 103 96 - 106 mmol/L   CO2 24 20 - 29 mmol/L   Calcium 9.5 8.7 -  10.2 mg/dL   Total Protein 6.9 6.0 - 8.5 g/dL   Albumin 4.5 3.5 - 5.5 g/dL   Globulin, Total 2.4 1.5 - 4.5 g/dL   Albumin/Globulin Ratio 1.9 1.2 - 2.2   Bilirubin Total 0.4 0.0 - 1.2 mg/dL   Alkaline Phosphatase 89 39 - 117 IU/L   AST 27 0 - 40 IU/L   ALT 61 (H) 0 - 44 IU/L  Lipid Panel w/o Chol/HDL Ratio  Result Value Ref Range   Cholesterol, Total 297 (H) 100 - 199 mg/dL   Triglycerides 565 (HH) 0 - 149 mg/dL   HDL 45 >39 mg/dL   VLDL Cholesterol Cal Comment 5 - 40 mg/dL   LDL Calculated Comment 0 - 99 mg/dL      Assessment & Plan:  Problem List Items Addressed This Visit      Respiratory   Asthma    Refills of his inhalers given today. Call with any concerns.       Relevant Medications   PROAIR HFA 108 (90 Base) MCG/ACT inhaler   budesonide-formoterol (SYMBICORT) 160-4.5 MCG/ACT inhaler     Other   Panic disorder    Not under good control. Needing to take his klonopin every other day. Will start wellbutrin for preventative medicine. Call with any concerns. Refill given. Recheck 1 month.       Relevant Medications   buPROPion (WELLBUTRIN SR) 150 MG 12 hr tablet   Hyperlipidemia - Primary    Rechecking levels today. Await results. Tolerating medicine well. Continue current regimen. Continue to monitor. Call with any concerns.       Relevant Medications   atorvastatin (LIPITOR) 40 MG tablet   Other Relevant Orders   Comprehensive metabolic panel   Lipid Panel w/o Chol/HDL Ratio    Other Visit Diagnoses    Abdominal pain, unspecified abdominal location       Likely abdominal muscle strain. Exercises given. Call with any concerns.    Relevant Orders   CBC with Differential/Platelet       Follow up plan: Return in about 1 month (around 03/07/2018) for follow up mood.

## 2018-02-07 NOTE — Assessment & Plan Note (Signed)
Rechecking levels today. Await results. Tolerating medicine well. Continue current regimen. Continue to monitor. Call with any concerns.

## 2018-02-08 LAB — CBC WITH DIFFERENTIAL/PLATELET
BASOS ABS: 0.1 10*3/uL (ref 0.0–0.2)
Basos: 1 %
EOS (ABSOLUTE): 0.3 10*3/uL (ref 0.0–0.4)
Eos: 3 %
Hematocrit: 45.2 % (ref 37.5–51.0)
Hemoglobin: 15.8 g/dL (ref 13.0–17.7)
Immature Grans (Abs): 0.3 10*3/uL — ABNORMAL HIGH (ref 0.0–0.1)
Immature Granulocytes: 4 %
LYMPHS ABS: 2.9 10*3/uL (ref 0.7–3.1)
Lymphs: 35 %
MCH: 34.1 pg — ABNORMAL HIGH (ref 26.6–33.0)
MCHC: 35 g/dL (ref 31.5–35.7)
MCV: 97 fL (ref 79–97)
Monocytes Absolute: 0.8 10*3/uL (ref 0.1–0.9)
Monocytes: 10 %
NEUTROS ABS: 3.8 10*3/uL (ref 1.4–7.0)
Neutrophils: 47 %
PLATELETS: 285 10*3/uL (ref 150–450)
RBC: 4.64 x10E6/uL (ref 4.14–5.80)
RDW: 14.2 % (ref 12.3–15.4)
WBC: 8.3 10*3/uL (ref 3.4–10.8)

## 2018-02-08 LAB — COMPREHENSIVE METABOLIC PANEL
ALBUMIN: 4.5 g/dL (ref 3.5–5.5)
ALT: 57 IU/L — ABNORMAL HIGH (ref 0–44)
AST: 30 IU/L (ref 0–40)
Albumin/Globulin Ratio: 1.7 (ref 1.2–2.2)
Alkaline Phosphatase: 85 IU/L (ref 39–117)
BILIRUBIN TOTAL: 0.4 mg/dL (ref 0.0–1.2)
BUN/Creatinine Ratio: 13 (ref 9–20)
BUN: 13 mg/dL (ref 6–24)
CO2: 25 mmol/L (ref 20–29)
CREATININE: 1 mg/dL (ref 0.76–1.27)
Calcium: 9.3 mg/dL (ref 8.7–10.2)
Chloride: 98 mmol/L (ref 96–106)
GFR calc Af Amer: 104 mL/min/{1.73_m2} (ref >59)
GFR, EST NON AFRICAN AMERICAN: 90 mL/min/{1.73_m2} (ref >59)
GLUCOSE: 89 mg/dL (ref 65–99)
Globulin, Total: 2.7 g/dL (ref 1.5–4.5)
Potassium: 4.1 mmol/L (ref 3.5–5.2)
Sodium: 139 mmol/L (ref 134–144)
TOTAL PROTEIN: 7.2 g/dL (ref 6.0–8.5)

## 2018-02-08 LAB — LIPID PANEL W/O CHOL/HDL RATIO
CHOLESTEROL TOTAL: 187 mg/dL (ref 100–199)
HDL: 56 mg/dL (ref >39)
TRIGLYCERIDES: 519 mg/dL — AB (ref 0–149)

## 2018-02-09 ENCOUNTER — Encounter: Payer: Self-pay | Admitting: Family Medicine

## 2018-03-24 ENCOUNTER — Ambulatory Visit: Payer: BLUE CROSS/BLUE SHIELD | Admitting: Family Medicine

## 2018-03-24 NOTE — Progress Notes (Deleted)
There were no vitals taken for this visit.   Subjective:    Patient ID: Timothy Finley, male    DOB: [birth year], 47 y.o.   MRN: [id number]  HPI: Timothy Finley is a 47 y.o. male  No chief complaint on file.  ANXIETY/STRESS Duration:{Blank single:19197::"controlled","uncontrolled","better","worse","exacerbated","stable"} Anxious mood: {Blank single:19197::"yes","no"}  Excessive worrying: {Blank single:19197::"yes","no"} Irritability: {Blank single:19197::"yes","no"}  Sweating: {Blank single:19197::"yes","no"} Nausea: {Blank single:19197::"yes","no"} Palpitations:{Blank single:19197::"yes","no"} Hyperventilation: {Blank single:19197::"yes","no"} Panic attacks: {Blank single:19197::"yes","no"} Agoraphobia: {Blank single:19197::"yes","no"}  Obscessions/compulsions: {Blank single:19197::"yes","no"} Depressed mood: {Blank single:19197::"yes","no"} Depression screen [facility] 2/9 10/15/2017 06/06/2017 03/28/2017 02/11/2017 12/25/2016  Decreased Interest 1 1 1 1  0  Down, Depressed, Hopeless 1 0 1 1 0  PHQ - 2 Score 2 1 2 2  0  Altered sleeping 1 2 2 1  -  Tired, decreased energy 1 2 1 1  -  Change in appetite 0 0 0 0 -  Feeling bad or failure about yourself  1 0 0 0 -  Trouble concentrating 0 0 0 0 -  Moving slowly or fidgety/restless - 0 0 0 -  Suicidal thoughts 0 0 0 0 -  PHQ-9 Score 5 5 5 4  -  Difficult doing work/chores - - Somewhat difficult - -   Anhedonia: {Blank single:19197::"yes","no"} Weight changes: {Blank single:19197::"yes","no"} Insomnia: {Blank single:19197::"yes","no"} {Blank single:19197::"hard to fall asleep","hard to stay asleep"}  Hypersomnia: {Blank single:19197::"yes","no"} Fatigue/loss of energy: {Blank single:19197::"yes","no"} Feelings of worthlessness: {Blank single:19197::"yes","no"} Feelings of guilt: {Blank single:19197::"yes","no"} Impaired concentration/indecisiveness: {Blank single:19197::"yes","no"} Suicidal ideations: {Blank single:19197::"yes","no"}    Crying spells: {Blank single:19197::"yes","no"} Recent Stressors/Life Changes: {Blank single:19197::"yes","no"}   Relationship problems: {Blank single:19197::"yes","no"}   Family stress: {Blank single:19197::"yes","no"}     Financial stress: {Blank single:19197::"yes","no"}    Job stress: {Blank single:19197::"yes","no"}    Recent death/loss: {Blank single:19197::"yes","no"}   Relevant past medical, surgical, family and social history reviewed and updated as indicated. Interim medical history since our last visit reviewed. Allergies and medications reviewed and updated.  Review of Systems  Per HPI unless specifically indicated above     Objective:    There were no vitals taken for this visit.  Wt Readings from Last 3 Encounters:  02/07/18 163 lb (73.9 kg)  10/15/17 170 lb 8 oz (77.3 kg)  07/04/17 171 lb 6.4 oz (77.7 kg)    Physical Exam  Results for orders placed or performed in visit on 02/07/18  Comprehensive metabolic panel  Result Value Ref Range   Glucose 89 65 - 99 mg/dL   BUN 13 6 - 24 mg/dL   Creatinine, Ser 1.00 0.76 - 1.27 mg/dL   GFR calc non Af Amer 90 >59 mL/min/1.73   GFR calc Af Amer 104 >59 mL/min/1.73   BUN/Creatinine Ratio 13 9 - 20   Sodium 139 134 - 144 mmol/L   Potassium 4.1 3.5 - 5.2 mmol/L   Chloride 98 96 - 106 mmol/L   CO2 25 20 - 29 mmol/L   Calcium 9.3 8.7 - 10.2 mg/dL   Total Protein 7.2 6.0 - 8.5 g/dL   Albumin 4.5 3.5 - 5.5 g/dL   Globulin, Total 2.7 1.5 - 4.5 g/dL   Albumin/Globulin Ratio 1.7 1.2 - 2.2   Bilirubin Total 0.4 0.0 - 1.2 mg/dL   Alkaline Phosphatase 85 39 - 117 IU/L   AST 30 0 - 40 IU/L   ALT 57 (H) 0 - 44 IU/L  Lipid Panel w/o Chol/HDL Ratio  Result Value Ref Range   Cholesterol, Total 187 100 - 199 mg/dL   Triglycerides 519 (H) 0 -  149 mg/dL   HDL 56 >39 mg/dL   VLDL Cholesterol Cal Comment 5 - 40 mg/dL   LDL Calculated Comment 0 - 99 mg/dL  CBC with Differential/Platelet  Result Value Ref Range   WBC 8.3 3.4 -  10.8 x10E3/uL   RBC 4.64 4.14 - 5.80 x10E6/uL   Hemoglobin 15.8 13.0 - 17.7 g/dL   Hematocrit 45.2 37.5 - 51.0 %   MCV 97 79 - 97 fL   MCH 34.1 (H) 26.6 - 33.0 pg   MCHC 35.0 31.5 - 35.7 g/dL   RDW 14.2 12.3 - 15.4 %   Platelets 285 150 - 450 x10E3/uL   Neutrophils 47 Not Estab. %   Lymphs 35 Not Estab. %   Monocytes 10 Not Estab. %   Eos 3 Not Estab. %   Basos 1 Not Estab. %   Neutrophils Absolute 3.8 1.4 - 7.0 x10E3/uL   Lymphocytes Absolute 2.9 0.7 - 3.1 x10E3/uL   Monocytes Absolute 0.8 0.1 - 0.9 x10E3/uL   EOS (ABSOLUTE) 0.3 0.0 - 0.4 x10E3/uL   Basophils Absolute 0.1 0.0 - 0.2 x10E3/uL   Immature Granulocytes 4 Not Estab. %   Immature Grans (Abs) 0.3 (H) 0.0 - 0.1 x10E3/uL      Assessment & Plan:   Problem List Items Addressed This Visit      Other   Panic disorder - Primary       Follow up plan: No follow-ups on file.

## 2018-04-21 ENCOUNTER — Ambulatory Visit: Payer: Self-pay | Admitting: Family Medicine

## 2018-04-21 NOTE — Telephone Encounter (Signed)
Called in c/o feeling "short of breath from the coughing and nasal congestion".   "I get allergic bronchitis every spring and fall and this is the same symptoms I get every time".  See triage notes.  I scheduled you with Carles Collet, PA for 04/22/18 at 10:45.   Reason for Disposition . [1] Known COPD or other severe lung disease (i.e., bronchiectasis, cystic fibrosis, lung surgery) AND [2] worsening symptoms (i.e., increased sputum purulence or amount, increased breathing difficulty    Has allergic bronchitis  Answer Assessment - Initial Assessment Questions 1. ONSET: "When did the cough begin?"      Thursday it started.   Coughing up yellow green mucus. 2. SEVERITY: "How bad is the cough today?"      It's keeps me up at night and I feel short of breath. 3. RESPIRATORY DISTRESS: "Describe your breathing."      I have allergic bronchitis  And I get a sinus infections every spring and fall.  These are the same symptoms. 4. FEVER: "Do you have a fever?" If so, ask: "What is your temperature, how was it measured, and when did it start?"     No 5. SPUTUM: "Describe the color of your sputum" (clear, white, yellow, green)     Yellow Green 6. HEMOPTYSIS: "Are you coughing up any blood?" If so ask: "How much?" (flecks, streaks, tablespoons, etc.)     No 7. CARDIAC HISTORY: "Do you have any history of heart disease?" (e.g., heart attack, congestive heart failure)      No 8. LUNG HISTORY: "Do you have any history of lung disease?"  (e.g., pulmonary embolus, asthma, emphysema)     Smoker.   No blood clots in lungs. 9. PE RISK FACTORS: "Do you have a history of blood clots?" (or: recent major surgery, recent prolonged travel, bedridden)     No 10. OTHER SYMPTOMS: "Do you have any other symptoms?" (e.g., runny nose, wheezing, chest pain)       Runny nose, no wheezing.  Denies sore throat or earache. 11. PREGNANCY: "Is there any chance you are pregnant?" "When was your last menstrual period?"    N/A 12. TRAVEL: "Have you traveled out of the country in the last month?" (e.g., travel history, exposures)       No  Protocols used: [place]

## 2018-04-22 ENCOUNTER — Encounter: Payer: Self-pay | Admitting: Physician Assistant

## 2018-04-22 ENCOUNTER — Ambulatory Visit: Payer: BLUE CROSS/BLUE SHIELD | Admitting: Physician Assistant

## 2018-04-22 VITALS — BP 132/92 | HR 96 | Temp 98.2°F | Wt 161.8 lb

## 2018-04-22 DIAGNOSIS — F17200 Nicotine dependence, unspecified, uncomplicated: Secondary | ICD-10-CM | POA: Diagnosis not present

## 2018-04-22 DIAGNOSIS — J40 Bronchitis, not specified as acute or chronic: Secondary | ICD-10-CM

## 2018-04-22 DIAGNOSIS — J4520 Mild intermittent asthma, uncomplicated: Secondary | ICD-10-CM | POA: Diagnosis not present

## 2018-04-22 MED ORDER — PREDNISONE 20 MG PO TABS: 20.0000 mg | ORAL_TABLET | Freq: Every day | ORAL | 0 refills | Status: AC

## 2018-04-22 MED ORDER — DOXYCYCLINE HYCLATE 100 MG PO TABS: 100.0000 mg | ORAL_TABLET | Freq: Two times a day (BID) | ORAL | 0 refills | Status: DC

## 2018-04-22 NOTE — Patient Instructions (Signed)

## 2018-04-22 NOTE — Progress Notes (Signed)
Subjective:    Patient ID: Timothy Finley, male    DOB: October 13, 1970, 47 y.o.   MRN: [id number]  Timothy Finley is a 47 y.o. male presenting on 04/22/2018 for URI (pt states he has had congestion, pressure, and productive cough for the past 5 days)   HPI   30 pack year smoking history with one week of the above symptoms. Denies fevers, chills. Takes symbicort daily and albuterol as needed.  Social History   Tobacco Use  . Smoking status: Current Every Day Smoker    Packs/day: 0.50    Types: Cigarettes  . Smokeless tobacco: Current User  Substance Use Topics  . Alcohol use: Yes    Alcohol/week: 14.0 standard drinks    Types: 14 Cans of beer per week  . Drug use: Yes    Types: Marijuana    Review of Systems Per HPI unless specifically indicated above     Objective:    BP (!) 132/92   Pulse 96   Temp 98.2 F (36.8 C) (Oral)   Wt 161 lb 12.8 oz (73.4 kg)   SpO2 98%   BMI 24.46 kg/m   Wt Readings from Last 3 Encounters:  04/22/18 161 lb 12.8 oz (73.4 kg)  02/07/18 163 lb (73.9 kg)  10/15/17 170 lb 8 oz (77.3 kg)    Physical Exam  Constitutional: He is oriented to person, place, and time. He appears well-developed and well-nourished.  Cardiovascular: Normal rate and regular rhythm.  Pulmonary/Chest: Effort normal.  Mild scattered wheezes.   Neurological: He is alert and oriented to person, place, and time.  Skin: Skin is warm and dry.  Psychiatric: He has a normal mood and affect. His behavior is normal.   Results for orders placed or performed in visit on 02/07/18  Comprehensive metabolic panel  Result Value Ref Range   Glucose 89 65 - 99 mg/dL   BUN 13 6 - 24 mg/dL   Creatinine, Ser 1.00 0.76 - 1.27 mg/dL   GFR calc non Af Amer 90 >59 mL/min/1.73   GFR calc Af Amer 104 >59 mL/min/1.73   BUN/Creatinine Ratio 13 9 - 20   Sodium 139 134 - 144 mmol/L   Potassium 4.1 3.5 - 5.2 mmol/L   Chloride 98 96 - 106 mmol/L   CO2 25 20 - 29 mmol/L   Calcium 9.3 8.7 -  10.2 mg/dL   Total Protein 7.2 6.0 - 8.5 g/dL   Albumin 4.5 3.5 - 5.5 g/dL   Globulin, Total 2.7 1.5 - 4.5 g/dL   Albumin/Globulin Ratio 1.7 1.2 - 2.2   Bilirubin Total 0.4 0.0 - 1.2 mg/dL   Alkaline Phosphatase 85 39 - 117 IU/L   AST 30 0 - 40 IU/L   ALT 57 (H) 0 - 44 IU/L  Lipid Panel w/o Chol/HDL Ratio  Result Value Ref Range   Cholesterol, Total 187 100 - 199 mg/dL   Triglycerides 519 (H) 0 - 149 mg/dL   HDL 56 >39 mg/dL   VLDL Cholesterol Cal Comment 5 - 40 mg/dL   LDL Calculated Comment 0 - 99 mg/dL  CBC with Differential/Platelet  Result Value Ref Range   WBC 8.3 3.4 - 10.8 x10E3/uL   RBC 4.64 4.14 - 5.80 x10E6/uL   Hemoglobin 15.8 13.0 - 17.7 g/dL   Hematocrit 45.2 37.5 - 51.0 %   MCV 97 79 - 97 fL   MCH 34.1 (H) 26.6 - 33.0 pg   MCHC 35.0 31.5 - 35.7 g/dL  RDW 14.2 12.3 - 15.4 %   Platelets 285 150 - 450 x10E3/uL   Neutrophils 47 Not Estab. %   Lymphs 35 Not Estab. %   Monocytes 10 Not Estab. %   Eos 3 Not Estab. %   Basos 1 Not Estab. %   Neutrophils Absolute 3.8 1.4 - 7.0 x10E3/uL   Lymphocytes Absolute 2.9 0.7 - 3.1 x10E3/uL   Monocytes Absolute 0.8 0.1 - 0.9 x10E3/uL   EOS (ABSOLUTE) 0.3 0.0 - 0.4 x10E3/uL   Basophils Absolute 0.1 0.0 - 0.2 x10E3/uL   Immature Granulocytes 4 Not Estab. %   Immature Grans (Abs) 0.3 (H) 0.0 - 0.1 x10E3/uL      Assessment & Plan:  1. Bronchitis  Will treat as COPD exacerbation due to smoking history and symptoms.  - doxycycline (VIBRA-TABS) 100 MG tablet; Take 1 tablet (100 mg total) by mouth 2 (two) times daily.  Dispense: 20 tablet; Refill: 0 - predniSONE (DELTASONE) 20 MG tablet; Take 1 tablet (20 mg total) by mouth daily with breakfast for 5 days.  Dispense: 5 tablet; Refill: 0  2. Mild intermittent asthma without complication  - doxycycline (VIBRA-TABS) 100 MG tablet; Take 1 tablet (100 mg total) by mouth 2 (two) times daily.  Dispense: 20 tablet; Refill: 0 - predniSONE (DELTASONE) 20 MG tablet; Take 1 tablet (20 mg  total) by mouth daily with breakfast for 5 days.  Dispense: 5 tablet; Refill: 0  3. CIGARETTE SMOKER  - doxycycline (VIBRA-TABS) 100 MG tablet; Take 1 tablet (100 mg total) by mouth 2 (two) times daily.  Dispense: 20 tablet; Refill: 0 - predniSONE (DELTASONE) 20 MG tablet; Take 1 tablet (20 mg total) by mouth daily with breakfast for 5 days.  Dispense: 5 tablet; Refill: 0    Follow up plan: Return if symptoms worsen or fail to improve.  Carles Collet, PA-C [facility] 04/23/2018, 12:23 PM

## 2018-07-11 ENCOUNTER — Telehealth: Payer: Self-pay | Admitting: Family Medicine

## 2018-07-11 MED ORDER — ATORVASTATIN CALCIUM 40 MG PO TABS: 40.0000 mg | ORAL_TABLET | Freq: Every day | ORAL | 1 refills | Status: DC

## 2018-07-11 NOTE — Telephone Encounter (Signed)
Copied from [place] [phone] [phone]. Topic: Quick Communication - Rx Refill/Question >> Jul 11, 2018  2:00 PM Alexander, [employer] L wrote: Medication:  atorvastatin (LIPITOR) 40 MG tablet clonazePAM (KLONOPIN) 0.5 MG tablet  Has the patient contacted their pharmacy? Yes - told him to contact PCP (Agent: If no, request that the patient contact the pharmacy for the refill.) (Agent: If yes, when and what did the pharmacy advise?)  Preferred Pharmacy (with phone number or street name): [id number] [address], [place] [phone] (Phone) [phone] (Fax)  Agent: Please be advised that RX refills may take up to 3 business days. We ask that you follow-up with your pharmacy.

## 2018-07-11 NOTE — Telephone Encounter (Signed)
Requested medication (s) are due for refill today: yes  Requested medication (s) are on the active medication list: yes  Last refill:  02/07/18 #60 with 1 refill  Future visit scheduled: No  Notes to clinic:  Unable to refill per protocol    Requested Prescriptions  Pending Prescriptions Disp Refills   clonazePAM (KLONOPIN) 0.5 MG tablet 60 tablet 1    Sig: Take 1 tablet (0.5 mg total) by mouth 2 (two) times daily as needed for anxiety.     Not Delegated - Psychiatry:  Anxiolytics/Hypnotics Failed - 07/11/2018  2:30 PM      Failed - This refill cannot be delegated      Failed - Urine Drug Screen completed in last 360 days.      Passed - Valid encounter within last 6 months    Recent Outpatient Visits          2 months ago [place], [place], [place]   5 months ago Mixed hyperlipidemia   [facility] [place], [place], [place]   8 months ago Mixed hyperlipidemia   [facility] [place], Megan P, DO   1 year ago Bronchitis, allergic, unspecified asthma severity, with acute exacerbation   [place], [place]   1 year ago Upper respiratory tract infection, unspecified type   [facility] [place], [place]           Signed Prescriptions Disp Refills   atorvastatin (LIPITOR) 40 MG tablet 90 tablet 1    Sig: Take 1 tablet (40 mg total) by mouth daily.     Cardiovascular:  Antilipid - Statins Failed - 07/11/2018  2:30 PM      Failed - Triglycerides in normal range and within 360 days    Triglycerides  Date Value Ref Range Status  02/07/2018 519 (H) 0 - 149 mg/dL Final         Passed - Total Cholesterol in normal range and within 360 days    Cholesterol, Total  Date Value Ref Range Status  02/07/2018 187 100 - 199 mg/dL Final         Passed - LDL in normal range and within 360 days    LDL Calculated  Date Value Ref Range Status  02/07/2018 Comment 0 - 99  mg/dL Final    Comment:    Triglyceride result indicated is too high for an accurate LDL cholesterol estimation.          Passed - HDL in normal range and within 360 days    HDL  Date Value Ref Range Status  02/07/2018 56 >39 mg/dL Final         Passed - Patient is not pregnant      Passed - Valid encounter within last 12 months    Recent Outpatient Visits          2 months ago [place], Adriana M, PA-C   5 months ago Mixed hyperlipidemia   [facility] [place], [place], DO   8 months ago Mixed hyperlipidemia   [employer], Megan P, DO   1 year ago Bronchitis, allergic, unspecified asthma severity, with acute exacerbation   [facility] [place], [place]   1 year ago Upper respiratory tract infection, unspecified type   [facility], [place], [place]

## 2018-07-14 ENCOUNTER — Encounter: Payer: Self-pay | Admitting: Family Medicine

## 2018-07-14 NOTE — Telephone Encounter (Signed)
Patient called in and scheduled the earliest avail 08/05/2018. He will be out of med before he can come to appt can it be refilled until he can be seen ?

## 2018-07-14 NOTE — Telephone Encounter (Signed)
Unfortunately he was supposed to come back in August, and did not. I will not be able to give him a refill on the klonopin with out being seen. He can see someone else if they have availability.

## 2018-07-14 NOTE — Telephone Encounter (Signed)
LVM for pt to call back and also printed letter to mail.

## 2018-07-15 NOTE — Telephone Encounter (Signed)
Called and left a detailed message letting patient know Dr.Johnson's response.  

## 2018-07-18 ENCOUNTER — Encounter: Payer: Self-pay | Admitting: Family Medicine

## 2018-07-18 ENCOUNTER — Ambulatory Visit: Payer: BLUE CROSS/BLUE SHIELD | Admitting: Family Medicine

## 2018-07-18 VITALS — BP 138/98 | HR 105 | Temp 98.4°F | Ht 68.6 in | Wt 168.5 lb

## 2018-07-18 DIAGNOSIS — J0100 Acute maxillary sinusitis, unspecified: Secondary | ICD-10-CM

## 2018-07-18 DIAGNOSIS — J3089 Other allergic rhinitis: Secondary | ICD-10-CM | POA: Diagnosis not present

## 2018-07-18 DIAGNOSIS — F410 Panic disorder [episodic paroxysmal anxiety] without agoraphobia: Secondary | ICD-10-CM

## 2018-07-18 DIAGNOSIS — J4521 Mild intermittent asthma with (acute) exacerbation: Secondary | ICD-10-CM

## 2018-07-18 MED ORDER — DOXYCYCLINE HYCLATE 100 MG PO TABS: 100.0000 mg | ORAL_TABLET | Freq: Two times a day (BID) | ORAL | 0 refills | Status: DC

## 2018-07-18 MED ORDER — CLONAZEPAM 0.5 MG PO TABS: 0.5000 mg | ORAL_TABLET | Freq: Two times a day (BID) | ORAL | 1 refills | Status: DC | PRN

## 2018-07-18 MED ORDER — PROAIR HFA 108 (90 BASE) MCG/ACT IN AERS: INHALATION_SPRAY | RESPIRATORY_TRACT | 6 refills | Status: DC

## 2018-07-18 MED ORDER — BUPROPION HCL ER (XL) 300 MG PO TB24: 300.0000 mg | ORAL_TABLET | Freq: Every day | ORAL | 3 refills | Status: DC

## 2018-07-18 NOTE — Progress Notes (Signed)
BP (!) 138/98 (BP Location: Left Arm, Cuff Size: Normal)   Pulse (!) 105   Temp 98.4 F (36.9 C) (Oral)   Ht 5' 8.6" (1.742 m)   Wt 168 lb 8 oz (76.4 kg)   SpO2 97%   BMI 25.17 kg/m    Subjective:    Patient ID: Timothy Finley, male    DOB: 1971/03/21, 47 y.o.   MRN: [id number]  HPI: Timothy Finley is a 47 y.o. male  Chief Complaint  Patient presents with  . Panic Disorder  . URI    pt states he has been having congestion, pressure, ear ache and cough for about a week    Here today for follow up for his anxiety with panic episodes. Taking wellbutrin without issue, but not feeling much of an impact on it. Taking the klonopin once every few weeks sometimes two times a week. No concerns today with this. Denies SI/HI.   Also dealing with over a week of congestion, facial pain and pressure, right ear pain, and productive cough. Hx of allergies and asthma, taking proair, symbicort, zyrtec for those. Not trying anything OTC for sxs. Denies fevers, chills, CP, SOB. Several sick contacts.   Depression screen [facility] 2/9 07/18/2018 10/15/2017 06/06/2017  Decreased Interest 1 1 1   Down, Depressed, Hopeless 1 1 0  PHQ - 2 Score 2 2 1   Altered sleeping 1 1 2   Tired, decreased energy 1 1 2   Change in appetite - 0 0  Feeling bad or failure about yourself  0 1 0  Trouble concentrating 0 0 0  Moving slowly or fidgety/restless 0 - 0  Suicidal thoughts 0 0 0  PHQ-9 Score 4 5 5   Difficult doing work/chores - - -   GAD 7 : Generalized Anxiety Score 07/18/2018 10/15/2017 06/06/2017 03/28/2017  Nervous, Anxious, on Edge 2 1 1 1   Control/stop worrying 1 0 1 1  Worry too much - different things 1 0 1 1  Trouble relaxing 0 0 1 0  Restless 0 0 0 0  Easily annoyed or irritable 0 1 1 0  Afraid - awful might happen 0 0 0 0  Total GAD 7 Score 4 2 5 3   Anxiety Difficulty - - - Somewhat difficult     Congestion, right ear pain, cough, wheezing x 1 week. Taking mucinex, cough syrups, regular allergy  and asthma medications.   Relevant past medical, surgical, family and social history reviewed and updated as indicated. Interim medical history since our last visit reviewed. Allergies and medications reviewed and updated.  Review of Systems  Per HPI unless specifically indicated above     Objective:    BP (!) 138/98 (BP Location: Left Arm, Cuff Size: Normal)   Pulse (!) 105   Temp 98.4 F (36.9 C) (Oral)   Ht 5' 8.6" (1.742 m)   Wt 168 lb 8 oz (76.4 kg)   SpO2 97%   BMI 25.17 kg/m   Wt Readings from Last 3 Encounters:  07/18/18 168 lb 8 oz (76.4 kg)  04/22/18 161 lb 12.8 oz (73.4 kg)  02/07/18 163 lb (73.9 kg)    Physical Exam Vitals signs and nursing note reviewed.  Constitutional:      Appearance: Normal appearance.  HENT:     Head: Atraumatic.     Comments: B/l maxillary sinuses ttp    Ears:     Comments: Right TM injected and mildly edematous    Nose: Congestion present.  Mouth/Throat:     Pharynx: Posterior oropharyngeal erythema present.  Eyes:     Extraocular Movements: Extraocular movements intact.     Conjunctiva/sclera: Conjunctivae normal.  Neck:     Musculoskeletal: Normal range of motion and neck supple.  Cardiovascular:     Rate and Rhythm: Normal rate and regular rhythm.     Pulses: Normal pulses.     Heart sounds: Normal heart sounds.  Pulmonary:     Effort: Pulmonary effort is normal.     Breath sounds: Wheezing present. No rales.  Musculoskeletal: Normal range of motion.  Lymphadenopathy:     Cervical: Cervical adenopathy present.  Skin:    General: Skin is warm and dry.  Neurological:     General: No focal deficit present.     Mental Status: He is alert and oriented to person, place, and time.  Psychiatric:        Mood and Affect: Mood normal.        Thought Content: Thought content normal.        Judgment: Judgment normal.     Results for orders placed or performed in visit on 02/07/18  Comprehensive metabolic panel  Result  Value Ref Range   Glucose 89 65 - 99 mg/dL   BUN 13 6 - 24 mg/dL   Creatinine, Ser 1.00 0.76 - 1.27 mg/dL   GFR calc non Af Amer 90 >59 mL/min/1.73   GFR calc Af Amer 104 >59 mL/min/1.73   BUN/Creatinine Ratio 13 9 - 20   Sodium 139 134 - 144 mmol/L   Potassium 4.1 3.5 - 5.2 mmol/L   Chloride 98 96 - 106 mmol/L   CO2 25 20 - 29 mmol/L   Calcium 9.3 8.7 - 10.2 mg/dL   Total Protein 7.2 6.0 - 8.5 g/dL   Albumin 4.5 3.5 - 5.5 g/dL   Globulin, Total 2.7 1.5 - 4.5 g/dL   Albumin/Globulin Ratio 1.7 1.2 - 2.2   Bilirubin Total 0.4 0.0 - 1.2 mg/dL   Alkaline Phosphatase 85 39 - 117 IU/L   AST 30 0 - 40 IU/L   ALT 57 (H) 0 - 44 IU/L  Lipid Panel w/o Chol/HDL Ratio  Result Value Ref Range   Cholesterol, Total 187 100 - 199 mg/dL   Triglycerides 519 (H) 0 - 149 mg/dL   HDL 56 >39 mg/dL   VLDL Cholesterol Cal Comment 5 - 40 mg/dL   LDL Calculated Comment 0 - 99 mg/dL  CBC with Differential/Platelet  Result Value Ref Range   WBC 8.3 3.4 - 10.8 x10E3/uL   RBC 4.64 4.14 - 5.80 x10E6/uL   Hemoglobin 15.8 13.0 - 17.7 g/dL   Hematocrit 45.2 37.5 - 51.0 %   MCV 97 79 - 97 fL   MCH 34.1 (H) 26.6 - 33.0 pg   MCHC 35.0 31.5 - 35.7 g/dL   RDW 14.2 12.3 - 15.4 %   Platelets 285 150 - 450 x10E3/uL   Neutrophils 47 Not Estab. %   Lymphs 35 Not Estab. %   Monocytes 10 Not Estab. %   Eos 3 Not Estab. %   Basos 1 Not Estab. %   Neutrophils Absolute 3.8 1.4 - 7.0 x10E3/uL   Lymphocytes Absolute 2.9 0.7 - 3.1 x10E3/uL   Monocytes Absolute 0.8 0.1 - 0.9 x10E3/uL   EOS (ABSOLUTE) 0.3 0.0 - 0.4 x10E3/uL   Basophils Absolute 0.1 0.0 - 0.2 x10E3/uL   Immature Granulocytes 4 Not Estab. %   Immature Grans (Abs) 0.3 (H) 0.0 -  0.1 x10E3/uL      Assessment & Plan:   Problem List Items Addressed This Visit      Respiratory   Allergic rhinitis    Continue allergy regimen, will start doxycycline for sinusitis      Asthma    Continue inhalers, supportive care. Start doxycycline for URI. Follow up if  worsening sxs, may need to add prednisone      Relevant Medications   PROAIR HFA 108 (90 Base) MCG/ACT inhaler     Other   Panic disorder - Primary    Will increase wellbutrin to 300 mg XL and continue prn use of klonopin. Knows the klonopin should last him about 6 months      Relevant Medications   buPROPion (WELLBUTRIN XL) 300 MG 24 hr tablet    Other Visit Diagnoses    Acute maxillary sinusitis, recurrence not specified       Doxycycline, mucinex, sinus rinses, continued allergy/asthma regimen   Relevant Medications   doxycycline (VIBRA-TABS) 100 MG tablet       Follow up plan: Return in about 4 weeks (around 08/15/2018) for CPE with Dr. Wynetta Emery.

## 2018-07-21 NOTE — Assessment & Plan Note (Signed)
Will increase wellbutrin to 300 mg XL and continue prn use of klonopin. Knows the klonopin should last him about 6 months

## 2018-07-21 NOTE — Assessment & Plan Note (Signed)
Continue inhalers, supportive care. Start doxycycline for URI. Follow up if worsening sxs, may need to add prednisone

## 2018-07-21 NOTE — Assessment & Plan Note (Signed)
Continue allergy regimen, will start doxycycline for sinusitis

## 2018-08-05 ENCOUNTER — Ambulatory Visit: Payer: Self-pay | Admitting: Family Medicine

## 2018-08-10 ENCOUNTER — Other Ambulatory Visit: Payer: Self-pay | Admitting: Family Medicine

## 2018-08-11 NOTE — Telephone Encounter (Signed)
Requested medication (s) are due for refill today: Yes  Requested medication (s) are on the active medication list: Yes  Last refill:  07/18/18  Future visit scheduled: No  Notes to clinic:  Unable to refill per protocol, patient will need a CPE scheduled (no show for appointment)     Requested Prescriptions  Pending Prescriptions Disp Refills   buPROPion (WELLBUTRIN XL) 300 MG 24 hr tablet [Pharmacy Med Name: BUPROPION HCL XL 300 MG TABLET] 90 tablet 2    Sig: TAKE 1 [place]     Psychiatry: Antidepressants - bupropion Failed - 08/11/2018 11:53 AM      Failed - Last BP in normal range    BP Readings from Last 1 Encounters:  07/18/18 (!) 138/98         Passed - Valid encounter within last 6 months    Recent Outpatient Visits          3 weeks ago Panic disorder   [facility] [place], [place]   3 months ago [place], [place], [place]   6 months ago Mixed hyperlipidemia   [facility] Mayer, Megan P, DO   10 months ago Mixed hyperlipidemia   [facility] Anthony, Megan P, DO   1 year ago Bronchitis, allergic, unspecified asthma severity, with acute exacerbation   [facility] [place], [place]

## 2018-12-11 ENCOUNTER — Other Ambulatory Visit: Payer: Self-pay | Admitting: Family Medicine

## 2018-12-11 NOTE — Telephone Encounter (Signed)
Requested medication (s) are due for refill today: yes  Requested medication (s) are on the active medication list: yes  Last refill:  07/18/18  Future visit scheduled: no  Notes to clinic:  Medication not delegated to NT to refill   Requested Prescriptions  Pending Prescriptions Disp Refills   clonazePAM (KLONOPIN) 0.5 MG tablet [Pharmacy Med Name: CLONAZEPAM 0.5 MG TABLET] 60 tablet 1    Sig: Take 1 tablet (0.5 mg total) by mouth 2 (two) times daily as needed for anxiety.     Not Delegated - Psychiatry:  Anxiolytics/Hypnotics Failed - 12/11/2018  1:00 PM      Failed - This refill cannot be delegated      Failed - Urine Drug Screen completed in last 360 days.      Passed - Valid encounter within last 6 months    Recent Outpatient Visits          4 months ago Panic disorder   [place], [place], [place]   7 months ago [place], [place], [place]   10 months ago Mixed hyperlipidemia   [facility] [place], [place], DO   1 year ago Mixed hyperlipidemia   [facility] [place], Megan P, DO   1 year ago Bronchitis, allergic, unspecified asthma severity, with acute exacerbation   [facility] [place] Roof [place], [place]

## 2018-12-17 ENCOUNTER — Encounter: Payer: Self-pay | Admitting: Family Medicine

## 2018-12-17 ENCOUNTER — Ambulatory Visit (INDEPENDENT_AMBULATORY_CARE_PROVIDER_SITE_OTHER): Payer: BLUE CROSS/BLUE SHIELD | Admitting: Family Medicine

## 2018-12-17 ENCOUNTER — Other Ambulatory Visit: Payer: Self-pay

## 2018-12-17 VITALS — Wt 163.0 lb

## 2018-12-17 DIAGNOSIS — F410 Panic disorder [episodic paroxysmal anxiety] without agoraphobia: Secondary | ICD-10-CM

## 2018-12-17 MED ORDER — PROAIR HFA 108 (90 BASE) MCG/ACT IN AERS: INHALATION_SPRAY | RESPIRATORY_TRACT | 6 refills | Status: DC

## 2018-12-17 MED ORDER — CLONAZEPAM 0.5 MG PO TABS: 0.5000 mg | ORAL_TABLET | Freq: Two times a day (BID) | ORAL | 1 refills | Status: DC | PRN

## 2018-12-17 NOTE — Assessment & Plan Note (Signed)
Under good control on current regimen. Continue current regimen. Continue to monitor. Call with any concerns. Refills given. He is aware that refills should last 6 months. Call with any concerns.

## 2018-12-17 NOTE — Progress Notes (Signed)
Wt 163 lb (73.9 kg)   BMI 24.35 kg/m    Subjective:    Patient ID: Timothy Finley, male    DOB: [birth year], 48 y.o.   MRN: [id number]  HPI: Timothy Finley is a 48 y.o. male  Chief Complaint  Patient presents with  . Anxiety   ANXIETY/STRESS- stopped his wellbutrin, no particular reason. Stress has been worse due to work. Has been taking his klonopin about 1x a week. Still seems to be working.  Duration:stable Anxious mood: yes  Excessive worrying: yes Irritability: yes  Sweating: no Nausea: no Palpitations:no Hyperventilation: no Panic attacks: no Agoraphobia: no  Obscessions/compulsions: no Depressed mood: no Depression screen [facility] 2/9 12/17/2018 07/18/2018 10/15/2017 06/06/2017 03/28/2017  Decreased Interest 0 1 1 1 1   Down, Depressed, Hopeless 0 1 1 0 1  PHQ - 2 Score 0 2 2 1 2   Altered sleeping 1 1 1 2 2   Tired, decreased energy 1 1 1 2 1   Change in appetite 0 - 0 0 0  Feeling bad or failure about yourself  0 0 1 0 0  Trouble concentrating 0 0 0 0 0  Moving slowly or fidgety/restless 0 0 - 0 0  Suicidal thoughts 0 0 0 0 0  PHQ-9 Score 2 4 5 5 5   Difficult doing work/chores Not difficult at all - - - Somewhat difficult  Some recent data might be hidden   GAD 7 : Generalized Anxiety Score 12/17/2018 07/18/2018 10/15/2017 06/06/2017  Nervous, Anxious, on Edge 1 2 1 1   Control/stop worrying 1 1 0 1  Worry too much - different things 1 1 0 1  Trouble relaxing 1 0 0 1  Restless 0 0 0 0  Easily annoyed or irritable 0 0 1 1  Afraid - awful might happen 0 0 0 0  Total GAD 7 Score 4 4 2 5   Anxiety Difficulty Somewhat difficult - - -   Anhedonia: no Weight changes: no Insomnia: yes hard to stay asleep  Hypersomnia: no Fatigue/loss of energy: yes Feelings of worthlessness: no Feelings of guilt: no Impaired concentration/indecisiveness: no Suicidal ideations: no  Crying spells: no Recent Stressors/Life Changes: yes   Relationship problems: no   Family stress: yes      Financial stress: yes    Job stress: yes    Recent death/loss: no  Relevant past medical, surgical, family and social history reviewed and updated as indicated. Interim medical history since our last visit reviewed. Allergies and medications reviewed and updated.  Review of Systems  Constitutional: Negative.   Respiratory: Negative.   Cardiovascular: Negative.   Musculoskeletal: Negative.   Skin: Negative.   Neurological: Negative.   Psychiatric/Behavioral: Negative for agitation, behavioral problems, confusion, decreased concentration, dysphoric mood, hallucinations, self-injury, sleep disturbance and suicidal ideas. The patient is nervous/anxious. The patient is not hyperactive.     Per HPI unless specifically indicated above     Objective:    Wt 163 lb (73.9 kg)   BMI 24.35 kg/m   Wt Readings from Last 3 Encounters:  12/17/18 163 lb (73.9 kg)  07/18/18 168 lb 8 oz (76.4 kg)  04/22/18 161 lb 12.8 oz (73.4 kg)    Physical Exam Vitals signs and nursing note reviewed.  Constitutional:      General: He is not in acute distress.    Appearance: Normal appearance. He is not ill-appearing, toxic-appearing or diaphoretic.  HENT:     Head: Normocephalic and atraumatic.     Right Ear:  External ear normal.     Left Ear: External ear normal.     Nose: Nose normal.     Mouth/Throat:     Mouth: Mucous membranes are moist.     Pharynx: Oropharynx is clear.  Eyes:     General: No scleral icterus.       Right eye: No discharge.        Left eye: No discharge.     Conjunctiva/sclera: Conjunctivae normal.     Pupils: Pupils are equal, round, and reactive to light.  Neck:     Musculoskeletal: Normal range of motion.  Pulmonary:     Effort: Pulmonary effort is normal. No respiratory distress.     Comments: Speaking in full sentences Musculoskeletal: Normal range of motion.  Skin:    Coloration: Skin is not jaundiced or pale.     Findings: No bruising, erythema, lesion or  rash.  Neurological:     Mental Status: He is alert and oriented to person, place, and time. Mental status is at baseline.  Psychiatric:        Mood and Affect: Mood normal.        Behavior: Behavior normal.        Thought Content: Thought content normal.        Judgment: Judgment normal.     Results for orders placed or performed in visit on 02/07/18  Comprehensive metabolic panel  Result Value Ref Range   Glucose 89 65 - 99 mg/dL   BUN 13 6 - 24 mg/dL   Creatinine, Ser 1.00 0.76 - 1.27 mg/dL   GFR calc non Af Amer 90 >59 mL/min/1.73   GFR calc Af Amer 104 >59 mL/min/1.73   BUN/Creatinine Ratio 13 9 - 20   Sodium 139 134 - 144 mmol/L   Potassium 4.1 3.5 - 5.2 mmol/L   Chloride 98 96 - 106 mmol/L   CO2 25 20 - 29 mmol/L   Calcium 9.3 8.7 - 10.2 mg/dL   Total Protein 7.2 6.0 - 8.5 g/dL   Albumin 4.5 3.5 - 5.5 g/dL   Globulin, Total 2.7 1.5 - 4.5 g/dL   Albumin/Globulin Ratio 1.7 1.2 - 2.2   Bilirubin Total 0.4 0.0 - 1.2 mg/dL   Alkaline Phosphatase 85 39 - 117 IU/L   AST 30 0 - 40 IU/L   ALT 57 (H) 0 - 44 IU/L  Lipid Panel w/o Chol/HDL Ratio  Result Value Ref Range   Cholesterol, Total 187 100 - 199 mg/dL   Triglycerides 519 (H) 0 - 149 mg/dL   HDL 56 >39 mg/dL   VLDL Cholesterol Cal Comment 5 - 40 mg/dL   LDL Calculated Comment 0 - 99 mg/dL  CBC with Differential/Platelet  Result Value Ref Range   WBC 8.3 3.4 - 10.8 x10E3/uL   RBC 4.64 4.14 - 5.80 x10E6/uL   Hemoglobin 15.8 13.0 - 17.7 g/dL   Hematocrit 45.2 37.5 - 51.0 %   MCV 97 79 - 97 fL   MCH 34.1 (H) 26.6 - 33.0 pg   MCHC 35.0 31.5 - 35.7 g/dL   RDW 14.2 12.3 - 15.4 %   Platelets 285 150 - 450 x10E3/uL   Neutrophils 47 Not Estab. %   Lymphs 35 Not Estab. %   Monocytes 10 Not Estab. %   Eos 3 Not Estab. %   Basos 1 Not Estab. %   Neutrophils Absolute 3.8 1.4 - 7.0 x10E3/uL   Lymphocytes Absolute 2.9 0.7 - 3.1 x10E3/uL   Monocytes  Absolute 0.8 0.1 - 0.9 x10E3/uL   EOS (ABSOLUTE) 0.3 0.0 - 0.4 x10E3/uL    Basophils Absolute 0.1 0.0 - 0.2 x10E3/uL   Immature Granulocytes 4 Not Estab. %   Immature Grans (Abs) 0.3 (H) 0.0 - 0.1 x10E3/uL      Assessment & Plan:   Problem List Items Addressed This Visit      Other   Panic disorder - Primary    Under good control on current regimen. Continue current regimen. Continue to monitor. Call with any concerns. Refills given. He is aware that refills should last 6 months. Call with any concerns.            Follow up plan: Return in about 6 months (around 06/19/2019) for Physical.    . This visit was completed via FaceTime due to the restrictions of the COVID-19 pandemic. All issues as above were discussed and addressed. Physical exam was done as above through visual confirmation on FaceTime. If it was felt that the patient should be evaluated in the office, they were directed there. The patient verbally consented to this visit. . Location of the patient: home . Location of the provider: home . Those involved with this call:  . Provider: Park Liter, DO . CMA: Tiffany Reel, CMA . Front Desk/Registration: Linard Millers  . Time spent on call: 15 minutes with patient face to face via video conference. More than 50% of this time was spent in counseling and coordination of care. 23 minutes total spent in review of patient's record and preparation of their chart.

## 2018-12-18 ENCOUNTER — Telehealth: Payer: Self-pay | Admitting: Family Medicine

## 2018-12-18 NOTE — Telephone Encounter (Signed)
Spoke with the patient. He will call back to schedule his CPE.

## 2018-12-22 ENCOUNTER — Other Ambulatory Visit: Payer: Self-pay | Admitting: Family Medicine

## 2018-12-24 ENCOUNTER — Encounter: Payer: Self-pay | Admitting: Family Medicine

## 2018-12-24 ENCOUNTER — Ambulatory Visit (INDEPENDENT_AMBULATORY_CARE_PROVIDER_SITE_OTHER): Payer: BLUE CROSS/BLUE SHIELD | Admitting: Family Medicine

## 2018-12-24 ENCOUNTER — Other Ambulatory Visit: Payer: Self-pay

## 2018-12-24 VITALS — HR 90

## 2018-12-24 DIAGNOSIS — J0100 Acute maxillary sinusitis, unspecified: Secondary | ICD-10-CM

## 2018-12-24 MED ORDER — PREDNISONE 20 MG PO TABS: 40.0000 mg | ORAL_TABLET | Freq: Every day | ORAL | 0 refills | Status: DC

## 2018-12-24 MED ORDER — DOXYCYCLINE HYCLATE 100 MG PO TABS: 100.0000 mg | ORAL_TABLET | Freq: Two times a day (BID) | ORAL | 0 refills | Status: DC

## 2018-12-24 NOTE — Progress Notes (Signed)
Pulse 90   SpO2 97%    Subjective:    Patient ID: Timothy Finley, male    DOB: 1970/11/13, 48 y.o.   MRN: [id number]  HPI: Timothy Finley is a 48 y.o. male  Chief Complaint  Patient presents with  . Cough  . Sore Throat    Started on Saturday.     . This visit was completed via WebEx due to the restrictions of the COVID-19 pandemic. All issues as above were discussed and addressed. Physical exam was done as above through visual confirmation on WebEx. If it was felt that the patient should be evaluated in the office, they were directed there. The patient verbally consented to this visit. . Location of the patient: home . Location of the provider: home . Those involved with this call:  . Provider: Merrie Roof, PA-C . CMA: Yvonna Alanis, [place] . Front Desk/Registration: Jill Side  . Time spent on call: 15 minutes with patient face to face via video conference. More than 50% of this time was spent in counseling and coordination of care. 5 minutes total spent in review of patient's record and preparation of their chart.   I verified patient identity using two factors (patient name and date of birth). Patient consents verbally to being seen via telemedicine visit today.   Patient presenting today with almost a week of worsening sinus pain, pressure, thick congestion, post nasal drainage and sore throat. Now also having sinus headaches and fatigue. Denies fever, chills, myalgias, sick contacts, significant cough, SOB. Hx of allergies, compliant with zyrtec daily. Has also been trying mucinex with no relief.   Relevant past medical, surgical, family and social history reviewed and updated as indicated. Interim medical history since our last visit reviewed. Allergies and medications reviewed and updated.  Review of Systems  Per HPI unless specifically indicated above     Objective:    Pulse 90   SpO2 97%   Wt Readings from Last 3 Encounters:  12/17/18 163 lb (73.9 kg)   07/18/18 168 lb 8 oz (76.4 kg)  04/22/18 161 lb 12.8 oz (73.4 kg)    Physical Exam Vitals signs and nursing note reviewed.  Constitutional:      General: He is not in acute distress.    Appearance: Normal appearance.  HENT:     Head: Atraumatic.     Right Ear: External ear normal.     Left Ear: External ear normal.     Nose: Congestion present.     Mouth/Throat:     Mouth: Mucous membranes are moist.     Pharynx: Oropharynx is clear. Posterior oropharyngeal erythema present.  Eyes:     Extraocular Movements: Extraocular movements intact.     Conjunctiva/sclera: Conjunctivae normal.  Neck:     Musculoskeletal: Normal range of motion.  Cardiovascular:     Rate and Rhythm: Normal rate and regular rhythm.  Pulmonary:     Effort: Pulmonary effort is normal. No respiratory distress.  Musculoskeletal: Normal range of motion.  Skin:    General: Skin is dry.     Findings: No erythema or rash.  Neurological:     Mental Status: He is oriented to person, place, and time.  Psychiatric:        Mood and Affect: Mood normal.        Thought Content: Thought content normal.        Judgment: Judgment normal.     Results for orders placed or performed in visit on  02/07/18  Comprehensive metabolic panel  Result Value Ref Range   Glucose 89 65 - 99 mg/dL   BUN 13 6 - 24 mg/dL   Creatinine, Ser 1.00 0.76 - 1.27 mg/dL   GFR calc non Af Amer 90 >59 mL/min/1.73   GFR calc Af Amer 104 >59 mL/min/1.73   BUN/Creatinine Ratio 13 9 - 20   Sodium 139 134 - 144 mmol/L   Potassium 4.1 3.5 - 5.2 mmol/L   Chloride 98 96 - 106 mmol/L   CO2 25 20 - 29 mmol/L   Calcium 9.3 8.7 - 10.2 mg/dL   Total Protein 7.2 6.0 - 8.5 g/dL   Albumin 4.5 3.5 - 5.5 g/dL   Globulin, Total 2.7 1.5 - 4.5 g/dL   Albumin/Globulin Ratio 1.7 1.2 - 2.2   Bilirubin Total 0.4 0.0 - 1.2 mg/dL   Alkaline Phosphatase 85 39 - 117 IU/L   AST 30 0 - 40 IU/L   ALT 57 (H) 0 - 44 IU/L  Lipid Panel w/o Chol/HDL Ratio  Result Value  Ref Range   Cholesterol, Total 187 100 - 199 mg/dL   Triglycerides 519 (H) 0 - 149 mg/dL   HDL 56 >39 mg/dL   VLDL Cholesterol Cal Comment 5 - 40 mg/dL   LDL Calculated Comment 0 - 99 mg/dL  CBC with Differential/Platelet  Result Value Ref Range   WBC 8.3 3.4 - 10.8 x10E3/uL   RBC 4.64 4.14 - 5.80 x10E6/uL   Hemoglobin 15.8 13.0 - 17.7 g/dL   Hematocrit 45.2 37.5 - 51.0 %   MCV 97 79 - 97 fL   MCH 34.1 (H) 26.6 - 33.0 pg   MCHC 35.0 31.5 - 35.7 g/dL   RDW 14.2 12.3 - 15.4 %   Platelets 285 150 - 450 x10E3/uL   Neutrophils 47 Not Estab. %   Lymphs 35 Not Estab. %   Monocytes 10 Not Estab. %   Eos 3 Not Estab. %   Basos 1 Not Estab. %   Neutrophils Absolute 3.8 1.4 - 7.0 x10E3/uL   Lymphocytes Absolute 2.9 0.7 - 3.1 x10E3/uL   Monocytes Absolute 0.8 0.1 - 0.9 x10E3/uL   EOS (ABSOLUTE) 0.3 0.0 - 0.4 x10E3/uL   Basophils Absolute 0.1 0.0 - 0.2 x10E3/uL   Immature Granulocytes 4 Not Estab. %   Immature Grans (Abs) 0.3 (H) 0.0 - 0.1 x10E3/uL      Assessment & Plan:   Problem List Items Addressed This Visit    None    Visit Diagnoses    Acute maxillary sinusitis, recurrence not specified    -  Primary   Tx with doxycycline, mucinex, sinus rinses, and continued allergy regimen. F/u if worsening or not improving   Relevant Medications   doxycycline (VIBRA-TABS) 100 MG tablet   predniSONE (DELTASONE) 20 MG tablet       Follow up plan: Return if symptoms worsen or fail to improve.

## 2019-05-09 ENCOUNTER — Other Ambulatory Visit: Payer: Self-pay | Admitting: Family Medicine

## 2019-05-28 ENCOUNTER — Encounter: Payer: Self-pay | Admitting: Family Medicine

## 2019-05-28 ENCOUNTER — Ambulatory Visit (INDEPENDENT_AMBULATORY_CARE_PROVIDER_SITE_OTHER): Payer: BLUE CROSS/BLUE SHIELD | Admitting: Family Medicine

## 2019-05-28 ENCOUNTER — Other Ambulatory Visit: Payer: Self-pay

## 2019-05-28 VITALS — Wt 156.0 lb

## 2019-05-28 DIAGNOSIS — J4521 Mild intermittent asthma with (acute) exacerbation: Secondary | ICD-10-CM

## 2019-05-28 DIAGNOSIS — E782 Mixed hyperlipidemia: Secondary | ICD-10-CM | POA: Diagnosis not present

## 2019-05-28 DIAGNOSIS — F410 Panic disorder [episodic paroxysmal anxiety] without agoraphobia: Secondary | ICD-10-CM

## 2019-05-28 MED ORDER — CLONAZEPAM 0.5 MG PO TABS: 0.5000 mg | ORAL_TABLET | Freq: Two times a day (BID) | ORAL | 1 refills | Status: DC | PRN

## 2019-05-28 MED ORDER — BUDESONIDE-FORMOTEROL FUMARATE 160-4.5 MCG/ACT IN AERO: INHALATION_SPRAY | RESPIRATORY_TRACT | 1 refills | Status: DC

## 2019-05-28 MED ORDER — ATORVASTATIN CALCIUM 40 MG PO TABS: 40.0000 mg | ORAL_TABLET | Freq: Every day | ORAL | 1 refills | Status: DC

## 2019-05-28 MED ORDER — ALBUTEROL SULFATE (2.5 MG/3ML) 0.083% IN NEBU: 2.5000 mg | INHALATION_SOLUTION | Freq: Four times a day (QID) | RESPIRATORY_TRACT | 1 refills | Status: DC | PRN

## 2019-05-28 NOTE — Progress Notes (Signed)
Wt 156 lb (70.8 kg)   SpO2 99%   BMI 23.31 kg/m    Subjective:    Patient ID: YOCHANAN DEMPEWOLF, male    DOB: 16-Nov-1970, 48 y.o.   MRN: [id number]  HPI: TYMEER TURKOVICH is a 48 y.o. male  Chief Complaint  Patient presents with  . Anxiety  . Medication Refill    Needs a prescription for generic Proair  . Hyperlipidemia   HYPERLIPIDEMIA Hyperlipidemia status: excellent compliance Satisfied with current treatment?  yes Side effects:  no Medication compliance: excellent compliance Past cholesterol meds: atorvastatin Supplements: none Aspirin:  no The 10-year ASCVD risk score [name], et al., 2013) is: 5.6%   Values used to calculate the score:     Age: 30 years     Sex: Male     Is Non-Hispanic African American: No     Diabetic: No     Tobacco smoker: Yes     Systolic Blood Pressure: 0000000 mmHg     Is BP treated: No     HDL Cholesterol: 56 mg/dL     Total Cholesterol: 187 mg/dL Chest pain:  no Coronary artery disease:  no  ANXIETY/STRESS- has been really worried with work, needing to take his klonopin about 1-3x a week Duration:stable Anxious mood: yes  Excessive worrying: no Irritability: no  Sweating: no Nausea: no Palpitations:no Hyperventilation: no Panic attacks: no Agoraphobia: no  Obscessions/compulsions: no Depressed mood: no Depression screen [facility] 2/9 05/28/2019 12/17/2018 07/18/2018 10/15/2017 06/06/2017  Decreased Interest 0 0 1 1 1   Down, Depressed, Hopeless 0 0 1 1 0  PHQ - 2 Score 0 0 2 2 1   Altered sleeping 2 1 1 1 2   Tired, decreased energy 1 1 1 1 2   Change in appetite 0 0 - 0 0  Feeling bad or failure about yourself  0 0 0 1 0  Trouble concentrating 0 0 0 0 0  Moving slowly or fidgety/restless 0 0 0 - 0  Suicidal thoughts 0 0 0 0 0  PHQ-9 Score 3 2 4 5 5   Difficult doing work/chores Not difficult at all Not difficult at all - - -  Some recent data might be hidden   Anhedonia: no Weight changes: no Insomnia: no   Hypersomnia: no  Fatigue/loss of energy: no Feelings of worthlessness: no Feelings of guilt: no Impaired concentration/indecisiveness: no Suicidal ideations: no  Crying spells: no Recent Stressors/Life Changes: yes   Relationship problems: no   Family stress: no     Financial stress: no    Job stress: no    Recent death/loss: no   Relevant past medical, surgical, family and social history reviewed and updated as indicated. Interim medical history since our last visit reviewed. Allergies and medications reviewed and updated.  Review of Systems  Constitutional: Negative for activity change, appetite change, chills, diaphoresis, fatigue, fever and unexpected weight change.  HENT: Negative.   Respiratory: Negative.   Cardiovascular: Negative.   Musculoskeletal: Negative.   Skin: Negative.   Psychiatric/Behavioral: Negative for agitation, behavioral problems, confusion, decreased concentration, dysphoric mood, hallucinations, self-injury, sleep disturbance and suicidal ideas. The patient is nervous/anxious. The patient is not hyperactive.     Per HPI unless specifically indicated above     Objective:    Wt 156 lb (70.8 kg)   SpO2 99%   BMI 23.31 kg/m   Wt Readings from Last 3 Encounters:  05/28/19 156 lb (70.8 kg)  12/17/18 163 lb (73.9 kg)  07/18/18  168 lb 8 oz (76.4 kg)    Physical Exam Vitals signs and nursing note reviewed.  Constitutional:      General: He is not in acute distress.    Appearance: Normal appearance. He is not ill-appearing, toxic-appearing or diaphoretic.  HENT:     Head: Normocephalic and atraumatic.     Right Ear: External ear normal.     Left Ear: External ear normal.     Nose: Nose normal.     Mouth/Throat:     Mouth: Mucous membranes are moist.     Pharynx: Oropharynx is clear.  Eyes:     General: No scleral icterus.       Right eye: No discharge.        Left eye: No discharge.     Conjunctiva/sclera: Conjunctivae normal.     Pupils: Pupils are equal,  round, and reactive to light.  Neck:     Musculoskeletal: Normal range of motion.  Pulmonary:     Effort: Pulmonary effort is normal. No respiratory distress.     Comments: Speaking in full sentences Musculoskeletal: Normal range of motion.  Skin:    Coloration: Skin is not jaundiced or pale.     Findings: No bruising, erythema, lesion or rash.  Neurological:     Mental Status: He is alert and oriented to person, place, and time. Mental status is at baseline.  Psychiatric:        Mood and Affect: Mood normal.        Behavior: Behavior normal.        Thought Content: Thought content normal.        Judgment: Judgment normal.     Results for orders placed or performed in visit on 02/07/18  Comprehensive metabolic panel  Result Value Ref Range   Glucose 89 65 - 99 mg/dL   BUN 13 6 - 24 mg/dL   Creatinine, Ser 1.00 0.76 - 1.27 mg/dL   GFR calc non Af Amer 90 >59 mL/min/1.73   GFR calc Af Amer 104 >59 mL/min/1.73   BUN/Creatinine Ratio 13 9 - 20   Sodium 139 134 - 144 mmol/L   Potassium 4.1 3.5 - 5.2 mmol/L   Chloride 98 96 - 106 mmol/L   CO2 25 20 - 29 mmol/L   Calcium 9.3 8.7 - 10.2 mg/dL   Total Protein 7.2 6.0 - 8.5 g/dL   Albumin 4.5 3.5 - 5.5 g/dL   Globulin, Total 2.7 1.5 - 4.5 g/dL   Albumin/Globulin Ratio 1.7 1.2 - 2.2   Bilirubin Total 0.4 0.0 - 1.2 mg/dL   Alkaline Phosphatase 85 39 - 117 IU/L   AST 30 0 - 40 IU/L   ALT 57 (H) 0 - 44 IU/L  Lipid Panel w/o Chol/HDL Ratio  Result Value Ref Range   Cholesterol, Total 187 100 - 199 mg/dL   Triglycerides 519 (H) 0 - 149 mg/dL   HDL 56 >39 mg/dL   VLDL Cholesterol Cal Comment 5 - 40 mg/dL   LDL Calculated Comment 0 - 99 mg/dL  CBC with Differential/Platelet  Result Value Ref Range   WBC 8.3 3.4 - 10.8 x10E3/uL   RBC 4.64 4.14 - 5.80 x10E6/uL   Hemoglobin 15.8 13.0 - 17.7 g/dL   Hematocrit 45.2 37.5 - 51.0 %   MCV 97 79 - 97 fL   MCH 34.1 (H) 26.6 - 33.0 pg   MCHC 35.0 31.5 - 35.7 g/dL   RDW 14.2 12.3 - 15.4 %    Platelets  285 150 - 450 x10E3/uL   Neutrophils 47 Not Estab. %   Lymphs 35 Not Estab. %   Monocytes 10 Not Estab. %   Eos 3 Not Estab. %   Basos 1 Not Estab. %   Neutrophils Absolute 3.8 1.4 - 7.0 x10E3/uL   Lymphocytes Absolute 2.9 0.7 - 3.1 x10E3/uL   Monocytes Absolute 0.8 0.1 - 0.9 x10E3/uL   EOS (ABSOLUTE) 0.3 0.0 - 0.4 x10E3/uL   Basophils Absolute 0.1 0.0 - 0.2 x10E3/uL   Immature Granulocytes 4 Not Estab. %   Immature Grans (Abs) 0.3 (H) 0.0 - 0.1 x10E3/uL      Assessment & Plan:   Problem List Items Addressed This Visit      Respiratory   Asthma    Under good control on current regimen. Continue current regimen. Continue to monitor. Call with any concerns. Refills given.        Relevant Medications   albuterol (PROVENTIL) (2.5 MG/3ML) 0.083% nebulizer solution   budesonide-formoterol (SYMBICORT) 160-4.5 MCG/ACT inhaler     Other   Panic disorder    Under good control on current regimen. Continue current regimen. Continue to monitor. Call with any concerns. Refills given- should last about 6 months.        Hyperlipidemia - Primary    Under good control on current regimen. Continue current regimen. Continue to monitor. Call with any concerns. Refills given. Labs to be drawn ASAP.       Relevant Medications   atorvastatin (LIPITOR) 40 MG tablet   Other Relevant Orders   Comprehensive metabolic panel   Lipid Panel w/o Chol/HDL Ratio       Follow up plan: Return in about 6 months (around 11/26/2019) for Physical.   . This visit was completed via FaceTime due to the restrictions of the COVID-19 pandemic. All issues as above were discussed and addressed. Physical exam was done as above through visual confirmation on FaceTime. If it was felt that the patient should be evaluated in the office, they were directed there. The patient verbally consented to this visit. . Location of the patient: home . Location of the provider: home . Those involved with this call:   . Provider: Park Liter, DO . CMA: Tiffany Reel, CMA . Front Desk/Registration: Don Perking  . Time spent on call: 25 minutes with patient face to face via video conference. More than 50% of this time was spent in counseling and coordination of care. 40 minutes total spent in review of patient's record and preparation of their chart.

## 2019-05-28 NOTE — Assessment & Plan Note (Signed)
Under good control on current regimen. Continue current regimen. Continue to monitor. Call with any concerns. Refills given- should last about 6 months.

## 2019-05-28 NOTE — Assessment & Plan Note (Signed)
Under good control on current regimen. Continue current regimen. Continue to monitor. Call with any concerns. Refills given. Labs to be drawn ASAP.   

## 2019-05-28 NOTE — Assessment & Plan Note (Signed)
Under good control on current regimen. Continue current regimen. Continue to monitor. Call with any concerns. Refills given.   

## 2019-09-16 ENCOUNTER — Encounter: Payer: Self-pay | Admitting: Family Medicine

## 2019-09-17 ENCOUNTER — Encounter: Payer: Self-pay | Admitting: Nurse Practitioner

## 2019-09-17 ENCOUNTER — Ambulatory Visit (INDEPENDENT_AMBULATORY_CARE_PROVIDER_SITE_OTHER): Payer: 59 | Admitting: Family Medicine

## 2019-09-17 ENCOUNTER — Telehealth (INDEPENDENT_AMBULATORY_CARE_PROVIDER_SITE_OTHER): Payer: 59 | Admitting: Family Medicine

## 2019-09-17 ENCOUNTER — Other Ambulatory Visit: Payer: Self-pay

## 2019-09-17 VITALS — Ht 68.0 in | Wt 155.0 lb

## 2019-09-17 DIAGNOSIS — Z9189 Other specified personal risk factors, not elsewhere classified: Secondary | ICD-10-CM

## 2019-09-17 DIAGNOSIS — F410 Panic disorder [episodic paroxysmal anxiety] without agoraphobia: Secondary | ICD-10-CM

## 2019-09-17 DIAGNOSIS — J4521 Mild intermittent asthma with (acute) exacerbation: Secondary | ICD-10-CM | POA: Diagnosis not present

## 2019-09-17 MED ORDER — CLONAZEPAM 0.5 MG PO TABS: 0.5000 mg | ORAL_TABLET | Freq: Two times a day (BID) | ORAL | 1 refills | Status: DC | PRN

## 2019-09-17 MED ORDER — BUDESONIDE-FORMOTEROL FUMARATE 160-4.5 MCG/ACT IN AERO: INHALATION_SPRAY | RESPIRATORY_TRACT | 1 refills | Status: DC

## 2019-09-17 NOTE — Progress Notes (Signed)
There were no vitals taken for this visit.   Subjective:    Patient ID: Timothy Finley, male    DOB: [birth year], 49 y.o.   MRN: [id number]  HPI: Timothy Finley is a 49 y.o. male  No chief complaint on file.   . This visit was completed via telephone due to the restrictions of the COVID-19 pandemic. All issues as above were discussed and addressed. Physical exam was done as above through visual confirmation on telephone. If it was felt that the patient should be evaluated in the office, they were directed there. The patient verbally consented to this visit. . Location of the patient: home . Location of the provider: home . Those involved with this call:  . Provider: Merrie Roof, PA-C . CMA: Lesle Chris, [place] . Front Desk/Registration: Jill Side  . Time spent on call: 25 minutes on the phone discussing health concerns. 5 minutes total spent in review of patient's record and preparation of their chart. I verified patient identity using two factors (patient name and date of birth). Patient consents verbally to being seen via telemedicine visit today.   Pt requesting FMLA paperwork due to high risk status with his asthma during COVID. 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 Requesting Monday start date and 8 week duration. Hoping to get COVID vaccine in the next several months.   Also needing refills on several medications Needing symbicort for asthma control. States this has been doing a good job for him and he's had no recent exacerbations. Klonopin as needed controlling panic episodes well. Not having any side effects from it.   Relevant past medical, surgical, family and social history reviewed and updated as indicated. Interim medical history since our last visit reviewed. Allergies and medications reviewed and updated.  Review of  Systems  Per HPI unless specifically indicated above     Objective:    There were no vitals taken for this visit.  Wt Readings from Last 3 Encounters:  09/17/19 155 lb (70.3 kg)  05/28/19 156 lb (70.8 kg)  12/17/18 163 lb (73.9 kg)    Physical Exam  Results for orders placed or performed in visit on 02/07/18  Comprehensive metabolic panel  Result Value Ref Range   Glucose 89 65 - 99 mg/dL   BUN 13 6 - 24 mg/dL   Creatinine, Ser 1.00 0.76 - 1.27 mg/dL   GFR calc non Af Amer 90 >59 mL/min/1.73   GFR calc Af Amer 104 >59 mL/min/1.73   BUN/Creatinine Ratio 13 9 - 20   Sodium 139 134 - 144 mmol/L   Potassium 4.1 3.5 - 5.2 mmol/L   Chloride 98 96 - 106 mmol/L   CO2 25 20 - 29 mmol/L   Calcium 9.3 8.7 - 10.2 mg/dL   Total Protein 7.2 6.0 - 8.5 g/dL   Albumin 4.5 3.5 - 5.5 g/dL   Globulin, Total 2.7 1.5 - 4.5 g/dL   Albumin/Globulin Ratio 1.7 1.2 - 2.2   Bilirubin Total 0.4 0.0 - 1.2 mg/dL   Alkaline Phosphatase 85 39 - 117 IU/L   AST 30 0 - 40 IU/L   ALT 57 (H) 0 - 44 IU/L  Lipid Panel w/o Chol/HDL Ratio  Result Value Ref Range   Cholesterol, Total 187 100 - 199 mg/dL   Triglycerides 519 (H) 0 -  149 mg/dL   HDL 56 >39 mg/dL   VLDL Cholesterol Cal Comment 5 - 40 mg/dL   LDL Calculated Comment 0 - 99 mg/dL  CBC with Differential/Platelet  Result Value Ref Range   WBC 8.3 3.4 - 10.8 x10E3/uL   RBC 4.64 4.14 - 5.80 x10E6/uL   Hemoglobin 15.8 13.0 - 17.7 g/dL   Hematocrit 45.2 37.5 - 51.0 %   MCV 97 79 - 97 fL   MCH 34.1 (H) 26.6 - 33.0 pg   MCHC 35.0 31.5 - 35.7 g/dL   RDW 14.2 12.3 - 15.4 %   Platelets 285 150 - 450 x10E3/uL   Neutrophils 47 Not Estab. %   Lymphs 35 Not Estab. %   Monocytes 10 Not Estab. %   Eos 3 Not Estab. %   Basos 1 Not Estab. %   Neutrophils Absolute 3.8 1.4 - 7.0 x10E3/uL   Lymphocytes Absolute 2.9 0.7 - 3.1 x10E3/uL   Monocytes Absolute 0.8 0.1 - 0.9 x10E3/uL   EOS (ABSOLUTE) 0.3 0.0 - 0.4 x10E3/uL   Basophils Absolute 0.1 0.0 - 0.2  x10E3/uL   Immature Granulocytes 4 Not Estab. %   Immature Grans (Abs) 0.3 (H) 0.0 - 0.1 x10E3/uL      Assessment & Plan:   Problem List Items Addressed This Visit      Respiratory   Asthma - Primary    Will refill symbicort, continue current regimen. Stable and under excellent control      Relevant Medications   budesonide-formoterol (SYMBICORT) 160-4.5 MCG/ACT inhaler     Other   Panic disorder    Stable and well controlled, continue current regimen with sparing prn use of klonopin       Other Visit Diagnoses    At increased risk of exposure to COVID-19 virus       Will complete FMLA forms stating wife would benefit from working from home due to his risk factors for COVID until vaccine can be completed       Follow up plan: Return if symptoms worsen or fail to improve.

## 2019-09-17 NOTE — Progress Notes (Signed)
There were no vitals taken for this visit.   Subjective:    Patient ID: Timothy Finley, male    DOB: 07/03/1971, 49 y.o.   MRN: [id number]  HPI: Timothy Finley is a 49 y.o. male  No chief complaint on file.  Visit was cancelled with me due to technology issues.  No Known Allergies  Outpatient Encounter Medications as of 09/17/2019  Medication Sig  . albuterol (PROVENTIL) (2.5 MG/3ML) 0.083% nebulizer solution Take 3 mLs (2.5 mg total) by nebulization every 6 (six) hours as needed for wheezing or shortness of breath.  Marland Kitchen aspirin EC 81 MG tablet Take 81 mg by mouth daily.  Marland Kitchen atorvastatin (LIPITOR) 40 MG tablet Take 1 tablet (40 mg total) by mouth daily.  . budesonide-formoterol (SYMBICORT) 160-4.5 MCG/ACT inhaler TAKE 2 PUFFS BY MOUTH TWICE A DAY  . cetirizine (ZYRTEC ALLERGY) 10 MG tablet Take 10 mg by mouth daily.  . clonazePAM (KLONOPIN) 0.5 MG tablet Take 1 tablet (0.5 mg total) by mouth 2 (two) times daily as needed for anxiety.   No facility-administered encounter medications on file as of 09/17/2019.   Patient Active Problem List   Diagnosis Date Noted  . Hyperlipidemia 12/26/2016  . Panic disorder 03/23/2016  . Asthma 09/29/2010  . CIGARETTE SMOKER 06/26/2010  . Allergic rhinitis 06/26/2010   Past Medical History:  Diagnosis Date  . Anxiety   . Kidney stones    Review of Systems  Per HPI unless specifically indicated above     Objective:    There were no vitals taken for this visit.  Wt Readings from Last 3 Encounters:  05/28/19 156 lb (70.8 kg)  12/17/18 163 lb (73.9 kg)  07/18/18 168 lb 8 oz (76.4 kg)    Physical Exam  Results for orders placed or performed in visit on 02/07/18  Comprehensive metabolic panel  Result Value Ref Range   Glucose 89 65 - 99 mg/dL   BUN 13 6 - 24 mg/dL   Creatinine, Ser 1.00 0.76 - 1.27 mg/dL   GFR calc non Af Amer 90 >59 mL/min/1.73   GFR calc Af Amer 104 >59 mL/min/1.73   BUN/Creatinine Ratio 13 9 - 20   Sodium 139  134 - 144 mmol/L   Potassium 4.1 3.5 - 5.2 mmol/L   Chloride 98 96 - 106 mmol/L   CO2 25 20 - 29 mmol/L   Calcium 9.3 8.7 - 10.2 mg/dL   Total Protein 7.2 6.0 - 8.5 g/dL   Albumin 4.5 3.5 - 5.5 g/dL   Globulin, Total 2.7 1.5 - 4.5 g/dL   Albumin/Globulin Ratio 1.7 1.2 - 2.2   Bilirubin Total 0.4 0.0 - 1.2 mg/dL   Alkaline Phosphatase 85 39 - 117 IU/L   AST 30 0 - 40 IU/L   ALT 57 (H) 0 - 44 IU/L  Lipid Panel w/o Chol/HDL Ratio  Result Value Ref Range   Cholesterol, Total 187 100 - 199 mg/dL   Triglycerides 519 (H) 0 - 149 mg/dL   HDL 56 >39 mg/dL   VLDL Cholesterol Cal Comment 5 - 40 mg/dL   LDL Calculated Comment 0 - 99 mg/dL  CBC with Differential/Platelet  Result Value Ref Range   WBC 8.3 3.4 - 10.8 x10E3/uL   RBC 4.64 4.14 - 5.80 x10E6/uL   Hemoglobin 15.8 13.0 - 17.7 g/dL   Hematocrit 45.2 37.5 - 51.0 %   MCV 97 79 - 97 fL   MCH 34.1 (H) 26.6 - 33.0 pg  MCHC 35.0 31.5 - 35.7 g/dL   RDW 14.2 12.3 - 15.4 %   Platelets 285 150 - 450 x10E3/uL   Neutrophils 47 Not Estab. %   Lymphs 35 Not Estab. %   Monocytes 10 Not Estab. %   Eos 3 Not Estab. %   Basos 1 Not Estab. %   Neutrophils Absolute 3.8 1.4 - 7.0 x10E3/uL   Lymphocytes Absolute 2.9 0.7 - 3.1 x10E3/uL   Monocytes Absolute 0.8 0.1 - 0.9 x10E3/uL   EOS (ABSOLUTE) 0.3 0.0 - 0.4 x10E3/uL   Basophils Absolute 0.1 0.0 - 0.2 x10E3/uL   Immature Granulocytes 4 Not Estab. %   Immature Grans (Abs) 0.3 (H) 0.0 - 0.1 x10E3/uL      Assessment & Plan:   Problem List Items Addressed This Visit    None       Follow up plan: No follow-ups on file.

## 2019-09-17 NOTE — Assessment & Plan Note (Signed)
Will refill symbicort, continue current regimen. Stable and under excellent control

## 2019-09-17 NOTE — Assessment & Plan Note (Signed)
Stable and well controlled, continue current regimen with sparing prn use of klonopin

## 2019-09-19 ENCOUNTER — Other Ambulatory Visit: Payer: Self-pay | Admitting: Family Medicine

## 2019-09-22 ENCOUNTER — Encounter: Payer: Self-pay | Admitting: Family Medicine

## 2019-09-24 ENCOUNTER — Encounter: Payer: Self-pay | Admitting: Family Medicine

## 2019-09-24 NOTE — Telephone Encounter (Signed)
Patient came and picked up medication.  

## 2019-09-29 ENCOUNTER — Encounter: Payer: Self-pay | Admitting: Family Medicine

## 2019-09-30 ENCOUNTER — Telehealth: Payer: Self-pay | Admitting: Family Medicine

## 2019-09-30 NOTE — Telephone Encounter (Signed)
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 They cited incomplete answers on FMLA Part B, Questions 5-7. These were marked NO but without this section marked yes, the FMLA leave of absence will remain denied and will not allow her to be considered for remote work status. The questions are listed below: 5. Will the patient require follow up treatments, including any time for recovery? 6. Will the patient require care on an intermittent or reduced schedule basis, including any time for recovery? 7. Will the condition cause episodic flare-ups periodically preventing the patient from participating in normal activities?  I will upload the original form and the Part B that needs to be completed/amended. Could you complete these and have them faxed to [facility], [place], [phone]? Let me know if I should contact Merrie Roof instead. Thank you very much. We want my wife to be able to limit her exposure as soon as possible.   **Received above message via mychart, called pt and left VM to return call to discuss. Do not feel comfortable checking yes to any of these as the answer is not a yes. Will discuss further when he returns call. Will CC his PCP as FYI to the situation

## 2019-10-02 NOTE — Telephone Encounter (Signed)
Agree with Timothy Finley's assessment below. I would not do anything differently.

## 2019-10-04 NOTE — Progress Notes (Signed)
Appt cancelled and rescheduled onto my schedule due to power outage affecting wifi for Timothy Finley's visit with patient

## 2019-10-15 ENCOUNTER — Ambulatory Visit: Payer: Self-pay | Attending: Internal Medicine

## 2019-10-15 DIAGNOSIS — Z23 Encounter for immunization: Secondary | ICD-10-CM

## 2019-10-15 NOTE — Progress Notes (Signed)
   Covid-19 Vaccination Clinic  Name:  Timothy Finley    MRN: [id number] DOB: [birth year]  10/15/2019  [name] was observed post Covid-19 immunization for 15 minutes without incident. He was provided with Vaccine Information Sheet and instruction to access the V-Safe system.   [name] was instructed to call 911 with any severe reactions post vaccine: Marland Kitchen Difficulty breathing  . Swelling of face and throat  . A fast heartbeat  . A bad rash all over body  . Dizziness and weakness   Immunizations Administered    Name Date Dose VIS Date Route   Pfizer COVID-19 Vaccine 10/15/2019  3:05 PM 0.3 mL 07/10/2019 Intramuscular   Manufacturer: [place]   Lot: [id number]   [place]: [id number]

## 2019-10-27 ENCOUNTER — Other Ambulatory Visit: Payer: Self-pay | Admitting: Family Medicine

## 2019-10-27 NOTE — Telephone Encounter (Signed)
Requested medications are due for refill today?  Patient has albuterol nebs on active medication list.  No albuterol inhaler.  This request is for Albuterol inhaler.    Requested medications are on active medication list?  Albuterol nebs listed but not albuterol inhaler.    Last Refill:    Future visit scheduled?  No  Notes to Clinic:   Please review.

## 2019-10-27 NOTE — Telephone Encounter (Signed)
Patient last seen 09/17/19, Timothy Finley is listed in historical med list.

## 2019-11-11 ENCOUNTER — Ambulatory Visit: Payer: Self-pay | Attending: Internal Medicine

## 2019-11-11 DIAGNOSIS — Z23 Encounter for immunization: Secondary | ICD-10-CM

## 2019-11-11 NOTE — Progress Notes (Signed)
   Covid-19 Vaccination Clinic  Name:  Timothy Finley    MRN: [id number] DOB: 1971-02-07  11/11/2019  Mr. Timothy Finley was observed post Covid-19 immunization for 15 minutes without incident. He was provided with Vaccine Information Sheet and instruction to access the V-Safe system.   Mr. Timothy Finley was instructed to call 911 with any severe reactions post vaccine: Marland Kitchen Difficulty breathing  . Swelling of face and throat  . A fast heartbeat  . A bad rash all over body  . Dizziness and weakness   Immunizations Administered    Name Date Dose VIS Date Route   Pfizer COVID-19 Vaccine 11/11/2019  2:19 PM 0.3 mL 07/10/2019 Intramuscular   Manufacturer: [place]   Lot: [id number]   [place]: [id number]

## 2019-11-17 ENCOUNTER — Other Ambulatory Visit: Payer: Self-pay | Admitting: Family Medicine

## 2019-11-17 NOTE — Telephone Encounter (Signed)
Requested Prescriptions  Pending Prescriptions Disp Refills  . albuterol (VENTOLIN HFA) 108 (90 Base) MCG/ACT inhaler [Pharmacy Med Name: ALBUTEROL HFA (PROAIR) INHALER] 18 g 3    Sig: TAKE 2 PUFFS BY MOUTH EVERY 6 HOURS AS NEEDED FOR WHEEZE     Pulmonology:  Beta Agonists Failed - 11/17/2019  2:09 AM      Failed - One inhaler should last at least one month. If the patient is requesting refills earlier, contact the patient to check for uncontrolled symptoms.      Passed - Valid encounter within last 12 months    Recent Outpatient Visits          2 months ago Mild intermittent asthma with acute exacerbation   [place], [place]   2 months ago Erroneous encounter - disregard   [facility] Merrie Roof [place], [place]   5 months ago Mixed hyperlipidemia   [facility] [place], Megan P, DO   10 months ago Acute maxillary sinusitis, recurrence not specified   [place], [place], [place]   11 months ago Panic disorder   [place], [place], DO

## 2020-01-22 ENCOUNTER — Encounter: Payer: Self-pay | Admitting: Family Medicine

## 2020-01-28 MED ORDER — BUDESONIDE-FORMOTEROL FUMARATE 160-4.5 MCG/ACT IN AERO: INHALATION_SPRAY | RESPIRATORY_TRACT | 1 refills | Status: DC

## 2020-01-28 MED ORDER — ALBUTEROL SULFATE (2.5 MG/3ML) 0.083% IN NEBU: 2.5000 mg | INHALATION_SOLUTION | Freq: Four times a day (QID) | RESPIRATORY_TRACT | 1 refills | Status: DC | PRN

## 2020-01-28 MED ORDER — ALBUTEROL SULFATE HFA 108 (90 BASE) MCG/ACT IN AERS: INHALATION_SPRAY | RESPIRATORY_TRACT | 3 refills | Status: DC

## 2020-02-02 MED ORDER — FLUTICASONE-SALMETEROL 250-50 MCG/DOSE IN AEPB: 1.0000 | INHALATION_SPRAY | Freq: Two times a day (BID) | RESPIRATORY_TRACT | 3 refills | Status: DC

## 2020-02-02 NOTE — Addendum Note (Signed)
Addended by: Valerie Roys on: 02/02/2020 11:11 AM   Modules accepted: Orders

## 2020-02-09 ENCOUNTER — Other Ambulatory Visit: Payer: Self-pay | Admitting: Family Medicine

## 2020-02-09 NOTE — Telephone Encounter (Signed)
Requested medications are due for refill today? Yes  Requested medications are on active medication list?  Yes  Last Refill:   05/28/2019  # 90 with no refills  Future visit scheduled?  No   Notes to Clinic:  Medication failed RX refill protocol due to no labs within 360 days. The last labs performed were on 02/07/2018.

## 2020-02-17 ENCOUNTER — Encounter: Payer: Self-pay | Admitting: Family Medicine

## 2020-02-17 ENCOUNTER — Other Ambulatory Visit: Payer: Self-pay

## 2020-02-17 ENCOUNTER — Ambulatory Visit (INDEPENDENT_AMBULATORY_CARE_PROVIDER_SITE_OTHER): Payer: 59 | Admitting: Family Medicine

## 2020-02-17 VITALS — BP 148/89 | HR 89 | Temp 98.7°F | Wt 154.6 lb

## 2020-02-17 DIAGNOSIS — Z79899 Other long term (current) drug therapy: Secondary | ICD-10-CM

## 2020-02-17 DIAGNOSIS — J4521 Mild intermittent asthma with (acute) exacerbation: Secondary | ICD-10-CM

## 2020-02-17 DIAGNOSIS — Z114 Encounter for screening for human immunodeficiency virus [HIV]: Secondary | ICD-10-CM

## 2020-02-17 DIAGNOSIS — Z1159 Encounter for screening for other viral diseases: Secondary | ICD-10-CM

## 2020-02-17 DIAGNOSIS — F17200 Nicotine dependence, unspecified, uncomplicated: Secondary | ICD-10-CM

## 2020-02-17 DIAGNOSIS — E782 Mixed hyperlipidemia: Secondary | ICD-10-CM | POA: Diagnosis not present

## 2020-02-17 DIAGNOSIS — F410 Panic disorder [episodic paroxysmal anxiety] without agoraphobia: Secondary | ICD-10-CM

## 2020-02-17 DIAGNOSIS — Z1211 Encounter for screening for malignant neoplasm of colon: Secondary | ICD-10-CM

## 2020-02-17 MED ORDER — ALBUTEROL SULFATE HFA 108 (90 BASE) MCG/ACT IN AERS: INHALATION_SPRAY | RESPIRATORY_TRACT | 3 refills | Status: DC

## 2020-02-17 MED ORDER — FLUTICASONE-SALMETEROL 250-50 MCG/DOSE IN AEPB: 1.0000 | INHALATION_SPRAY | Freq: Two times a day (BID) | RESPIRATORY_TRACT | 1 refills | Status: DC

## 2020-02-17 MED ORDER — ATORVASTATIN CALCIUM 40 MG PO TABS: 40.0000 mg | ORAL_TABLET | Freq: Every day | ORAL | 1 refills | Status: DC

## 2020-02-17 MED ORDER — BUPROPION HCL ER (SR) 150 MG PO TB12
ORAL_TABLET | ORAL | 3 refills | Status: DC
Start: 2020-02-17 — End: 2021-03-31

## 2020-02-17 MED ORDER — ALBUTEROL SULFATE (2.5 MG/3ML) 0.083% IN NEBU: 2.5000 mg | INHALATION_SOLUTION | Freq: Four times a day (QID) | RESPIRATORY_TRACT | 1 refills | Status: DC | PRN

## 2020-02-17 MED ORDER — NICOTINE 21 MG/24HR TD PT24: 21.0000 mg | MEDICATED_PATCH | Freq: Every day | TRANSDERMAL | 2 refills | Status: DC

## 2020-02-17 MED ORDER — CLONAZEPAM 0.5 MG PO TABS: 0.5000 mg | ORAL_TABLET | Freq: Two times a day (BID) | ORAL | 1 refills | Status: DC | PRN

## 2020-02-17 NOTE — Assessment & Plan Note (Signed)
Under good control on current regimen. Continue current regimen. Continue to monitor. Call with any concerns. Refills given. Refills should last about 6 months.

## 2020-02-17 NOTE — Progress Notes (Signed)
BP (!) 148/89 (BP Location: Left Arm, Patient Position: Sitting, Cuff Size: Normal)   Pulse 89   Temp 98.7 F (37.1 C) (Oral)   Wt 154 lb 9.6 oz (70.1 kg)   SpO2 100%   BMI 23.51 kg/m    Subjective:    Patient ID: Timothy Finley, male    DOB: [birth year], 49 y.o.   MRN: [id number]  HPI: Timothy Finley is a 49 y.o. male  Chief Complaint  Patient presents with  . Hyperlipidemia  . Anxiety   ANXIETY/STRESS Duration: Chronic Status:exacerbated Anxious mood: yes  Excessive worrying: yes Irritability: yes  Sweating: no Nausea: no Palpitations:no Hyperventilation: no Panic attacks: no Agoraphobia: no  Obscessions/compulsions: no Depressed mood: yes Depression screen [facility] 2/9 02/17/2020 09/17/2019 05/28/2019 12/17/2018 07/18/2018  Decreased Interest 0 0 0 0 1  Down, Depressed, Hopeless 0 0 0 0 1  PHQ - 2 Score 0 0 0 0 2  Altered sleeping 1 1 2 1 1   Tired, decreased energy 0 0 1 1 1   Change in appetite 0 0 0 0 -  Feeling bad or failure about yourself  0 0 0 0 0  Trouble concentrating 0 0 0 0 0  Moving slowly or fidgety/restless 0 0 0 0 0  Suicidal thoughts 0 0 0 0 0  PHQ-9 Score 1 1 3 2 4   Difficult doing work/chores Not difficult at all Not difficult at all Not difficult at all Not difficult at all -  Some recent data might be hidden   Anhedonia: no Weight changes: no Insomnia: no   Hypersomnia: no Fatigue/loss of energy: yes Feelings of worthlessness: no Feelings of guilt: yes Impaired concentration/indecisiveness: no Suicidal ideations: no  Crying spells: no Recent Stressors/Life Changes: yes   Relationship problems: no   Family stress: yes     Financial stress: yes    Job stress: yes    Recent death/loss: no  HYPERLIPIDEMIA Hyperlipidemia status: stable Satisfied with current treatment?  yes Side effects:  no Medication compliance: excellent compliance Past cholesterol meds: atorvastatin Supplements: none Aspirin:  no The 10-year ASCVD risk score  [name], et al., 2013) is: 6.9%   Values used to calculate the score:     Age: 22 years     Sex: Male     Is Non-Hispanic African American: No     Diabetic: No     Tobacco smoker: Yes     Systolic Blood Pressure: 409 mmHg     Is BP treated: No     HDL Cholesterol: 56 mg/dL     Total Cholesterol: 187 mg/dL Chest pain:  no  ASTHMA Asthma status: stable Satisfied with current treatment?: yes Albuterol/rescue inhaler frequency: 2-3x a day Dyspnea frequency: 2-3 x a day Wheezing frequency: occasionally Cough frequency: in the AM Nocturnal symptom frequency:  Limitation of activity: yes Current upper respiratory symptoms: no Aerochamber/spacer use: no Visits to ER or Urgent Care in past year: no Pneumovax: Given today Influenza: Postpone to flu season  SMOKING CESSATION Smoking Status: every day smoker Smoking Amount: 1+ppd Smoking Onset: 17-18yo Smoking Quit Date: not set Smoking triggers: stress, timing, eating Type of tobacco use: cigarettes Other household members who smoke: no Treatments attempted: chantix, patches, nicotine pouches Pneumovax: given today  Relevant past medical, surgical, family and social history reviewed and updated as indicated. Interim medical history since our last visit reviewed. Allergies and medications reviewed and updated.  Review of Systems  Constitutional: Negative.   HENT: Negative.  Respiratory: Positive for cough, shortness of breath and wheezing. Negative for apnea, choking, chest tightness and stridor.   Cardiovascular: Negative.   Gastrointestinal: Negative.   Musculoskeletal: Negative.   Skin: Negative.   Neurological: Negative.   Psychiatric/Behavioral: Negative for agitation, behavioral problems, confusion, decreased concentration, dysphoric mood, hallucinations, self-injury, sleep disturbance and suicidal ideas. The patient is nervous/anxious. The patient is not hyperactive.     Per HPI unless specifically indicated  above     Objective:    BP (!) 148/89 (BP Location: Left Arm, Patient Position: Sitting, Cuff Size: Normal)   Pulse 89   Temp 98.7 F (37.1 C) (Oral)   Wt 154 lb 9.6 oz (70.1 kg)   SpO2 100%   BMI 23.51 kg/m   Wt Readings from Last 3 Encounters:  02/17/20 154 lb 9.6 oz (70.1 kg)  09/17/19 155 lb (70.3 kg)  09/17/19 155 lb (70.3 kg)    Physical Exam Vitals and nursing note reviewed.  Constitutional:      General: He is not in acute distress.    Appearance: Normal appearance. He is not ill-appearing, toxic-appearing or diaphoretic.  HENT:     Head: Normocephalic and atraumatic.     Right Ear: External ear normal.     Left Ear: External ear normal.     Nose: Nose normal.     Mouth/Throat:     Mouth: Mucous membranes are moist.     Pharynx: Oropharynx is clear.  Eyes:     General: No scleral icterus.       Right eye: No discharge.        Left eye: No discharge.     Extraocular Movements: Extraocular movements intact.     Conjunctiva/sclera: Conjunctivae normal.     Pupils: Pupils are equal, round, and reactive to light.  Cardiovascular:     Rate and Rhythm: Normal rate and regular rhythm.     Pulses: Normal pulses.     Heart sounds: Normal heart sounds. No murmur heard.  No friction rub. No gallop.   Pulmonary:     Effort: Pulmonary effort is normal. No respiratory distress.     Breath sounds: Normal breath sounds. No stridor. No wheezing, rhonchi or rales.  Chest:     Chest wall: No tenderness.  Musculoskeletal:        General: Normal range of motion.     Cervical back: Normal range of motion and neck supple.  Skin:    General: Skin is warm and dry.     Capillary Refill: Capillary refill takes less than 2 seconds.     Coloration: Skin is not jaundiced or pale.     Findings: No bruising, erythema, lesion or rash.  Neurological:     General: No focal deficit present.     Mental Status: He is alert and oriented to person, place, and time. Mental status is at  baseline.  Psychiatric:        Mood and Affect: Mood normal.        Behavior: Behavior normal.        Thought Content: Thought content normal.        Judgment: Judgment normal.     Results for orders placed or performed in visit on 02/07/18  Comprehensive metabolic panel  Result Value Ref Range   Glucose 89 65 - 99 mg/dL   BUN 13 6 - 24 mg/dL   Creatinine, Ser 1.00 0.76 - 1.27 mg/dL   GFR calc non Af Amer 90 >59 mL/min/1.73  GFR calc Af Amer 104 >59 mL/min/1.73   BUN/Creatinine Ratio 13 9 - 20   Sodium 139 134 - 144 mmol/L   Potassium 4.1 3.5 - 5.2 mmol/L   Chloride 98 96 - 106 mmol/L   CO2 25 20 - 29 mmol/L   Calcium 9.3 8.7 - 10.2 mg/dL   Total Protein 7.2 6.0 - 8.5 g/dL   Albumin 4.5 3.5 - 5.5 g/dL   Globulin, Total 2.7 1.5 - 4.5 g/dL   Albumin/Globulin Ratio 1.7 1.2 - 2.2   Bilirubin Total 0.4 0.0 - 1.2 mg/dL   Alkaline Phosphatase 85 39 - 117 IU/L   AST 30 0 - 40 IU/L   ALT 57 (H) 0 - 44 IU/L  Lipid Panel w/o Chol/HDL Ratio  Result Value Ref Range   Cholesterol, Total 187 100 - 199 mg/dL   Triglycerides 519 (H) 0 - 149 mg/dL   HDL 56 >39 mg/dL   VLDL Cholesterol Cal Comment 5 - 40 mg/dL   LDL Calculated Comment 0 - 99 mg/dL  CBC with Differential/Platelet  Result Value Ref Range   WBC 8.3 3.4 - 10.8 x10E3/uL   RBC 4.64 4.14 - 5.80 x10E6/uL   Hemoglobin 15.8 13.0 - 17.7 g/dL   Hematocrit 45.2 37.5 - 51.0 %   MCV 97 79 - 97 fL   MCH 34.1 (H) 26.6 - 33.0 pg   MCHC 35.0 31 - 35 g/dL   RDW 14.2 12.3 - 15.4 %   Platelets 285 150 - 450 x10E3/uL   Neutrophils 47 Not Estab. %   Lymphs 35 Not Estab. %   Monocytes 10 Not Estab. %   Eos 3 Not Estab. %   Basos 1 Not Estab. %   Neutrophils Absolute 3.8 1 - 7 x10E3/uL   Lymphocytes Absolute 2.9 0 - 3 x10E3/uL   Monocytes Absolute 0.8 0 - 0 x10E3/uL   EOS (ABSOLUTE) 0.3 0.0 - 0.4 x10E3/uL   Basophils Absolute 0.1 0 - 0 x10E3/uL   Immature Granulocytes 4 Not Estab. %   Immature Grans (Abs) 0.3 (H) 0.0 - 0.1 x10E3/uL        Assessment & Plan:   Problem List Items Addressed This Visit      Respiratory   Asthma    Not doing great. Working on quitting smoking, will recheck 1 month, if still needing albuterol 2-3x a day, will add spiriva.       Relevant Medications   Fluticasone-Salmeterol (ADVAIR DISKUS) 250-50 MCG/DOSE AEPB   albuterol (VENTOLIN HFA) 108 (90 Base) MCG/ACT inhaler   albuterol (PROVENTIL) (2.5 MG/3ML) 0.083% nebulizer solution     Other   CIGARETTE SMOKER    Will start wellbutrin and nicoderm patches. Recheck 1 month. Call with any concerns.       Panic disorder    Under good control on current regimen. Continue current regimen. Continue to monitor. Call with any concerns. Refills given. Refills should last about 6 months.        Relevant Medications   buPROPion (WELLBUTRIN SR) 150 MG 12 hr tablet   Hyperlipidemia - Primary    Under good control on current regimen. Continue current regimen. Continue to monitor. Call with any concerns. Refills given. Labs drawn today.       Relevant Medications   atorvastatin (LIPITOR) 40 MG tablet   Other Relevant Orders   Comprehensive metabolic panel   Lipid Panel w/o Chol/HDL Ratio    Other Visit Diagnoses    Screening for HIV without presence of  risk factors       Labs drawn today. Await results.    Relevant Orders   HIV Antibody (routine testing w rflx)   Need for hepatitis C screening test       Labs drawn today. Await results.    Relevant Orders   Hepatitis C Antibody   Long-term use of high-risk medication       Labs drawn today. Await results.    Relevant Orders   [id number] 11+Oxyco+Alc+Crt-Bund   Screening for colon cancer       Cologuard ordered today. Await results.    Relevant Orders   Cologuard       Follow up plan: Return in about 4 weeks (around 03/16/2020) for smoking follow up.

## 2020-02-17 NOTE — Assessment & Plan Note (Signed)
Under good control on current regimen. Continue current regimen. Continue to monitor. Call with any concerns. Refills given. Labs drawn today.   

## 2020-02-17 NOTE — Assessment & Plan Note (Signed)
Will start wellbutrin and nicoderm patches. Recheck 1 month. Call with any concerns.

## 2020-02-17 NOTE — Patient Instructions (Signed)
Pneumococcal Polysaccharide Vaccine (PPSV23): What You Need to Know 1. Why get vaccinated? Pneumococcal polysaccharide vaccine (PPSV23) can prevent pneumococcal disease. Pneumococcal disease refers to any illness caused by pneumococcal bacteria. These bacteria can cause many types of illnesses, including pneumonia, which is an infection of the lungs. Pneumococcal bacteria are one of the most common causes of pneumonia. Besides pneumonia, pneumococcal bacteria can also cause:  Ear infections  Sinus infections  Meningitis (infection of the tissue covering the brain and spinal cord)  Bacteremia (bloodstream infection) Anyone can get pneumococcal disease, but children under 2 years of age, people with certain medical conditions, adults 65 years or older, and cigarette smokers are at the highest risk. Most pneumococcal infections are mild. However, some can result in long-term problems, such as brain damage or hearing loss. Meningitis, bacteremia, and pneumonia caused by pneumococcal disease can be fatal. 2. PPSV23 PPSV23 protects against 23 types of bacteria that cause pneumococcal disease. PPSV23 is recommended for:  All adults 65 years or older,  Anyone 2 years or older with certain medical conditions that can lead to an increased risk for pneumococcal disease. Most people need only one dose of PPSV23. A second dose of PPSV23, and another type of pneumococcal vaccine called PCV13, are recommended for certain high-risk groups. Your health care provider can give you more information. People 65 years or older should get a dose of PPSV23 even if they have already gotten one or more doses of the vaccine before they turned 65. 3. Talk with your health care provider Tell your vaccine provider if the person getting the vaccine:  Has had an allergic reaction after a previous dose of PPSV23, or has any severe, life-threatening allergies. In some cases, your health care provider may decide to postpone  PPSV23 vaccination to a future visit. People with minor illnesses, such as a cold, may be vaccinated. People who are moderately or severely ill should usually wait until they recover before getting PPSV23. Your health care provider can give you more information. 4. Risks of a vaccine reaction  Redness or pain where the shot is given, feeling tired, fever, or muscle aches can happen after PPSV23. People sometimes faint after medical procedures, including vaccination. Tell your provider if you feel dizzy or have vision changes or ringing in the ears. As with any medicine, there is a very remote chance of a vaccine causing a severe allergic reaction, other serious injury, or death. 5. What if there is a serious problem? An allergic reaction could occur after the vaccinated person leaves the clinic. If you see signs of a severe allergic reaction (hives, swelling of the face and throat, difficulty breathing, a fast heartbeat, dizziness, or weakness), call 9-1-1 and get the person to the nearest hospital. For other signs that concern you, call your health care provider. Adverse reactions should be reported to the Vaccine Adverse Event Reporting System (VAERS). Your health care provider will usually file this report, or you can do it yourself. Visit the VAERS website at www.vaers.hhs.gov or call 1-800-822-7967. VAERS is only for reporting reactions, and VAERS staff do not give medical advice. 6. How can I learn more?  Ask your health care provider.  Call your local or state health department.  Contact the Centers for Disease Control and Prevention (CDC): ? Call 1-800-232-4636 (1-800-CDC-INFO) or ? Visit CDC's website at www.cdc.gov/vaccines CDC Vaccine Information Statement PPSV23 Vaccine (05/28/2018) This information is not intended to replace advice given to you by your health care provider. Make sure you   discuss any questions you have with your health care provider. Document Revised: 11/04/2018  Document Reviewed: 02/25/2018 Elsevier Patient Education  2020 Elsevier Inc.  

## 2020-02-17 NOTE — Assessment & Plan Note (Signed)
Not doing great. Working on quitting smoking, will recheck 1 month, if still needing albuterol 2-3x a day, will add spiriva.

## 2020-02-18 LAB — COMPREHENSIVE METABOLIC PANEL
ALT: 25 IU/L (ref 0–44)
AST: 16 IU/L (ref 0–40)
Albumin/Globulin Ratio: 2 (ref 1.2–2.2)
Albumin: 4.5 g/dL (ref 4.0–5.0)
Alkaline Phosphatase: 93 IU/L (ref 48–121)
BUN/Creatinine Ratio: 9 (ref 9–20)
BUN: 7 mg/dL (ref 6–24)
Bilirubin Total: <0.2 mg/dL (ref 0.0–1.2)
CO2: 25 mmol/L (ref 20–29)
Calcium: 9.8 mg/dL (ref 8.7–10.2)
Chloride: 101 mmol/L (ref 96–106)
Creatinine, Ser: 0.81 mg/dL (ref 0.76–1.27)
GFR calc Af Amer: 121 mL/min/{1.73_m2} (ref >59)
GFR calc non Af Amer: 105 mL/min/{1.73_m2} (ref >59)
Globulin, Total: 2.3 g/dL (ref 1.5–4.5)
Glucose: 90 mg/dL (ref 65–99)
Potassium: 5 mmol/L (ref 3.5–5.2)
Sodium: 141 mmol/L (ref 134–144)
Total Protein: 6.8 g/dL (ref 6.0–8.5)

## 2020-02-18 LAB — LIPID PANEL W/O CHOL/HDL RATIO
Cholesterol, Total: 206 mg/dL — ABNORMAL HIGH (ref 100–199)
HDL: 45 mg/dL (ref >39)
LDL Chol Calc (NIH): 62 mg/dL (ref 0–99)
Triglycerides: 659 mg/dL (ref 0–149)
VLDL Cholesterol Cal: 99 mg/dL — ABNORMAL HIGH (ref 5–40)

## 2020-02-18 LAB — HIV ANTIBODY (ROUTINE TESTING W REFLEX): HIV Screen 4th Generation wRfx: NONREACTIVE

## 2020-02-18 LAB — HEPATITIS C ANTIBODY: Hep C Virus Ab: <0.1 s/co ratio (ref 0.0–0.9)

## 2020-02-19 ENCOUNTER — Other Ambulatory Visit: Payer: Self-pay | Admitting: Family Medicine

## 2020-02-19 MED ORDER — ROSUVASTATIN CALCIUM 40 MG PO TABS: 40.0000 mg | ORAL_TABLET | Freq: Every day | ORAL | 1 refills | Status: DC

## 2020-02-21 LAB — BENZODIAZEPINES CONFIRM, URINE
Alprazolam Conf.: 132 ng/mL
Alprazolam: POSITIVE — AB
Benzodiazepines: POSITIVE ng/mL — AB
Clonazepam: NEGATIVE
Flurazepam: NEGATIVE
Lorazepam: NEGATIVE
Midazolam: NEGATIVE
Nordiazepam: NEGATIVE
Oxazepam: NEGATIVE
Temazepam: NEGATIVE
Triazolam: NEGATIVE

## 2020-02-21 LAB — DRUG SCREEN 764883 11+OXYCO+ALC+CRT-BUND
Amphetamines, Urine: NEGATIVE ng/mL
Barbiturate: NEGATIVE ng/mL
Cannabinoid Quant, Ur: NEGATIVE ng/mL
Cocaine (Metabolite): NEGATIVE ng/mL
Creatinine: 128.9 mg/dL (ref 20.0–300.0)
Meperidine: NEGATIVE ng/mL
Methadone Screen, Urine: NEGATIVE ng/mL
OPIATE SCREEN URINE: NEGATIVE ng/mL
Oxycodone/Oxymorphone, Urine: NEGATIVE ng/mL
Phencyclidine: NEGATIVE ng/mL
Propoxyphene: NEGATIVE ng/mL
Tramadol: NEGATIVE ng/mL
pH, Urine: 5.3 (ref 4.5–8.9)

## 2020-02-21 LAB — PANEL 764016: Ethanol: 0.021 %

## 2020-03-14 ENCOUNTER — Other Ambulatory Visit: Payer: Self-pay | Admitting: Family Medicine

## 2020-03-14 NOTE — Telephone Encounter (Signed)
° °  Notes to clinic: Patient requesting a 90 day supply    Requested Prescriptions  Pending Prescriptions Disp Refills   buPROPion (WELLBUTRIN SR) 150 MG 12 hr tablet [Pharmacy Med Name: BUPROPION HCL SR 150 MG TABLET] 180 tablet 2    Sig: 1 pill in the AM for 3 days, then increase to 1 pill 2x a day, pick a day in the 2nd week to try to quit      Psychiatry: Antidepressants - bupropion Failed - 03/14/2020  9:32 AM      Failed - Last BP in normal range    BP Readings from Last 1 Encounters:  02/17/20 (!) 148/89          Passed - Valid encounter within last 6 months    Recent Outpatient Visits           3 weeks ago Mixed hyperlipidemia   [facility] [place], [place], [place]   5 months ago Mild intermittent asthma with acute exacerbation   [place], [place]   5 months ago Erroneous encounter - disregard   [facility] [place], [place]   9 months ago Mixed hyperlipidemia   [facility] [place], [place], [place]   1 year ago Acute maxillary sinusitis, recurrence not specified   [facility] [place], [place]       Future Appointments             In 2 days Wynetta Emery, Barb Merino, DO [employer], [facility]

## 2020-03-14 NOTE — Telephone Encounter (Signed)
LOV: 02/17/20   Last filled 02/17/20 for 60 with 3 refills. Should not be due.

## 2020-03-16 ENCOUNTER — Other Ambulatory Visit: Payer: Self-pay

## 2020-03-16 ENCOUNTER — Ambulatory Visit (INDEPENDENT_AMBULATORY_CARE_PROVIDER_SITE_OTHER): Payer: 59 | Admitting: Family Medicine

## 2020-03-16 ENCOUNTER — Encounter: Payer: Self-pay | Admitting: Family Medicine

## 2020-03-16 VITALS — BP 122/78 | HR 88 | Temp 98.6°F | Wt 153.4 lb

## 2020-03-16 DIAGNOSIS — M7702 Medial epicondylitis, left elbow: Secondary | ICD-10-CM | POA: Diagnosis not present

## 2020-03-16 DIAGNOSIS — Z23 Encounter for immunization: Secondary | ICD-10-CM

## 2020-03-16 MED ORDER — KETOROLAC TROMETHAMINE 60 MG/2ML IM SOLN
60.0000 mg | Freq: Once | INTRAMUSCULAR | Status: AC
  Administered 2020-03-16: 60 mg via INTRAMUSCULAR

## 2020-03-16 MED ORDER — NAPROXEN 500 MG PO TABS
500.0000 mg | ORAL_TABLET | Freq: Two times a day (BID) | ORAL | 3 refills | Status: DC
Start: 2020-03-16 — End: 2020-11-03

## 2020-03-16 MED ORDER — KETOROLAC TROMETHAMINE 30 MG/ML IJ SOLN: 30.0000 mg | Freq: Once | INTRAMUSCULAR | Status: DC

## 2020-03-16 NOTE — Progress Notes (Signed)
BP 122/78 (BP Location: Left Arm, Patient Position: Sitting, Cuff Size: Normal)   Pulse 88   Temp 98.6 F (37 C) (Oral)   Wt 153 lb 6.4 oz (69.6 kg)   SpO2 100%   BMI 23.32 kg/m    Subjective:    Patient ID: Timothy Finley, male    DOB: 1971/06/28, 49 y.o.   MRN: [id number]  HPI: Timothy Finley is a 49 y.o. male  Chief Complaint  Patient presents with  . Nicotine Dependence    follow up  . Hyperlipidemia  . Elbow Pain   Has not started his wellbutrin yet. He is still smoking the same amount.   HYPERLIPIDEMIA- has not started his crestor.   ARM PAIN- Has been having L elbow pain.  Duration: weeks Location: L elbow Mechanism of injury: unknown Onset: gradual Severity: moderate  Quality:  sharp Frequency: with movement Radiation: no Aggravating factors:movement   Alleviating factors:none   Status: worse Treatments attempted: rest  Relief with NSAIDs?:  No NSAIDs Taken Swelling: no Redness: no  Warmth: no Trauma: no Chest pain: no  Shortness of breath: no  Fever: no Decreased sensation: no Paresthesias: no Weakness: no   Relevant past medical, surgical, family and social history reviewed and updated as indicated. Interim medical history since our last visit reviewed. Allergies and medications reviewed and updated.  Review of Systems  Constitutional: Negative.   Respiratory: Negative.   Cardiovascular: Negative.   Gastrointestinal: Negative.   Musculoskeletal: Positive for arthralgias and myalgias. Negative for back pain, gait problem, joint swelling, neck pain and neck stiffness.  Skin: Negative.   Psychiatric/Behavioral: Negative.     Per HPI unless specifically indicated above     Objective:    BP 122/78 (BP Location: Left Arm, Patient Position: Sitting, Cuff Size: Normal)   Pulse 88   Temp 98.6 F (37 C) (Oral)   Wt 153 lb 6.4 oz (69.6 kg)   SpO2 100%   BMI 23.32 kg/m   Wt Readings from Last 3 Encounters:  03/16/20 153 lb 6.4 oz  (69.6 kg)  02/17/20 154 lb 9.6 oz (70.1 kg)  09/17/19 155 lb (70.3 kg)    Physical Exam Vitals and nursing note reviewed.  Constitutional:      General: He is not in acute distress.    Appearance: Normal appearance. He is not ill-appearing, toxic-appearing or diaphoretic.  HENT:     Head: Normocephalic and atraumatic.     Right Ear: External ear normal.     Left Ear: External ear normal.     Nose: Nose normal.     Mouth/Throat:     Mouth: Mucous membranes are moist.     Pharynx: Oropharynx is clear.  Eyes:     General: No scleral icterus.       Right eye: No discharge.        Left eye: No discharge.     Extraocular Movements: Extraocular movements intact.     Conjunctiva/sclera: Conjunctivae normal.     Pupils: Pupils are equal, round, and reactive to light.  Cardiovascular:     Rate and Rhythm: Normal rate and regular rhythm.     Pulses: Normal pulses.     Heart sounds: Normal heart sounds. No murmur heard.  No friction rub. No gallop.   Pulmonary:     Effort: Pulmonary effort is normal. No respiratory distress.     Breath sounds: Normal breath sounds. No stridor. No wheezing, rhonchi or rales.  Chest:  Chest wall: No tenderness.  Musculoskeletal:        General: Tenderness (over L medial epicondyle) present. Normal range of motion.     Cervical back: Normal range of motion and neck supple.  Skin:    General: Skin is warm and dry.     Capillary Refill: Capillary refill takes less than 2 seconds.     Coloration: Skin is not jaundiced or pale.     Findings: No bruising, erythema, lesion or rash.  Neurological:     General: No focal deficit present.     Mental Status: He is alert and oriented to person, place, and time. Mental status is at baseline.  Psychiatric:        Mood and Affect: Mood normal.        Behavior: Behavior normal.        Thought Content: Thought content normal.        Judgment: Judgment normal.     Results for orders placed or performed in  visit on 02/17/20  Comprehensive metabolic panel  Result Value Ref Range   Glucose 90 65 - 99 mg/dL   BUN 7 6 - 24 mg/dL   Creatinine, Ser 0.81 0.76 - 1.27 mg/dL   GFR calc non Af Amer 105 >59 mL/min/1.73   GFR calc Af Amer 121 >59 mL/min/1.73   BUN/Creatinine Ratio 9 9 - 20   Sodium 141 134 - 144 mmol/L   Potassium 5.0 3.5 - 5.2 mmol/L   Chloride 101 96 - 106 mmol/L   CO2 25 20 - 29 mmol/L   Calcium 9.8 8.7 - 10.2 mg/dL   Total Protein 6.8 6.0 - 8.5 g/dL   Albumin 4.5 4.0 - 5.0 g/dL   Globulin, Total 2.3 1.5 - 4.5 g/dL   Albumin/Globulin Ratio 2.0 1.2 - 2.2   Bilirubin Total <0.2 0.0 - 1.2 mg/dL   Alkaline Phosphatase 93 48 - 121 IU/L   AST 16 0 - 40 IU/L   ALT 25 0 - 44 IU/L  Lipid Panel w/o Chol/HDL Ratio  Result Value Ref Range   Cholesterol, Total 206 (H) 100 - 199 mg/dL   Triglycerides 659 (HH) 0 - 149 mg/dL   HDL 45 >39 mg/dL   VLDL Cholesterol Cal 99 (H) 5 - 40 mg/dL   LDL Chol Calc (NIH) 62 0 - 99 mg/dL  HIV Antibody (routine testing w rflx)  Result Value Ref Range   HIV Screen 4th Generation wRfx Non Reactive Non Reactive  Hepatitis C Antibody  Result Value Ref Range   Hep C Virus Ab <0.1 0.0 - 0.9 s/co ratio  [id number] 11+Oxyco+Alc+Crt-Bund  Result Value Ref Range   Ethanol See Final Results Cutoff=0.020 %   Amphetamines, Urine Negative Cutoff=1000 ng/mL   Barbiturate Negative Cutoff=200 ng/mL   BENZODIAZ UR QL See Final Results Cutoff=200 ng/mL   Cannabinoid Quant, Ur Negative Cutoff=50 ng/mL   Cocaine (Metabolite) Negative Cutoff=300 ng/mL   OPIATE SCREEN URINE Negative Cutoff=300 ng/mL   Oxycodone/Oxymorphone, Urine Negative Cutoff=300 ng/mL   Phencyclidine Negative Cutoff=25 ng/mL   Methadone Screen, Urine Negative Cutoff=300 ng/mL   Propoxyphene Negative Cutoff=300 ng/mL   Meperidine Negative Cutoff=200 ng/mL   Tramadol Negative Cutoff=200 ng/mL   Creatinine 128.9 20.0 - 300.0 mg/dL   pH, Urine 5.3 4.5 - 8.9  Panel [id number]  Result Value Ref Range    Ethanol 0.021 Cutoff=0.020 %  Benzodiazepines Confirm, Urine  Result Value Ref Range   Benzodiazepines Positive (A) Cutoff=100 ng/mL   Nordiazepam Negative Cutoff=100  Oxazepam Negative Cutoff=100   Flurazepam Negative Cutoff=100   Lorazepam Negative Cutoff=100   Alprazolam Positive (A)    Alprazolam Conf. 132 Cutoff=100 ng/mL   Clonazepam Negative Cutoff=100   Temazepam Negative Cutoff=100   Triazolam Negative Cutoff=100   Midazolam Negative Cutoff=100      Assessment & Plan:   Problem List Items Addressed This Visit    None    Visit Diagnoses    Medial epicondylitis of left elbow    -  Primary   Will start naproxen, brace and exercises. If not getting better, will refer to ortho. Call with any concerns.    Relevant Medications   naproxen (NAPROSYN) 500 MG tablet   ketorolac (TORADOL) injection 60 mg (Completed)       Follow up plan: Return in about 4 weeks (around 04/13/2020).

## 2020-03-16 NOTE — Patient Instructions (Addendum)
[facility] Ask your health care provider which exercises are safe for you. Do exercises exactly as told by your health care provider and adjust them as directed. It is normal to feel mild stretching, pulling, tightness, or discomfort as you do these exercises. Stop right away if you feel sudden pain or your pain gets worse. Do not begin these exercises until told by your health care provider. Stretching and range-of-motion exercises These exercises warm up your muscles and joints and improve the movement and flexibility of your elbow. Wrist extension  1. Straighten your left / right elbow in front of you with your palm facing up toward the ceiling. ? If told by your health care provider, bend your left / right elbow to a 90-degree angle (right angle) at your side. 2. With your other hand, gently pull your left / right hand and fingers toward the floor (extension). Stop when you feel a gentle stretch on the palm side of your forearm. 3. Hold this position for __________ seconds. Repeat __________ times. Complete this exercise __________ times a day. Wrist flexion  1. Straighten your left / right elbow in front of you with your palm facing down toward the floor. ? If told by your health care provider, bend your left / right elbow to a 90-degree angle (right angle) at your side. 2. With your other hand, gently push over the back of your left / right hand so your fingers point toward the floor (flexion). Stop when you feel a gentle stretch on the back of your forearm. 3. Hold this position for __________ seconds. Repeat __________ times. Complete this exercise __________ times a day. Forearm rotation, supination 1. Sit or stand with your elbows at your side. 2. Bend your left / right elbow to a 90-degree angle (right angle). 3. Using your uninjured hand, turn your left / right palm up toward the ceiling (supination) until you feel a gentle stretch along the inside of your forearm. 4. Hold  this position for __________ seconds. Repeat __________ times. Complete this exercise __________ times a day. Forearm rotation, pronation 1. Sit or stand with your elbows at your side. 2. Bend your left / right elbow to a 90-degree angle (right angle). 3. Using your uninjured hand, turn your left / right palm down toward the floor (pronation) until you feel a gentle stretch along the top of your forearm. 4. Hold this position for __________ seconds. Repeat __________ times. Complete this exercise __________ times a day. Strengthening exercises These exercises build strength and endurance in your elbow. Endurance is the ability to use your muscles for a long time, even after they get tired. Wrist flexion  1. Sit with your left / right forearm supported on a table or other surface and your palm turned up toward the ceiling. Let your left / right wrist extend over the edge of the surface. 2. Hold a __________ weight or a piece of rubber exercise band or tubing. ? If using a rubber exercise band or tubing, hold the other end of the tubing with your other hand. 3. Slowly bend your wrist so your hand moves up toward the ceiling (flexion). Try to only move your wrist and keep the rest of your arm still. 4. Hold this position for __________ seconds. 5. Slowly return to the starting position. Repeat __________ times. Complete this exercise __________ times a day. Wrist flexion, eccentric 1. Sit with your left / right forearm palm-up and supported on a table or other surface.   Let your left / right wrist extend over the edge of the surface. 2. Hold a __________ weight or a piece of rubber exercise band or tubing in your left / right hand. ? If using a rubber exercise band or tubing, hold the other end of the tubing with your other hand. 3. Use your uninjured hand to move your left / right hand up toward the ceiling. 4. Take your uninjured hand away and slowly return to the starting position using only  your left / right hand (eccentric flexion). Repeat __________ times. Complete this exercise __________ times a day. Forearm rotation, pronation To do this exercise, you will need a lightweight hammer or rubber mallet. 1. Sit with your left / right forearm supported on a table or other surface. Bend your elbow to a 90-degree angle (right angle). Position your forearm so that your palm is facing up toward the ceiling, with your hand resting over the edge of the table. 2. Hold a hammer in your left / right hand. ? To make this exercise easier, hold the hammer near the head of the hammer. ? To make this exercise harder, hold the hammer near the end of the handle. 3. Without moving your elbow, slowly turn (rotate) your forearm so your palm faces down toward the floor (pronation). 4. Hold this position for __________ seconds. 5. Slowly return to the starting position. Repeat __________ times. Complete this exercise __________ times a day. Shoulder blade squeeze 1. Sit in a stable chair or stand with good posture. If you are sitting down, do not let your back touch the back of the chair. 2. Your arms should be at your sides with your elbows bent to a 90-degree angle (right angle). Position your forearms so that your thumbs are facing the ceiling (neutral position). 3. Without lifting your shoulders up, squeeze your shoulder blades tightly together. 4. Hold this position for __________ seconds. 5. Slowly release and return to the starting position. Repeat __________ times. Complete this exercise __________ times a day. This information is not intended to replace advice given to you by your health care provider. Make sure you discuss any questions you have with your health care provider. Document Revised: 11/06/2018 Document Reviewed: 09/09/2018 Elsevier Patient Education  2020 [employer] Inc.  

## 2020-04-13 ENCOUNTER — Ambulatory Visit: Payer: 59 | Admitting: Family Medicine

## 2020-04-20 ENCOUNTER — Telehealth: Payer: Self-pay | Admitting: Family Medicine

## 2020-04-20 ENCOUNTER — Telehealth (INDEPENDENT_AMBULATORY_CARE_PROVIDER_SITE_OTHER): Payer: 59 | Admitting: Unknown Physician Specialty

## 2020-04-20 ENCOUNTER — Encounter: Payer: Self-pay | Admitting: Unknown Physician Specialty

## 2020-04-20 ENCOUNTER — Other Ambulatory Visit: Payer: Self-pay

## 2020-04-20 DIAGNOSIS — R059 Cough, unspecified: Secondary | ICD-10-CM

## 2020-04-20 DIAGNOSIS — J329 Chronic sinusitis, unspecified: Secondary | ICD-10-CM

## 2020-04-20 DIAGNOSIS — R05 Cough: Secondary | ICD-10-CM | POA: Diagnosis not present

## 2020-04-20 MED ORDER — DOXYCYCLINE HYCLATE 100 MG PO TABS: 100.0000 mg | ORAL_TABLET | Freq: Two times a day (BID) | ORAL | 0 refills | Status: DC

## 2020-04-20 MED ORDER — BENZONATATE 200 MG PO CAPS: 200.0000 mg | ORAL_CAPSULE | Freq: Two times a day (BID) | ORAL | 0 refills | Status: DC | PRN

## 2020-04-20 NOTE — Progress Notes (Signed)
There were no vitals taken for this visit.   Subjective:    Patient ID: Timothy Finley, male    DOB: [birth year], 49 y.o.   MRN: [id number]  HPI: Timothy Finley is a 49 y.o. male  Chief Complaint  Patient presents with  . Sinusitis    sinus and chest congestion, cough onset week   Leaving for daughter's wedding tomorrow.  He is Covid vaccinated and waiting for Covid test results.   Denies Covid contacts and is very careful about exposure to others.  ja  Due to the catastrophic nature of the COVID-19 pandemic, this visit was completed via audio and visual contact via [facility] due to the restrictions of the COVID-19 pandemic. All issues as above were discussed and addressed. Physical exam was done as above through visual confirmation on [facility]g[employer]. If it was felt that the patient should be evaluated in the office, they were directed there. The patient verbally consented to this visit."} . Location of the patient: home . Location of the provider: work . Those involved with this call:  . Provider: Kathrine Haddock, DNP . CMA: Yvonna Alanis, CMA . Front Desk/Registration: Jill Side  . Time spent on call: 10 minutes with patient face to face via video conference. More than 50% of this time was spent in counseling and coordination of care. 2 minutes total spent in review of patient's record and preparation of their chart.  I verified patient identity using two factors (patient name and date of birth). Patient consents verbally to being seen via telemedicine visit today.   .  Sinusitis This is a new (Pt states he gets this yearly and always needs an antibiotic) problem. The current episode started in the past 7 days. The problem is unchanged. There has been no fever. He is experiencing no pain. Associated symptoms include congestion, coughing, headaches and a hoarse voice. Pertinent negatives include no shortness of breath or sinus pressure.      Relevant past medical,  surgical, family and social history reviewed and updated as indicated. Interim medical history since our last visit reviewed. Allergies and medications reviewed and updated.  Review of Systems  HENT: Positive for congestion and hoarse voice. Negative for sinus pressure.   Respiratory: Positive for cough. Negative for shortness of breath.   Neurological: Positive for headaches.    Per HPI unless specifically indicated above     Objective:    There were no vitals taken for this visit.  Wt Readings from Last 3 Encounters:  03/16/20 153 lb 6.4 oz (69.6 kg)  02/17/20 154 lb 9.6 oz (70.1 kg)  09/17/19 155 lb (70.3 kg)    Physical Exam Constitutional:      General: He is not in acute distress.    Appearance: Normal appearance. He is well-developed.  HENT:     Head: Normocephalic and atraumatic.  Eyes:     General: Lids are normal. No scleral icterus.       Right eye: No discharge.        Left eye: No discharge.     Conjunctiva/sclera: Conjunctivae normal.  Pulmonary:     Effort: Pulmonary effort is normal.  Abdominal:     Palpations: There is no hepatomegaly or splenomegaly.  Musculoskeletal:        General: Normal range of motion.  Skin:    Coloration: Skin is not pale.     Findings: No rash.  Neurological:     Mental Status: He is alert and oriented to  person, place, and time.  Psychiatric:        Behavior: Behavior normal.        Thought Content: Thought content normal.        Judgment: Judgment normal.       Assessment & Plan:   Problem List Items Addressed This Visit    None    Visit Diagnoses    Sinusitis, unspecified chronicity, unspecified location    -  Primary   Doxycycline given only as leaving for a wedding.  Pt to hold ab if worsning symptoms or no resolution after 2 weeks   Relevant Medications   doxycycline (VIBRA-TABS) 100 MG tablet   benzonatate (TESSALON) 200 MG capsule   Cough       Covid test pending. Rx for [employer]  Pt ed on  trajectory of viral illnesses, including Covid       Antibiotic given to only take if symptoms worse after 5 days or no improvement after 10 days.     Follow up plan: F/u prn

## 2020-04-20 NOTE — Telephone Encounter (Signed)
Copied from [place] [id number]. Topic: General - Other >> Apr 20, 2020  8:39 AM Leward Quan A wrote: Reason for CRM: Patient would like a call with instructions on the My Chart visit please Ph# [phone]

## 2020-07-31 ENCOUNTER — Other Ambulatory Visit: Payer: Self-pay | Admitting: Family Medicine

## 2020-09-30 ENCOUNTER — Other Ambulatory Visit: Payer: Self-pay | Admitting: Family Medicine

## 2020-10-04 ENCOUNTER — Other Ambulatory Visit: Payer: Self-pay | Admitting: Family Medicine

## 2020-10-04 NOTE — Telephone Encounter (Signed)
Scheduled for 3/25.

## 2020-10-04 NOTE — Telephone Encounter (Signed)
Please get patient scheduled for a follow up

## 2020-10-04 NOTE — Telephone Encounter (Signed)
Needs follow-up

## 2020-10-04 NOTE — Telephone Encounter (Signed)
Requested medications are due for refill today NO  Requested medications are on the active medication list yes  Last refill 2/16  Last visit 12/2019  Future visit scheduled no  Notes to clinic asking for inhaler too soon, please assess.

## 2020-10-04 NOTE — Telephone Encounter (Signed)
Routing to provider  

## 2020-10-21 ENCOUNTER — Ambulatory Visit: Payer: 59 | Admitting: Family Medicine

## 2020-10-28 ENCOUNTER — Ambulatory Visit: Payer: 59 | Admitting: Family Medicine

## 2020-11-03 ENCOUNTER — Ambulatory Visit (INDEPENDENT_AMBULATORY_CARE_PROVIDER_SITE_OTHER): Payer: 59 | Admitting: Family Medicine

## 2020-11-03 ENCOUNTER — Encounter: Payer: Self-pay | Admitting: Family Medicine

## 2020-11-03 ENCOUNTER — Other Ambulatory Visit: Payer: Self-pay

## 2020-11-03 VITALS — BP 139/97 | HR 95 | Temp 98.0°F | Wt 164.0 lb

## 2020-11-03 DIAGNOSIS — Z23 Encounter for immunization: Secondary | ICD-10-CM | POA: Diagnosis not present

## 2020-11-03 DIAGNOSIS — J4521 Mild intermittent asthma with (acute) exacerbation: Secondary | ICD-10-CM | POA: Diagnosis not present

## 2020-11-03 DIAGNOSIS — E782 Mixed hyperlipidemia: Secondary | ICD-10-CM | POA: Diagnosis not present

## 2020-11-03 DIAGNOSIS — F410 Panic disorder [episodic paroxysmal anxiety] without agoraphobia: Secondary | ICD-10-CM | POA: Diagnosis not present

## 2020-11-03 MED ORDER — ALBUTEROL SULFATE HFA 108 (90 BASE) MCG/ACT IN AERS: INHALATION_SPRAY | RESPIRATORY_TRACT | 1 refills | Status: DC

## 2020-11-03 MED ORDER — ROSUVASTATIN CALCIUM 40 MG PO TABS
40.0000 mg | ORAL_TABLET | Freq: Every day | ORAL | 1 refills | Status: DC
Start: 2020-11-03 — End: 2021-05-02

## 2020-11-03 MED ORDER — FLUTICASONE-SALMETEROL 250-50 MCG/DOSE IN AEPB
1.0000 | INHALATION_SPRAY | Freq: Two times a day (BID) | RESPIRATORY_TRACT | 1 refills | Status: DC
Start: 2020-11-03 — End: 2021-05-10

## 2020-11-03 MED ORDER — CLONAZEPAM 0.5 MG PO TABS: 0.5000 mg | ORAL_TABLET | Freq: Two times a day (BID) | ORAL | 1 refills | Status: DC | PRN

## 2020-11-03 MED ORDER — ALBUTEROL SULFATE (2.5 MG/3ML) 0.083% IN NEBU: 2.5000 mg | INHALATION_SOLUTION | Freq: Four times a day (QID) | RESPIRATORY_TRACT | 1 refills | Status: DC | PRN

## 2020-11-03 NOTE — Progress Notes (Signed)
BP (!) 139/97   Pulse 95   Temp 98 F (36.7 C)   Wt 164 lb (74.4 kg)   SpO2 99%   BMI 24.94 kg/m    Subjective:    Patient ID: Timothy Finley, male    DOB: 10-Apr-1971, 50 y.o.   MRN: [id number]  HPI: Timothy Finley is a 50 y.o. male  Chief Complaint  Patient presents with  . Asthma  . panic disorder  . Hyperlipidemia   HYPERLIPIDEMIA Hyperlipidemia status: excellent compliance Satisfied with current treatment?  yes Side effects:  no Medication compliance: excellent compliance Past cholesterol meds: crestor Supplements: none Aspirin:  yes The 10-year ASCVD risk score [name], et al., 2013) is: 5.2%   Values used to calculate the score:     Age: 21 years     Sex: Male     Is Non-Hispanic African American: No     Diabetic: No     Tobacco smoker: Yes     Systolic Blood Pressure: 703 mmHg     Is BP treated: No     HDL Cholesterol: 67 mg/dL     Total Cholesterol: 180 mg/dL Chest pain:  no Coronary artery disease:  no  ANXIETY/STRESS Duration: chronic Status:stable Anxious mood: yes  Excessive worrying: no Irritability: no  Sweating: no Nausea: no Palpitations:no Hyperventilation: no Panic attacks: no Agoraphobia: no  Obscessions/compulsions: no Depressed mood: no Depression screen [facility] 2/9 11/03/2020 02/17/2020 09/17/2019 05/28/2019 12/17/2018  Decreased Interest 0 0 0 0 0  Down, Depressed, Hopeless 0 0 0 0 0  PHQ - 2 Score 0 0 0 0 0  Altered sleeping 0 '1 1 2 1  ' Tired, decreased energy 0 0 0 1 1  Change in appetite 0 0 0 0 0  Feeling bad or failure about yourself  0 0 0 0 0  Trouble concentrating 0 0 0 0 0  Moving slowly or fidgety/restless 0 0 0 0 0  Suicidal thoughts 0 0 0 0 0  PHQ-9 Score 0 '1 1 3 2  ' Difficult doing work/chores Not difficult at all Not difficult at all Not difficult at all Not difficult at all Not difficult at all  Some recent data might be hidden   Anhedonia: no Weight changes: no Insomnia: no   Hypersomnia:  no Fatigue/loss of energy: no Feelings of worthlessness: no Feelings of guilt: no Impaired concentration/indecisiveness: no Suicidal ideations: no  Crying spells: no Recent Stressors/Life Changes: no   Relationship problems: no   Family stress: no     Financial stress: no    Job stress: yes    Recent death/loss: no  ASTHMA Asthma status: controlled Satisfied with current treatment?: yes Albuterol/rescue inhaler frequency: occasionally Dyspnea frequency:  occasionally Wheezing frequency:  occasionally Cough frequency:  occasionally Nocturnal symptom frequency: never Limitation of activity: no Current upper respiratory symptoms: no Aerochamber/spacer use: no  Pneumovax: Up to Date Influenza: Not up to Date  Relevant past medical, surgical, family and social history reviewed and updated as indicated. Interim medical history since our last visit reviewed. Allergies and medications reviewed and updated.  Review of Systems  Constitutional: Negative.   Respiratory: Negative.   Cardiovascular: Negative.   Gastrointestinal: Negative.   Musculoskeletal: Negative.   Skin: Negative.   Psychiatric/Behavioral: Negative.     Per HPI unless specifically indicated above     Objective:    BP (!) 139/97   Pulse 95   Temp 98 F (36.7 C)   Wt 164 lb (  74.4 kg)   SpO2 99%   BMI 24.94 kg/m   Wt Readings from Last 3 Encounters:  11/03/20 164 lb (74.4 kg)  03/16/20 153 lb 6.4 oz (69.6 kg)  02/17/20 154 lb 9.6 oz (70.1 kg)    Physical Exam Vitals and nursing note reviewed.  Constitutional:      General: He is not in acute distress.    Appearance: Normal appearance. He is not ill-appearing, toxic-appearing or diaphoretic.  HENT:     Head: Normocephalic and atraumatic.     Right Ear: External ear normal.     Left Ear: External ear normal.     Nose: Nose normal.     Mouth/Throat:     Mouth: Mucous membranes are moist.     Pharynx: Oropharynx is clear.  Eyes:     General: No  scleral icterus.       Right eye: No discharge.        Left eye: No discharge.     Extraocular Movements: Extraocular movements intact.     Conjunctiva/sclera: Conjunctivae normal.     Pupils: Pupils are equal, round, and reactive to light.  Cardiovascular:     Rate and Rhythm: Normal rate and regular rhythm.     Pulses: Normal pulses.     Heart sounds: Normal heart sounds. No murmur heard. No friction rub. No gallop.   Pulmonary:     Effort: Pulmonary effort is normal. No respiratory distress.     Breath sounds: Normal breath sounds. No stridor. No wheezing, rhonchi or rales.  Chest:     Chest wall: No tenderness.  Musculoskeletal:        General: Normal range of motion.     Cervical back: Normal range of motion and neck supple.  Skin:    General: Skin is warm and dry.     Capillary Refill: Capillary refill takes less than 2 seconds.     Coloration: Skin is not jaundiced or pale.     Findings: No bruising, erythema, lesion or rash.  Neurological:     General: No focal deficit present.     Mental Status: He is alert and oriented to person, place, and time. Mental status is at baseline.  Psychiatric:        Mood and Affect: Mood normal.        Behavior: Behavior normal.        Thought Content: Thought content normal.        Judgment: Judgment normal.     Results for orders placed or performed in visit on 11/03/20  Comprehensive metabolic panel  Result Value Ref Range   Glucose 97 65 - 99 mg/dL   BUN 9 6 - 24 mg/dL   Creatinine, Ser 0.85 0.76 - 1.27 mg/dL   eGFR 107 >59 mL/min/1.73   BUN/Creatinine Ratio 11 9 - 20   Sodium 142 134 - 144 mmol/L   Potassium 4.9 3.5 - 5.2 mmol/L   Chloride 101 96 - 106 mmol/L   CO2 22 20 - 29 mmol/L   Calcium 9.6 8.7 - 10.2 mg/dL   Total Protein 6.9 6.0 - 8.5 g/dL   Albumin 4.6 4.0 - 5.0 g/dL   Globulin, Total 2.3 1.5 - 4.5 g/dL   Albumin/Globulin Ratio 2.0 1.2 - 2.2   Bilirubin Total 0.3 0.0 - 1.2 mg/dL   Alkaline Phosphatase 119 44 -  121 IU/L   AST 37 0 - 40 IU/L   ALT 81 (H) 0 - 44 IU/L  Lipid  Panel w/o Chol/HDL Ratio  Result Value Ref Range   Cholesterol, Total 180 100 - 199 mg/dL   Triglycerides 487 (H) 0 - 149 mg/dL   HDL 67 >39 mg/dL   VLDL Cholesterol Cal 69 (H) 5 - 40 mg/dL   LDL Chol Calc (NIH) 44 0 - 99 mg/dL      Assessment & Plan:   Problem List Items Addressed This Visit      Respiratory   Asthma    Under good control on current regimen. Continue current regimen. Continue to monitor. Call with any concerns. Refills given.        Relevant Medications   Fluticasone-Salmeterol (ADVAIR DISKUS) 250-50 MCG/DOSE AEPB   albuterol (VENTOLIN HFA) 108 (90 Base) MCG/ACT inhaler   albuterol (PROVENTIL) (2.5 MG/3ML) 0.083% nebulizer solution     Other   Panic disorder    Under good control on current regimen. Continue current regimen. Continue to monitor. Call with any concerns. Refills given. Refills should last 6+ months. Refills given today.      Relevant Orders   [id number] 11+Oxyco+Alc+Crt-Bund   Hyperlipidemia - Primary    Under good control on current regimen. Continue current regimen. Continue to monitor. Call with any concerns. Refills given. Labs drawn today.       Relevant Medications   rosuvastatin (CRESTOR) 40 MG tablet   Other Relevant Orders   Comprehensive metabolic panel (Completed)   Lipid Panel w/o Chol/HDL Ratio (Completed)       Follow up plan: Return in about 6 months (around 05/05/2021) for physical.

## 2020-11-04 LAB — COMPREHENSIVE METABOLIC PANEL
ALT: 81 IU/L — ABNORMAL HIGH (ref 0–44)
AST: 37 IU/L (ref 0–40)
Albumin/Globulin Ratio: 2 (ref 1.2–2.2)
Albumin: 4.6 g/dL (ref 4.0–5.0)
Alkaline Phosphatase: 119 IU/L (ref 44–121)
BUN/Creatinine Ratio: 11 (ref 9–20)
BUN: 9 mg/dL (ref 6–24)
Bilirubin Total: 0.3 mg/dL (ref 0.0–1.2)
CO2: 22 mmol/L (ref 20–29)
Calcium: 9.6 mg/dL (ref 8.7–10.2)
Chloride: 101 mmol/L (ref 96–106)
Creatinine, Ser: 0.85 mg/dL (ref 0.76–1.27)
Globulin, Total: 2.3 g/dL (ref 1.5–4.5)
Glucose: 97 mg/dL (ref 65–99)
Potassium: 4.9 mmol/L (ref 3.5–5.2)
Sodium: 142 mmol/L (ref 134–144)
Total Protein: 6.9 g/dL (ref 6.0–8.5)
eGFR: 107 mL/min/{1.73_m2} (ref >59)

## 2020-11-04 LAB — LIPID PANEL W/O CHOL/HDL RATIO
Cholesterol, Total: 180 mg/dL (ref 100–199)
HDL: 67 mg/dL (ref >39)
LDL Chol Calc (NIH): 44 mg/dL (ref 0–99)
Triglycerides: 487 mg/dL — ABNORMAL HIGH (ref 0–149)
VLDL Cholesterol Cal: 69 mg/dL — ABNORMAL HIGH (ref 5–40)

## 2020-11-04 NOTE — Assessment & Plan Note (Signed)
Under good control on current regimen. Continue current regimen. Continue to monitor. Call with any concerns. Refills given. Refills should last 6+ months. Refills given today.

## 2020-11-04 NOTE — Assessment & Plan Note (Signed)
Under good control on current regimen. Continue current regimen. Continue to monitor. Call with any concerns. Refills given.   

## 2020-11-04 NOTE — Assessment & Plan Note (Signed)
Under good control on current regimen. Continue current regimen. Continue to monitor. Call with any concerns. Refills given. Labs drawn today.   

## 2020-11-06 ENCOUNTER — Encounter: Payer: Self-pay | Admitting: Family Medicine

## 2020-11-09 LAB — BENZODIAZEPINES CONFIRM, URINE
Alprazolam Conf.: 166 ng/mL
Alprazolam: POSITIVE — AB
Benzodiazepines: POSITIVE ng/mL — AB
Clonazepam: NEGATIVE
Flurazepam: NEGATIVE
Lorazepam: NEGATIVE
Midazolam: NEGATIVE
Nordiazepam: NEGATIVE
Oxazepam: NEGATIVE
Temazepam: NEGATIVE
Triazolam: NEGATIVE

## 2020-11-09 LAB — DRUG SCREEN 764883 11+OXYCO+ALC+CRT-BUND
Amphetamines, Urine: NEGATIVE ng/mL
Barbiturate: NEGATIVE ng/mL
Cannabinoid Quant, Ur: NEGATIVE ng/mL
Cocaine (Metabolite): NEGATIVE ng/mL
Creatinine: 88.3 mg/dL (ref 20.0–300.0)
Ethanol: NEGATIVE %
Meperidine: NEGATIVE ng/mL
Methadone Screen, Urine: NEGATIVE ng/mL
OPIATE SCREEN URINE: NEGATIVE ng/mL
Oxycodone/Oxymorphone, Urine: NEGATIVE ng/mL
Phencyclidine: NEGATIVE ng/mL
Propoxyphene: NEGATIVE ng/mL
Tramadol: NEGATIVE ng/mL
pH, Urine: 6.2 (ref 4.5–8.9)

## 2020-12-25 ENCOUNTER — Other Ambulatory Visit: Payer: Self-pay | Admitting: Family Medicine

## 2020-12-25 NOTE — Telephone Encounter (Signed)
Requested Prescriptions  Pending Prescriptions Disp Refills  . albuterol (VENTOLIN HFA) 108 (90 Base) MCG/ACT inhaler [Pharmacy Med Name: ALBUTEROL HFA (VENTOLIN) INH] 18 each 1    Sig: TAKE 2 PUFFS BY MOUTH EVERY 6 HOURS AS NEEDED FOR WHEEZE     Pulmonology:  Beta Agonists Failed - 12/25/2020 12:51 AM      Failed - One inhaler should last at least one month. If the patient is requesting refills earlier, contact the patient to check for uncontrolled symptoms.      Passed - Valid encounter within last 12 months    Recent Outpatient Visits          1 month ago Mixed hyperlipidemia   [place], Megan P, DO   8 months ago Sinusitis, unspecified chronicity, unspecified location   [place], NP   9 months ago Medial epicondylitis of left elbow   [place], [place], DO   10 months ago Mixed hyperlipidemia   [facility] [place], [place], DO   1 year ago Mild intermittent asthma with acute exacerbation   [facility] [place], [place]      Future Appointments            In 4 months Wynetta Emery, Barb Merino, DO [employer], [facility]

## 2021-02-13 ENCOUNTER — Other Ambulatory Visit: Payer: Self-pay | Admitting: Family Medicine

## 2021-02-13 NOTE — Telephone Encounter (Signed)
Requested medication (s) are due for refill today: yes  Requested medication (s) are on the active medication list: yes  Last refill:  11/03/20 #60 1 refill  Future visit scheduled: yes in 1 month  Notes to clinic:  not delegated per protocol     Requested Prescriptions  Pending Prescriptions Disp Refills   clonazePAM (KLONOPIN) 0.5 MG tablet [Pharmacy Med Name: CLONAZEPAM 0.5 MG TABLET] 60 tablet 1    Sig: TAKE 1 TABLET BY MOUTH 2 TIMES DAILY AS NEEDED FOR ANXIETY.      Not Delegated - Psychiatry:  Anxiolytics/Hypnotics Failed - 02/13/2021  6:17 PM      Failed - This refill cannot be delegated      Passed - Urine Drug Screen completed in last 360 days      Passed - Valid encounter within last 6 months    Recent Outpatient Visits           3 months ago Mixed hyperlipidemia   [facility] [place], Megan P, DO   9 months ago Sinusitis, unspecified chronicity, unspecified location   [facility] Kathrine Haddock, NP   11 months ago Medial epicondylitis of left elbow   [place], [place], DO   12 months ago Mixed hyperlipidemia   [facility] [place], [place], DO   1 year ago Mild intermittent asthma with acute exacerbation   [facility] [place], [place]       Future Appointments             In 2 months Wynetta Emery, Barb Merino, DO [employer], [facility]

## 2021-02-14 NOTE — Telephone Encounter (Signed)
Pt has apt on 05/05/2021

## 2021-03-08 ENCOUNTER — Other Ambulatory Visit: Payer: Self-pay | Admitting: Family Medicine

## 2021-03-08 NOTE — Telephone Encounter (Signed)
Requested Prescriptions  Pending Prescriptions Disp Refills  . albuterol (VENTOLIN HFA) 108 (90 Base) MCG/ACT inhaler [Pharmacy Med Name: ALBUTEROL HFA (VENTOLIN) INH] 18 each 1    Sig: INHALE 2 PUFFS BY MOUTH EVERY 6 HOURS AS NEEDED FOR WHEEZE     Pulmonology:  Beta Agonists Failed - 03/08/2021  2:37 AM      Failed - One inhaler should last at least one month. If the patient is requesting refills earlier, contact the patient to check for uncontrolled symptoms.      Passed - Valid encounter within last 12 months    Recent Outpatient Visits          4 months ago Mixed hyperlipidemia   [facility] [place], Megan P, DO   10 months ago Sinusitis, unspecified chronicity, unspecified location   [place], NP   11 months ago Medial epicondylitis of left elbow   [place], [place], DO   1 year ago Mixed hyperlipidemia   [facility] [place], [place], DO   1 year ago Mild intermittent asthma with acute exacerbation   [facility] [place], [place]      Future Appointments            In 1 month Wynetta Emery, Barb Merino, DO [employer], [facility]

## 2021-03-30 ENCOUNTER — Ambulatory Visit: Payer: Self-pay | Admitting: *Deleted

## 2021-03-30 NOTE — Telephone Encounter (Signed)
Spoke with Patient, informed him of what Dr. Wynetta Emery stated in his call. He wanted to also know what type of medications he could take. He was informed that there are no medication to take if he was not symptomatic, and maybe able to take medications if he started to be symptomatic during the first 5 days. Patient was finally scheduled for a virtual visit with Dr. Wynetta Emery tomorrow at 1140 am.

## 2021-03-30 NOTE — Telephone Encounter (Signed)
Answer Assessment - Initial Assessment Questions 1. COVID-19 DIAGNOSIS: "Who made your COVID-19 diagnosis?" "Was it confirmed by a positive lab test or self-test?" If not diagnosed by a doctor (or NP/PA), ask "Are there lots of cases (community spread) where you live?" Note: See public health department website, if unsure.     *No Answer* 2. COVID-19 EXPOSURE: "Was there any known exposure to COVID before the symptoms began?" CDC Definition of close contact: within 6 feet (2 meters) for a total of 15 minutes or more over a 24-hour period.      *No Answer* 3. ONSET: "When did the COVID-19 symptoms start?"      *No Answer* 4. WORST SYMPTOM: "What is your worst symptom?" (e.g., cough, fever, shortness of breath, muscle aches)     *No Answer* 5. COUGH: "Do you have a cough?" If Yes, ask: "How bad is the cough?"       *No Answer* 6. FEVER: "Do you have a fever?" If Yes, ask: "What is your temperature, how was it measured, and when did it start?"     *No Answer* 7. RESPIRATORY STATUS: "Describe your breathing?" (e.g., shortness of breath, wheezing, unable to speak)      *No Answer* 8. BETTER-SAME-WORSE: "Are you getting better, staying the same or getting worse compared to yesterday?"  If getting worse, ask, "In what way?"     *No Answer* 9. HIGH RISK DISEASE: "Do you have any chronic medical problems?" (e.g., asthma, heart or lung disease, weak immune system, obesity, etc.)     *No Answer* 10. VACCINE: "Have you had the COVID-19 vaccine?" If Yes, ask: "Which one, how many shots, when did you get it?"       *No Answer* 11. BOOSTER: "Have you received your COVID-19 booster?" If Yes, ask: "Which one and when did you get it?"       *No Answer* 12. PREGNANCY: "Is there any chance you are pregnant?" "When was your last menstrual period?"       *No Answer* 13. OTHER SYMPTOMS: "Do you have any other symptoms?"  (e.g., chills, fatigue, headache, loss of smell or taste, muscle pain, sore throat)       *No  Answer* 14. O2 SATURATION MONITOR:  "Do you use an oxygen saturation monitor (pulse oximeter) at home?" If Yes, ask "What is your reading (oxygen level) today?" "What is your usual oxygen saturation reading?" (e.g., 95%)       *No Answer*  Protocols used: Coronavirus (COVID-19) Diagnosed or Suspected-A-AH

## 2021-03-30 NOTE — Telephone Encounter (Signed)
If he tested positive with a PCR it is more accurate than a home test. I can repeat it today if he'd like, but the results would likely not be back until Saturday. He can also go get it repeated with another PCR at [facility] etc- but I would not trust a rapid- would need a PCR to confirm

## 2021-03-30 NOTE — Telephone Encounter (Signed)
Routing to provider to advise.  

## 2021-03-30 NOTE — Telephone Encounter (Signed)
Pt is calling and had PCR covid test yesterday that was positive and took in home covid test today that came back negative . Please advise Pt is not having any symptoms he feels great. Pt is trying to go out of town   Info: PCR test: molecular test that finds genetic material from COVID virus- very sensitive and can detect very small amounts of virus Antigen test-home test- Less sensitive. Look for proteins of the COVID virus and they need a large amount of antigens to be present before they show positive test.  Attempted to call patient- left message to call office.

## 2021-03-30 NOTE — Telephone Encounter (Signed)
Pt reports tested positive yesterday, PCR. States did home test today, negative. No known exposure. States wears mask out anywhere, does not go to many outings. Denies any symptoms. States son, daughter and wife tested negative with PCR. Reviewed difference in accuracy, sensitivity of tests and incidences of false negatives. Pt questioning if he had covid in past 90 days could this be showing up now as positive and he would not be infectious.Has not had covid in past, no symptoms in past.  Pt is to leave tomorrow for family trip. Pt would like Dr. Durenda Age input. Assured pt NT would route to practice for PCPs review. CB# [phone] Reason for Disposition . COVID-19 Testing, questions about  Protocols used: Coronavirus ([id number]) Diagnosed or Suspected-A-AH

## 2021-03-31 ENCOUNTER — Encounter: Payer: Self-pay | Admitting: Family Medicine

## 2021-03-31 ENCOUNTER — Telehealth: Payer: 59 | Admitting: Family Medicine

## 2021-03-31 ENCOUNTER — Telehealth (INDEPENDENT_AMBULATORY_CARE_PROVIDER_SITE_OTHER): Payer: 59 | Admitting: Family Medicine

## 2021-03-31 DIAGNOSIS — F410 Panic disorder [episodic paroxysmal anxiety] without agoraphobia: Secondary | ICD-10-CM

## 2021-03-31 DIAGNOSIS — U071 COVID-19: Secondary | ICD-10-CM

## 2021-03-31 MED ORDER — CLONAZEPAM 0.5 MG PO TABS: 0.5000 mg | ORAL_TABLET | Freq: Two times a day (BID) | ORAL | 0 refills | Status: DC | PRN

## 2021-03-31 NOTE — Assessment & Plan Note (Signed)
Under good control on current regimen. Continue current regimen. Continue to monitor. Call with any concerns. Refills given- should last 3+ months.

## 2021-03-31 NOTE — Progress Notes (Signed)
There were no vitals taken for this visit.   Subjective:    Patient ID: Timothy Finley, male    DOB: 09/08/1970, 50 y.o.   MRN: [id number]  HPI: Timothy Finley is a 50 y.o. male  Chief Complaint  Patient presents with   Covid Positive    Tested positive with PCR, negative with home test, negative again with PCR, went today and did another PCR and antigen test, antigen test was negative. Still has no symptoms.   Anxiety    Would like a refill of his klonopin today   Tested positive with a PCR on Wed which was negative. Had a repeat Thursday which was negative and is awaiting another PCR today. Totally asymptomatic. No exposures. To be going to the beach with family and would like advice.   ANXIETY/STRESS Duration:stable Anxious mood: yes  Excessive worrying: no Irritability: yes  Sweating: no Nausea: no Palpitations:no Hyperventilation: no Panic attacks: no Agoraphobia: no  Obscessions/compulsions: no Depressed mood: no Depression screen [facility] 2/9 03/31/2021 11/03/2020 02/17/2020 09/17/2019 05/28/2019  Decreased Interest 0 0 0 0 0  Down, Depressed, Hopeless 0 0 0 0 0  PHQ - 2 Score 0 0 0 0 0  Altered sleeping 0 0 '1 1 2  ' Tired, decreased energy 0 0 0 0 1  Change in appetite 0 0 0 0 0  Feeling bad or failure about yourself  0 0 0 0 0  Trouble concentrating 0 0 0 0 0  Moving slowly or fidgety/restless 0 0 0 0 0  Suicidal thoughts 0 0 0 0 0  PHQ-9 Score 0 0 '1 1 3  ' Difficult doing work/chores Not difficult at all Not difficult at all Not difficult at all Not difficult at all Not difficult at all  Some recent data might be hidden   GAD 7 : Generalized Anxiety Score 03/31/2021 11/03/2020 11/03/2020 02/17/2020  Nervous, Anxious, on Edge '1 1 1 1  ' Control/stop worrying 1 0 0 0  Worry too much - different things 1 0 1 1  Trouble relaxing 0 1 0 0  Restless 0 0 0 0  Easily annoyed or irritable 1 0 1 1  Afraid - awful might happen 0 0 0 0  Total GAD 7 Score '4 2 3 3  ' Anxiety Difficulty  Not difficult at all Not difficult at all Not difficult at all Not difficult at all   Anhedonia: no Weight changes: no Insomnia: no   Hypersomnia: no Fatigue/loss of energy: no Feelings of worthlessness: no Feelings of guilt: no Impaired concentration/indecisiveness: no Suicidal ideations: no  Crying spells: no Recent Stressors/Life Changes: yes   Relationship problems: no   Family stress: no     Financial stress: no    Job stress: no    Recent death/loss: no  Relevant past medical, surgical, family and social history reviewed and updated as indicated. Interim medical history since our last visit reviewed. Allergies and medications reviewed and updated.  Review of Systems  Constitutional: Negative.   HENT: Negative.    Respiratory: Negative.    Cardiovascular: Negative.   Gastrointestinal: Negative.   Musculoskeletal: Negative.   Neurological: Negative.   Psychiatric/Behavioral: Negative.     Per HPI unless specifically indicated above     Objective:    There were no vitals taken for this visit.  Wt Readings from Last 3 Encounters:  11/03/20 164 lb (74.4 kg)  03/16/20 153 lb 6.4 oz (69.6 kg)  02/17/20 154 lb 9.6 oz (70.1 kg)  Physical Exam Vitals and nursing note reviewed.  Constitutional:      General: He is not in acute distress.    Appearance: Normal appearance. He is not ill-appearing, toxic-appearing or diaphoretic.  HENT:     Head: Normocephalic and atraumatic.     Right Ear: External ear normal.     Left Ear: External ear normal.     Nose: Nose normal.     Mouth/Throat:     Mouth: Mucous membranes are moist.     Pharynx: Oropharynx is clear.  Eyes:     General: No scleral icterus.       Right eye: No discharge.        Left eye: No discharge.     Extraocular Movements: Extraocular movements intact.     Conjunctiva/sclera: Conjunctivae normal.     Pupils: Pupils are equal, round, and reactive to light.  Cardiovascular:     Rate and Rhythm: Normal  rate and regular rhythm.     Pulses: Normal pulses.     Heart sounds: Normal heart sounds. No murmur heard.   No friction rub. No gallop.  Pulmonary:     Effort: Pulmonary effort is normal. No respiratory distress.     Breath sounds: Normal breath sounds. No stridor. No wheezing, rhonchi or rales.  Chest:     Chest wall: No tenderness.  Musculoskeletal:        General: Normal range of motion.     Cervical back: Normal range of motion and neck supple.  Skin:    General: Skin is warm and dry.     Capillary Refill: Capillary refill takes less than 2 seconds.     Coloration: Skin is not jaundiced or pale.     Findings: No bruising, erythema, lesion or rash.  Neurological:     General: No focal deficit present.     Mental Status: He is alert and oriented to person, place, and time. Mental status is at baseline.  Psychiatric:        Mood and Affect: Mood normal.        Behavior: Behavior normal.        Thought Content: Thought content normal.        Judgment: Judgment normal.    Results for orders placed or performed in visit on 11/03/20  Comprehensive metabolic panel  Result Value Ref Range   Glucose 97 65 - 99 mg/dL   BUN 9 6 - 24 mg/dL   Creatinine, Ser 0.85 0.76 - 1.27 mg/dL   eGFR 107 >59 mL/min/1.73   BUN/Creatinine Ratio 11 9 - 20   Sodium 142 134 - 144 mmol/L   Potassium 4.9 3.5 - 5.2 mmol/L   Chloride 101 96 - 106 mmol/L   CO2 22 20 - 29 mmol/L   Calcium 9.6 8.7 - 10.2 mg/dL   Total Protein 6.9 6.0 - 8.5 g/dL   Albumin 4.6 4.0 - 5.0 g/dL   Globulin, Total 2.3 1.5 - 4.5 g/dL   Albumin/Globulin Ratio 2.0 1.2 - 2.2   Bilirubin Total 0.3 0.0 - 1.2 mg/dL   Alkaline Phosphatase 119 44 - 121 IU/L   AST 37 0 - 40 IU/L   ALT 81 (H) 0 - 44 IU/L  Lipid Panel w/o Chol/HDL Ratio  Result Value Ref Range   Cholesterol, Total 180 100 - 199 mg/dL   Triglycerides 487 (H) 0 - 149 mg/dL   HDL 67 >39 mg/dL   VLDL Cholesterol Cal 69 (H) 5 - 40 mg/dL  LDL Chol Calc (NIH) 44 0 - 99  mg/dL  [id number] 11+Oxyco+Alc+Crt-Bund  Result Value Ref Range   Ethanol Negative Cutoff=0.020 %   Amphetamines, Urine Negative Cutoff=1000 ng/mL   Barbiturate Negative Cutoff=200 ng/mL   BENZODIAZ UR QL See Final Results Cutoff=200 ng/mL   Cannabinoid Quant, Ur Negative Cutoff=50 ng/mL   Cocaine (Metabolite) Negative Cutoff=300 ng/mL   OPIATE SCREEN URINE Negative Cutoff=300 ng/mL   Oxycodone/Oxymorphone, Urine Negative Cutoff=300 ng/mL   Phencyclidine Negative Cutoff=25 ng/mL   Methadone Screen, Urine Negative Cutoff=300 ng/mL   Propoxyphene Negative Cutoff=300 ng/mL   Meperidine Negative Cutoff=200 ng/mL   Tramadol Negative Cutoff=200 ng/mL   Creatinine 88.3 20.0 - 300.0 mg/dL   pH, Urine 6.2 4.5 - 8.9  Benzodiazepines Confirm, Urine  Result Value Ref Range   Benzodiazepines Positive (A) Cutoff=100 ng/mL   Nordiazepam Negative Cutoff=100   Oxazepam Negative Cutoff=100   Flurazepam Negative Cutoff=100   Lorazepam Negative Cutoff=100   Alprazolam Positive (A)    Alprazolam Conf. 166 Cutoff=100 ng/mL   Clonazepam Negative Cutoff=100   Temazepam Negative Cutoff=100   Triazolam Negative Cutoff=100   Midazolam Negative Cutoff=100      Assessment & Plan:   Problem List Items Addressed This Visit       Other   Panic disorder    Under good control on current regimen. Continue current regimen. Continue to monitor. Call with any concerns. Refills given- should last 3+ months.        Other Visit Diagnoses     Lab test positive for detection of COVID-19 virus    -  Primary   Likely false positive. Asymptomatic. Await 3rd PCR. Discussed wearing a mask to be extra safe. Continue to monitor.         Follow up plan: Return As scheduled.     This visit was completed via video visit through MyChart due to the restrictions of the COVID-19 pandemic. All issues as above were discussed and addressed. Physical exam was done as above through visual confirmation on video through  MyChart. If it was felt that the patient should be evaluated in the office, they were directed there. The patient verbally consented to this visit. Location of the patient: home Location of the provider: work Those involved with this call:  Provider: Park Liter, DO CMA: Frazier Butt, CMA Front Desk/Registration: Barth Kirks  Time spent on call:  15 minutes with patient face to face via video conference. More than 50% of this time was spent in counseling and coordination of care. 23 minutes total spent in review of patient's record and preparation of their chart.

## 2021-05-01 ENCOUNTER — Encounter: Payer: Self-pay | Admitting: Family Medicine

## 2021-05-01 ENCOUNTER — Other Ambulatory Visit: Payer: Self-pay | Admitting: Family Medicine

## 2021-05-02 ENCOUNTER — Other Ambulatory Visit: Payer: Self-pay | Admitting: Family Medicine

## 2021-05-02 NOTE — Telephone Encounter (Signed)
Requested medication (s) are due for refill today: Yes  Requested medication (s) are on the active medication list: Yes  Last refill:  03/31/21  Future visit scheduled: Yes  Notes to clinic:  See request.    Requested Prescriptions  Pending Prescriptions Disp Refills   clonazePAM (KLONOPIN) 0.5 MG tablet [Pharmacy Med Name: CLONAZEPAM 0.5 MG TABLET] 60 tablet 0    Sig: TAKE 1 TABLET BY MOUTH 2 TIMES DAILY AS NEEDED FOR ANXIETY.     Not Delegated - Psychiatry:  Anxiolytics/Hypnotics Failed - 05/01/2021  3:54 PM      Failed - This refill cannot be delegated      Passed - Urine Drug Screen completed in last 360 days      Passed - Valid encounter within last 6 months    Recent Outpatient Visits           1 month ago Lab test positive for detection of COVID-19 virus   [facility] [place], [place], DO   6 months ago Mixed hyperlipidemia   [facility] [place], Megan P, DO   1 year ago Sinusitis, unspecified chronicity, unspecified location   [place], NP   1 year ago Medial epicondylitis of left elbow   [place], [place], DO   1 year ago Mixed hyperlipidemia   [facility] [place], Barb Merino, DO       Future Appointments             In 3 days Wynetta Emery, Barb Merino, DO [employer]

## 2021-05-05 ENCOUNTER — Encounter: Payer: 59 | Admitting: Family Medicine

## 2021-05-10 ENCOUNTER — Other Ambulatory Visit: Payer: Self-pay | Admitting: Family Medicine

## 2021-05-10 NOTE — Telephone Encounter (Signed)
Requested Prescriptions  Pending Prescriptions Disp Refills  . fluticasone-salmeterol (ADVAIR DISKUS) 250-50 MCG/ACT AEPB [Pharmacy Med Name: ADVAIR 250-50 DISKUS] 180 each 1    Sig: INHALE 1 PUFF INTO THE LUNGS TWICE A DAY     Pulmonology:  Combination Products Passed - 05/10/2021  1:49 PM      Passed - Valid encounter within last 12 months    Recent Outpatient Visits          1 month ago Lab test positive for detection of COVID-19 virus   [facility] [place], [place], DO   6 months ago Mixed hyperlipidemia   [facility] [place], Megan P, DO   1 year ago Sinusitis, unspecified chronicity, unspecified location   [place], NP   1 year ago Medial epicondylitis of left elbow   [place], [place], DO   1 year ago Mixed hyperlipidemia   [facility] [place], [place], DO      Future Appointments            In 3 weeks Wynetta Emery, Barb Merino, DO [employer]

## 2021-05-24 ENCOUNTER — Other Ambulatory Visit: Payer: Self-pay | Admitting: Family Medicine

## 2021-05-24 NOTE — Telephone Encounter (Signed)
Requested Prescriptions  Pending Prescriptions Disp Refills  . albuterol (VENTOLIN HFA) 108 (90 Base) MCG/ACT inhaler [Pharmacy Med Name: ALBUTEROL HFA (VENTOLIN) INH] 18 each 1    Sig: INHALE 2 PUFFS BY MOUTH EVERY 6 HOURS AS NEEDED FOR WHEEZING     Pulmonology:  Beta Agonists Failed - 05/24/2021  1:25 AM      Failed - One inhaler should last at least one month. If the patient is requesting refills earlier, contact the patient to check for uncontrolled symptoms.      Passed - Valid encounter within last 12 months    Recent Outpatient Visits          1 month ago Lab test positive for detection of COVID-19 virus   [facility] [place], [place], DO   6 months ago Mixed hyperlipidemia   [facility] [place], [place], DO   1 year ago Sinusitis, unspecified chronicity, unspecified location   [place], NP   1 year ago Medial epicondylitis of left elbow   [place], [place], DO   1 year ago Mixed hyperlipidemia   [facility] [place], Barb Merino, DO      Future Appointments            In 1 week Wynetta Emery, Barb Merino, DO [employer]

## 2021-06-06 ENCOUNTER — Other Ambulatory Visit: Payer: Self-pay

## 2021-06-06 ENCOUNTER — Ambulatory Visit (INDEPENDENT_AMBULATORY_CARE_PROVIDER_SITE_OTHER): Payer: 59 | Admitting: Family Medicine

## 2021-06-06 ENCOUNTER — Encounter: Payer: Self-pay | Admitting: Family Medicine

## 2021-06-06 VITALS — BP 130/72 | HR 103 | Temp 97.9°F | Ht 68.5 in | Wt 157.6 lb

## 2021-06-06 DIAGNOSIS — F410 Panic disorder [episodic paroxysmal anxiety] without agoraphobia: Secondary | ICD-10-CM

## 2021-06-06 DIAGNOSIS — E782 Mixed hyperlipidemia: Secondary | ICD-10-CM | POA: Diagnosis not present

## 2021-06-06 DIAGNOSIS — Z1211 Encounter for screening for malignant neoplasm of colon: Secondary | ICD-10-CM

## 2021-06-06 DIAGNOSIS — F17200 Nicotine dependence, unspecified, uncomplicated: Secondary | ICD-10-CM | POA: Diagnosis not present

## 2021-06-06 DIAGNOSIS — Z0000 Encounter for general adult medical examination without abnormal findings: Secondary | ICD-10-CM

## 2021-06-06 LAB — URINALYSIS, ROUTINE W REFLEX MICROSCOPIC
Bilirubin, UA: NEGATIVE
Glucose, UA: NEGATIVE
Leukocytes,UA: NEGATIVE
Nitrite, UA: NEGATIVE
Protein,UA: NEGATIVE
RBC, UA: NEGATIVE
Specific Gravity, UA: 1.015 (ref 1.005–1.030)
Urobilinogen, Ur: 0.2 mg/dL (ref 0.2–1.0)
pH, UA: 5.5 (ref 5.0–7.5)

## 2021-06-06 MED ORDER — TRIAMCINOLONE ACETONIDE 0.5 % EX OINT: 1.0000 "application " | TOPICAL_OINTMENT | Freq: Two times a day (BID) | CUTANEOUS | 0 refills | Status: DC

## 2021-06-06 MED ORDER — CLONAZEPAM 0.5 MG PO TABS: 0.5000 mg | ORAL_TABLET | Freq: Two times a day (BID) | ORAL | 1 refills | Status: DC | PRN

## 2021-06-06 NOTE — Assessment & Plan Note (Signed)
Under good control on current regimen. Continue current regimen. Continue to monitor. Call with any concerns. Refills given.   

## 2021-06-06 NOTE — Assessment & Plan Note (Signed)
Under good control on current regimen. Continue current regimen. Continue to monitor. Call with any concerns. Refills given today. Rx should last 3-6 months. Call with any concerns.

## 2021-06-06 NOTE — Progress Notes (Signed)
BP 130/72   Pulse (!) 103   Temp 97.9 F (36.6 C)   Ht 5' 8.5" (1.74 m)   Wt 157 lb 9.6 oz (71.5 kg)   SpO2 97%   BMI 23.61 kg/m    Subjective:    Patient ID: Timothy Finley, male    DOB: 07-26-1971, 50 y.o.   MRN: [id number]  HPI: Timothy Finley is a 50 y.o. male presenting on 06/06/2021 for comprehensive medical examination. Current medical complaints include:  HYPERLIPIDEMIA Hyperlipidemia status: excellent compliance Satisfied with current treatment?  yes Side effects:  no Medication compliance: excellent compliance Past cholesterol meds: crestor Supplements: none Aspirin:  no The 10-year ASCVD risk score (Arnett DK, et al., 2019) is: 4.6%   Values used to calculate the score:     Age: 87 years     Sex: Male     Is Non-Hispanic African American: No     Diabetic: No     Tobacco smoker: Yes     Systolic Blood Pressure: 532 mmHg     Is BP treated: No     HDL Cholesterol: 67 mg/dL     Total Cholesterol: 180 mg/dL Chest pain:  no  ANXIETY/STRESS Duration: chronic Status:stable Anxious mood: yes  Excessive worrying: yes Irritability: no  Sweating: no Nausea: no Palpitations:no Hyperventilation: no Panic attacks: no Agoraphobia: no  Obscessions/compulsions: no Depressed mood: no Depression screen [facility] 2/9 06/06/2021 03/31/2021 11/03/2020 02/17/2020 09/17/2019  Decreased Interest 0 0 0 0 0  Down, Depressed, Hopeless 0 0 0 0 0  PHQ - 2 Score 0 0 0 0 0  Altered sleeping 0 0 0 1 1  Tired, decreased energy 0 0 0 0 0  Change in appetite 0 0 0 0 0  Feeling bad or failure about yourself  0 0 0 0 0  Trouble concentrating 0 0 0 0 0  Moving slowly or fidgety/restless 0 0 0 0 0  Suicidal thoughts 0 0 0 0 0  PHQ-9 Score 0 0 0 1 1  Difficult doing work/chores - Not difficult at all Not difficult at all Not difficult at all Not difficult at all  Some recent data might be hidden   Anhedonia: no Weight changes: no Insomnia: no   Hypersomnia: no Fatigue/loss of energy:  no Feelings of worthlessness: no Feelings of guilt: no Impaired concentration/indecisiveness: no Suicidal ideations: no  Crying spells: no Recent Stressors/Life Changes: no   Relationship problems: no   Family stress: no     Financial stress: no    Job stress: no    Recent death/loss: no  Interim Problems from his last visit: no  Depression Screen done today and results listed below:  Depression screen [facility] 2/9 06/06/2021 03/31/2021 11/03/2020 02/17/2020 09/17/2019  Decreased Interest 0 0 0 0 0  Down, Depressed, Hopeless 0 0 0 0 0  PHQ - 2 Score 0 0 0 0 0  Altered sleeping 0 0 0 1 1  Tired, decreased energy 0 0 0 0 0  Change in appetite 0 0 0 0 0  Feeling bad or failure about yourself  0 0 0 0 0  Trouble concentrating 0 0 0 0 0  Moving slowly or fidgety/restless 0 0 0 0 0  Suicidal thoughts 0 0 0 0 0  PHQ-9 Score 0 0 0 1 1  Difficult doing work/chores - Not difficult at all Not difficult at all Not difficult at all Not difficult at all  Some recent data might be hidden  Past Medical History:  Past Medical History:  Diagnosis Date   Anxiety    Kidney stones     Surgical History:  History reviewed. No pertinent surgical history.  Medications:  Current Outpatient Medications on File Prior to Visit  Medication Sig   albuterol (PROVENTIL) (2.5 MG/3ML) 0.083% nebulizer solution Take 3 mLs (2.5 mg total) by nebulization every 6 (six) hours as needed for wheezing or shortness of breath.   albuterol (VENTOLIN HFA) 108 (90 Base) MCG/ACT inhaler INHALE 2 PUFFS BY MOUTH EVERY 6 HOURS AS NEEDED FOR WHEEZING   aspirin EC 81 MG tablet Take 81 mg by mouth daily.    cetirizine (ZYRTEC) 10 MG tablet Take 10 mg by mouth daily.   fluticasone-salmeterol (ADVAIR DISKUS) 250-50 MCG/ACT AEPB INHALE 1 PUFF INTO THE LUNGS TWICE A DAY   rosuvastatin (CRESTOR) 40 MG tablet TAKE 1 TABLET BY MOUTH EVERY DAY   No current facility-administered medications on file prior to visit.    Allergies:  No  Known Allergies  Social History:  Social History   Socioeconomic History   Marital status: Married    Spouse name: Not on file   Number of children: Not on file   Years of education: Not on file   Highest education level: Not on file  Occupational History   Not on file  Tobacco Use   Smoking status: Every Day    Packs/day: 1.00    Types: Cigarettes   Smokeless tobacco: Former  Scientific laboratory technician Use: Never used  Substance and Sexual Activity   Alcohol use: Yes    Alcohol/week: 14.0 standard drinks    Types: 14 Cans of beer per week   Drug use: Yes    Types: Marijuana    Comment: maybe once or twice a month    Sexual activity: Yes    Birth control/protection: None  Other Topics Concern   Not on file  Social History Narrative   Not on file   Social Determinants of Health   Financial Resource Strain: Not on file  Food Insecurity: Not on file  Transportation Needs: Not on file  Physical Activity: Not on file  Stress: Not on file  Social Connections: Not on file  Intimate Partner Violence: Not on file   Social History   Tobacco Use  Smoking Status Every Day   Packs/day: 1.00   Types: Cigarettes  Smokeless Tobacco Former   Social History   Substance and Sexual Activity  Alcohol Use Yes   Alcohol/week: 14.0 standard drinks   Types: 14 Cans of beer per week    Family History:  Family History  Problem Relation Age of Onset   Cancer Maternal Grandmother        Breast   Cancer Maternal Grandfather    Aneurysm Paternal Grandfather        Brain    Past medical history, surgical history, medications, allergies, family history and social history reviewed with patient today and changes made to appropriate areas of the chart.   Review of Systems  Constitutional: Negative.   HENT: Negative.    Eyes: Negative.   Respiratory: Negative.    Cardiovascular: Negative.   Gastrointestinal:  Positive for heartburn. Negative for abdominal pain, blood in stool,  constipation, diarrhea, melena, nausea and vomiting.  Genitourinary: Negative.   Musculoskeletal: Negative.   Skin:  Positive for rash. Negative for itching.  Neurological: Negative.   Endo/Heme/Allergies: Negative.   Psychiatric/Behavioral: Negative.    All other ROS negative except  what is listed above and in the HPI.      Objective:    BP 130/72   Pulse (!) 103   Temp 97.9 F (36.6 C)   Ht 5' 8.5" (1.74 m)   Wt 157 lb 9.6 oz (71.5 kg)   SpO2 97%   BMI 23.61 kg/m   Wt Readings from Last 3 Encounters:  06/06/21 157 lb 9.6 oz (71.5 kg)  11/03/20 164 lb (74.4 kg)  03/16/20 153 lb 6.4 oz (69.6 kg)    Physical Exam Vitals and nursing note reviewed.  Constitutional:      General: He is not in acute distress.    Appearance: Normal appearance. He is not ill-appearing, toxic-appearing or diaphoretic.  HENT:     Head: Normocephalic and atraumatic.     Right Ear: Tympanic membrane, ear canal and external ear normal. There is no impacted cerumen.     Left Ear: Tympanic membrane, ear canal and external ear normal. There is no impacted cerumen.     Nose: Nose normal. No congestion or rhinorrhea.     Mouth/Throat:     Mouth: Mucous membranes are moist.     Pharynx: Oropharynx is clear. No oropharyngeal exudate or posterior oropharyngeal erythema.  Eyes:     General: No scleral icterus.       Right eye: No discharge.        Left eye: No discharge.     Extraocular Movements: Extraocular movements intact.     Conjunctiva/sclera: Conjunctivae normal.     Pupils: Pupils are equal, round, and reactive to light.  Neck:     Vascular: No carotid bruit.  Cardiovascular:     Rate and Rhythm: Normal rate and regular rhythm.     Pulses: Normal pulses.     Heart sounds: No murmur heard.   No friction rub. No gallop.  Pulmonary:     Effort: Pulmonary effort is normal. No respiratory distress.     Breath sounds: Normal breath sounds. No stridor. No wheezing, rhonchi or rales.  Chest:      Chest wall: No tenderness.  Abdominal:     General: Abdomen is flat. Bowel sounds are normal. There is no distension.     Palpations: Abdomen is soft. There is no mass.     Tenderness: There is no abdominal tenderness. There is no right CVA tenderness, left CVA tenderness, guarding or rebound.     Hernia: No hernia is present.  Genitourinary:    Comments: Genital exam deferred with shared decision making Musculoskeletal:        General: No swelling, tenderness, deformity or signs of injury.     Cervical back: Normal range of motion and neck supple. No rigidity. No muscular tenderness.     Right lower leg: No edema.     Left lower leg: No edema.  Lymphadenopathy:     Cervical: No cervical adenopathy.  Skin:    General: Skin is warm and dry.     Capillary Refill: Capillary refill takes less than 2 seconds.     Coloration: Skin is not jaundiced or pale.     Findings: Rash present. No bruising, erythema or lesion.  Neurological:     General: No focal deficit present.     Mental Status: He is alert and oriented to person, place, and time.     Cranial Nerves: No cranial nerve deficit.     Sensory: No sensory deficit.     Motor: No weakness.     Coordination:  Coordination normal.     Gait: Gait normal.     Deep Tendon Reflexes: Reflexes normal.  Psychiatric:        Mood and Affect: Mood normal.        Behavior: Behavior normal.        Thought Content: Thought content normal.        Judgment: Judgment normal.    Results for orders placed or performed in visit on 06/06/21  Urinalysis, Routine w reflex microscopic  Result Value Ref Range   Specific Gravity, UA 1.015 1.005 - 1.030   pH, UA 5.5 5.0 - 7.5   Color, UA Yellow Yellow   Appearance Ur Clear Clear   Leukocytes,UA Negative Negative   Protein,UA Negative Negative/Trace   Glucose, UA Negative Negative   Ketones, UA 1+ (A) Negative   RBC, UA Negative Negative   Bilirubin, UA Negative Negative   Urobilinogen, Ur 0.2 0.2 -  1.0 mg/dL   Nitrite, UA Negative Negative      Assessment & Plan:   Problem List Items Addressed This Visit       Other   CIGARETTE SMOKER   Relevant Orders   Urinalysis, Routine w reflex microscopic (Completed)   Panic disorder    Under good control on current regimen. Continue current regimen. Continue to monitor. Call with any concerns. Refills given today. Rx should last 3-6 months. Call with any concerns.         Hyperlipidemia    Under good control on current regimen. Continue current regimen. Continue to monitor. Call with any concerns. Refills given.        Relevant Orders   Comprehensive metabolic panel   Lipid Panel w/o Chol/HDL Ratio   Other Visit Diagnoses     Routine general medical examination at a health care facility    -  Primary   Vaccines up to date. Screening labs checked today. Cologuard ordered. Continue diet and exercise. Call with any concerns. Continue to monitor.    Relevant Orders   Comprehensive metabolic panel   CBC with Differential/Platelet   Lipid Panel w/o Chol/HDL Ratio   PSA   TSH   Urinalysis, Routine w reflex microscopic (Completed)   Screening for colon cancer       Cologuard ordered today.    Relevant Orders   Cologuard       LABORATORY TESTING:  Health maintenance labs ordered today as discussed above.   The natural history of prostate cancer and ongoing controversy regarding screening and potential treatment outcomes of prostate cancer has been discussed with the patient. The meaning of a false positive PSA and a false negative PSA has been discussed. He indicates understanding of the limitations of this screening test and wishes to proceed with screening PSA testing.   IMMUNIZATIONS:   - Tdap: Tetanus vaccination status reviewed: last tetanus booster within 10 years. - Influenza: Refused - Pneumovax: Up to date - Prevnar: Refused - COVID: Up to date  SCREENING: - Colonoscopy: Cologuard ordered today  Discussed with  patient purpose of the colonoscopy is to detect colon cancer at curable precancerous or early stages   PATIENT COUNSELING:    Sexuality: Discussed sexually transmitted diseases, partner selection, use of condoms, avoidance of unintended pregnancy  and contraceptive alternatives.   Advised to avoid cigarette smoking.  I discussed with the patient that most people either abstain from alcohol or drink within safe limits (<=14/week and <=4 drinks/occasion for males, <=7/weeks and <= 3 drinks/occasion for females) and that  the risk for alcohol disorders and other health effects rises proportionally with the number of drinks per week and how often a drinker exceeds daily limits.  Discussed cessation/primary prevention of drug use and availability of treatment for abuse.   Diet: Encouraged to adjust caloric intake to maintain  or achieve ideal body weight, to reduce intake of dietary saturated fat and total fat, to limit sodium intake by avoiding high sodium foods and not adding table salt, and to maintain adequate dietary potassium and calcium preferably from fresh fruits, vegetables, and low-fat dairy products.    stressed the importance of regular exercise  Injury prevention: Discussed safety belts, safety helmets, smoke detector, smoking near bedding or upholstery.   Dental health: Discussed importance of regular tooth brushing, flossing, and dental visits.   Follow up plan: NEXT PREVENTATIVE PHYSICAL DUE IN 1 YEAR. Return in about 6 months (around 12/04/2021).

## 2021-06-07 LAB — COMPREHENSIVE METABOLIC PANEL
ALT: 115 IU/L — ABNORMAL HIGH (ref 0–44)
AST: 57 IU/L — ABNORMAL HIGH (ref 0–40)
Albumin/Globulin Ratio: 1.9 (ref 1.2–2.2)
Albumin: 5 g/dL (ref 4.0–5.0)
Alkaline Phosphatase: 114 IU/L (ref 44–121)
BUN/Creatinine Ratio: 12 (ref 9–20)
BUN: 10 mg/dL (ref 6–24)
Bilirubin Total: 0.4 mg/dL (ref 0.0–1.2)
CO2: 24 mmol/L (ref 20–29)
Calcium: 9.9 mg/dL (ref 8.7–10.2)
Chloride: 98 mmol/L (ref 96–106)
Creatinine, Ser: 0.85 mg/dL (ref 0.76–1.27)
Globulin, Total: 2.6 g/dL (ref 1.5–4.5)
Glucose: 90 mg/dL (ref 70–99)
Potassium: 4.3 mmol/L (ref 3.5–5.2)
Sodium: 138 mmol/L (ref 134–144)
Total Protein: 7.6 g/dL (ref 6.0–8.5)
eGFR: 107 mL/min/{1.73_m2} (ref >59)

## 2021-06-07 LAB — CBC WITH DIFFERENTIAL/PLATELET
Basophils Absolute: 0.1 10*3/uL (ref 0.0–0.2)
Basos: 1 %
EOS (ABSOLUTE): 0.2 10*3/uL (ref 0.0–0.4)
Eos: 1 %
Hematocrit: 44.9 % (ref 37.5–51.0)
Hemoglobin: 15.6 g/dL (ref 13.0–17.7)
Immature Grans (Abs): 0.2 10*3/uL — ABNORMAL HIGH (ref 0.0–0.1)
Immature Granulocytes: 2 %
Lymphocytes Absolute: 2.7 10*3/uL (ref 0.7–3.1)
Lymphs: 21 %
MCH: 32.4 pg (ref 26.6–33.0)
MCHC: 34.7 g/dL (ref 31.5–35.7)
MCV: 93 fL (ref 79–97)
Monocytes Absolute: 1.2 10*3/uL — ABNORMAL HIGH (ref 0.1–0.9)
Monocytes: 9 %
Neutrophils Absolute: 8.4 10*3/uL — ABNORMAL HIGH (ref 1.4–7.0)
Neutrophils: 66 %
Platelets: 274 10*3/uL (ref 150–450)
RBC: 4.82 x10E6/uL (ref 4.14–5.80)
RDW: 12.8 % (ref 11.6–15.4)
WBC: 12.8 10*3/uL — ABNORMAL HIGH (ref 3.4–10.8)

## 2021-06-07 LAB — LIPID PANEL W/O CHOL/HDL RATIO
Cholesterol, Total: 186 mg/dL (ref 100–199)
HDL: 59 mg/dL (ref >39)
LDL Chol Calc (NIH): 68 mg/dL (ref 0–99)
Triglycerides: 382 mg/dL — ABNORMAL HIGH (ref 0–149)
VLDL Cholesterol Cal: 59 mg/dL — ABNORMAL HIGH (ref 5–40)

## 2021-06-07 LAB — PSA: Prostate Specific Ag, Serum: 1 ng/mL (ref 0.0–4.0)

## 2021-06-07 LAB — TSH: TSH: 0.981 u[IU]/mL (ref 0.450–4.500)

## 2021-07-20 ENCOUNTER — Other Ambulatory Visit: Payer: Self-pay | Admitting: Family Medicine

## 2021-07-20 NOTE — Telephone Encounter (Signed)
Requested Prescriptions  Pending Prescriptions Disp Refills   albuterol (VENTOLIN HFA) 108 (90 Base) MCG/ACT inhaler [Pharmacy Med Name: ALBUTEROL HFA (VENTOLIN) INH] 18 each 1    Sig: TAKE 2 PUFFS BY MOUTH EVERY 6 HOURS AS NEEDED FOR WHEEZE     Pulmonology:  Beta Agonists Failed - 07/20/2021  1:38 AM      Failed - One inhaler should last at least one month. If the patient is requesting refills earlier, contact the patient to check for uncontrolled symptoms.      Passed - Valid encounter within last 12 months    Recent Outpatient Visits          1 month ago Routine general medical examination at a health care facility   [facility], [place] P, DO   3 months ago Lab test positive for detection of COVID-19 virus   [place], [place], DO   8 months ago Mixed hyperlipidemia   [facility] [place], [place], DO   1 year ago Sinusitis, unspecified chronicity, unspecified location   [facility] Kathrine Haddock, NP   1 year ago Medial epicondylitis of left elbow   [facility] Valerie Roys, DO      Future Appointments            In 4 months Wynetta Emery, Barb Merino, DO [employer], [facility]

## 2021-09-03 ENCOUNTER — Other Ambulatory Visit: Payer: Self-pay | Admitting: Family Medicine

## 2021-09-04 NOTE — Telephone Encounter (Signed)
Had labs 11/22 Requested Prescriptions  Pending Prescriptions Disp Refills   rosuvastatin (CRESTOR) 40 MG tablet [Pharmacy Med Name: ROSUVASTATIN CALCIUM 40 MG TAB] 90 tablet 0    Sig: TAKE 1 TABLET BY MOUTH EVERY DAY     Cardiovascular:  Antilipid - Statins 2 Failed - 09/03/2021 12:52 AM      Failed - Lipid Panel in normal range within the last 12 months    Cholesterol, Total  Date Value Ref Range Status  06/06/2021 186 100 - 199 mg/dL Final   LDL Chol Calc (NIH)  Date Value Ref Range Status  06/06/2021 68 0 - 99 mg/dL Final   HDL  Date Value Ref Range Status  06/06/2021 59 >39 mg/dL Final   Triglycerides  Date Value Ref Range Status  06/06/2021 382 (H) 0 - 149 mg/dL Final         Passed - Cr in normal range and within 360 days    Creatinine  Date Value Ref Range Status  11/03/2020 88.3 20.0 - 300.0 mg/dL Final  08/16/2011 1.10 0.60 - 1.30 mg/dL Final   Creatinine, Ser  Date Value Ref Range Status  06/06/2021 0.85 0.76 - 1.27 mg/dL Final         Passed - Patient is not pregnant      Passed - Valid encounter within last 12 months    Recent Outpatient Visits          3 months ago Routine general medical examination at a health care facility   [facility], Megan P, DO   5 months ago Lab test positive for detection of COVID-19 virus   [place], [place], DO   10 months ago Mixed hyperlipidemia   [employer], Megan P, DO   1 year ago Sinusitis, unspecified chronicity, unspecified location   [facility] Kathrine Haddock, NP   1 year ago Medial epicondylitis of left elbow   [facility] [place], Barb Merino, DO      Future Appointments            In 3 months Wynetta Emery, Barb Merino, DO [employer], [facility]

## 2021-09-07 ENCOUNTER — Encounter: Payer: Self-pay | Admitting: Family Medicine

## 2021-09-07 NOTE — Telephone Encounter (Signed)
I have not gotten a list of what they do cover and wrote for albuterol- not ventolin. Please have them fill whatever is covered.

## 2021-09-14 NOTE — Telephone Encounter (Signed)
Patient has picked up this prescription on 2/10

## 2021-09-14 NOTE — Telephone Encounter (Signed)
Please see my message from before.

## 2021-10-28 ENCOUNTER — Other Ambulatory Visit: Payer: Self-pay | Admitting: Family Medicine

## 2021-10-30 ENCOUNTER — Other Ambulatory Visit: Payer: Self-pay | Admitting: Family Medicine

## 2021-10-30 ENCOUNTER — Ambulatory Visit: Payer: Self-pay | Admitting: Family Medicine

## 2021-10-30 NOTE — Telephone Encounter (Signed)
Requested Prescriptions  ?Pending Prescriptions Disp Refills  ?? albuterol (VENTOLIN HFA) 108 (90 Base) MCG/ACT inhaler [Pharmacy Med Name: ALBUTEROL HFA (VENTOLIN) INH] 18 each 1  ?  Sig: INHALE 2 PUFFS BY MOUTH EVERY 6 HOURS AS NEEDED FOR WHEEZE  ?  ? Pulmonology:  Beta Agonists 2 Passed - 10/28/2021  1:00 AM  ?  ?  Passed - Last BP in normal range  ?  BP Readings from Last 1 Encounters:  ?06/06/21 130/72  ?   ?  ?  Passed - Last Heart Rate in normal range  ?  Pulse Readings from Last 1 Encounters:  ?06/06/21 (!) 103  ?   ?  ?  Passed - Valid encounter within last 12 months  ?  Recent Outpatient Visits   ?      ? 4 months ago Routine general medical examination at a health care facility  ? [place], Megan P, DO  ? 7 months ago Lab test positive for detection of COVID-19 virus  ? [place] P, DO  ? 12 months ago Mixed hyperlipidemia  ? [place], DO  ? 1 year ago Sinusitis, unspecified chronicity, unspecified location  ? [facility] Kathrine Haddock, NP  ? 1 year ago Medial epicondylitis of left elbow  ? [place], [place] P, DO  ?  ?  ?Future Appointments   ?        ? In 1 week Wynetta Emery, Barb Merino, DO [place], [facility]  ? In 1 month [place], Barb Merino, DO [facility], [facility]  ?  ? ?  ?  ?  ? ? ?

## 2021-10-31 NOTE — Telephone Encounter (Signed)
Requested medication (s) are due for refill today: no ? ?Requested medication (s) are on the active medication list: yes ? ?Last refill:  yesterday ? ?Future visit scheduled: yes ? ?Notes to clinic:   ?Possible duplicate: Hover to review recent actions on this medication   ? Sig: N/A  ? Disp:  Not specified    Refills:  0    Not DAW   ? Start: 10/30/2021  ? Class: Normal  ? Last ordered: Yesterday by Valerie Roys, DO Last refill: 10/30/2021  ? Rx #: [id number]  ? Pharmacy comment: Alternative Requested:NOT COVERED.  ? ? ? ?  ?Requested Prescriptions  ?Pending Prescriptions Disp Refills  ? albuterol (VENTOLIN HFA) 108 (90 Base) MCG/ACT inhaler [Pharmacy Med Name: ALBUTEROL HFA 90 MCG INHALER]  0  ?  ? Pulmonology:  Beta Agonists 2 Passed - 10/30/2021 12:50 PM  ?  ?  Passed - Last BP in normal range  ?  BP Readings from Last 1 Encounters:  ?06/06/21 130/72  ?  ?  ?  ?  Passed - Last Heart Rate in normal range  ?  Pulse Readings from Last 1 Encounters:  ?06/06/21 (!) 103  ?  ?  ?  ?  Passed - Valid encounter within last 12 months  ?  Recent Outpatient Visits   ? ?      ? 4 months ago Routine general medical examination at a health care facility  ? [place], Megan P, DO  ? 7 months ago Lab test positive for detection of COVID-19 virus  ? [place] P, DO  ? 12 months ago Mixed hyperlipidemia  ? [place], DO  ? 1 year ago Sinusitis, unspecified chronicity, unspecified location  ? [facility] Kathrine Haddock, NP  ? 1 year ago Medial epicondylitis of left elbow  ? [place], [place] P, DO  ? ?  ?  ?Future Appointments   ? ?        ? In 6 days Valerie Roys, DO [place], [facility]  ? In 1 month Johnson, Barb Merino, DO [facility], [facility]  ? ?  ? ?  ?  ?  ? ?

## 2021-11-06 ENCOUNTER — Ambulatory Visit (INDEPENDENT_AMBULATORY_CARE_PROVIDER_SITE_OTHER): Payer: Managed Care, Other (non HMO) | Admitting: Family Medicine

## 2021-11-06 ENCOUNTER — Encounter: Payer: Self-pay | Admitting: Family Medicine

## 2021-11-06 VITALS — BP 142/89 | HR 100 | Temp 98.7°F | Wt 161.2 lb

## 2021-11-06 DIAGNOSIS — F410 Panic disorder [episodic paroxysmal anxiety] without agoraphobia: Secondary | ICD-10-CM

## 2021-11-06 DIAGNOSIS — M542 Cervicalgia: Secondary | ICD-10-CM | POA: Diagnosis not present

## 2021-11-06 DIAGNOSIS — F1010 Alcohol abuse, uncomplicated: Secondary | ICD-10-CM | POA: Diagnosis not present

## 2021-11-06 DIAGNOSIS — Z23 Encounter for immunization: Secondary | ICD-10-CM

## 2021-11-06 MED ORDER — BUSPIRONE HCL 5 MG PO TABS: 5.0000 mg | ORAL_TABLET | Freq: Two times a day (BID) | ORAL | 3 refills | Status: DC

## 2021-11-06 MED ORDER — CLONAZEPAM 0.5 MG PO TABS: 0.5000 mg | ORAL_TABLET | Freq: Two times a day (BID) | ORAL | 0 refills | Status: DC | PRN

## 2021-11-06 NOTE — Assessment & Plan Note (Signed)
Will try to get mood under better control to help him cut down. Call with any concerns.  ?

## 2021-11-06 NOTE — Progress Notes (Signed)
? ?BP (!) 142/89   Pulse 100   Temp 98.7 ?F (37.1 ?C)   Wt 161 lb 3.2 oz (73.1 kg)   SpO2 98%   BMI 24.15 kg/m?   ? ?Subjective:  ? ? Patient ID: Timothy Finley, male    DOB: [birth year], 51 y.o.   MRN: [id number] ? ?HPI: ?Timothy Finley is a 51 y.o. male ? ?Chief Complaint  ?Patient presents with  ? Panic Disorder  ?  Patient states since about Mid January he has been having more frequent panic attacks. Patient states daily he is struggling more with anxiety and has been drinking about 6-8 beers daily.   ? ?ANXIETY/STRESS- wellbutrin, celexa in the past with bad results.  ?Duration: chronic ?Status:exacerbated ?Anxious mood: yes  ?Excessive worrying: yes ?Irritability: yes  ?Sweating: no ?Nausea: no ?Palpitations:no ?Hyperventilation: no ?Panic attacks: yes ?Agoraphobia: yes  ?Obscessions/compulsions: yes ?Depressed mood: yes ? ?  11/06/2021  ? 11:28 AM 06/06/2021  ?  1:45 PM 03/31/2021  ? 11:37 AM 11/03/2020  ?  2:21 PM 02/17/2020  ?  9:57 AM  ?Depression screen PHQ 2/9  ?Decreased Interest 0 0 0 0 0  ?Down, Depressed, Hopeless 0 0 0 0 0  ?PHQ - 2 Score 0 0 0 0 0  ?Altered sleeping 1 0 0 0 1  ?Tired, decreased energy 1 0 0 0 0  ?Change in appetite 1 0 0 0 0  ?Feeling bad or failure about yourself  1 0 0 0 0  ?Trouble concentrating 0 0 0 0 0  ?Moving slowly or fidgety/restless 1 0 0 0 0  ?Suicidal thoughts 0 0 0 0 0  ?PHQ-9 Score 5 0 0 0 1  ?Difficult doing work/chores   Not difficult at all Not difficult at all Not difficult at all  ? ? ?  11/06/2021  ? 11:29 AM 06/06/2021  ?  1:44 PM 03/31/2021  ? 11:38 AM 11/03/2020  ?  2:21 PM  ?GAD 7 : Generalized Anxiety Score  ?Nervous, Anxious, on Edge _0 ?Control/stop worrying 1 0 1 0  ?Worry too much - different things _1 0  ?Trouble relaxing 2 0 0 1  ?Restless 1 0 0 0  ?Easily annoyed or irritable 0 0 1 0  ?Afraid - awful might happen 1 0 0 0  ?Total GAD 7 Score _2 ?Anxiety Difficulty Somewhat difficult Not difficult at all Not difficult at all Not difficult at  all  ? ?Anhedonia: no ?Weight changes: no ?Insomnia: no   ?Hypersomnia: no ?Fatigue/loss of energy: yes ?Feelings of worthlessness: yes ?Feelings of guilt: yes ?Impaired concentration/indecisiveness: no ?Suicidal ideations: no  ?Crying spells: no ?Recent Stressors/Life Changes: yes ?  Relationship problems: no ?  Family stress: no   ?  Financial stress: no  ?  Job stress: no  ?  Recent death/loss: no ? ?Has severe pain in the back of his neck when he has a panic attack that radiates up his head. This has concerned him.  ? ?Relevant past medical, surgical, family and social history reviewed and updated as indicated. Interim medical history since our last visit reviewed. ?Allergies and medications reviewed and updated. ? ?Review of Systems  ?Constitutional: Negative.   ?Respiratory: Negative.    ?Cardiovascular: Negative.   ?Musculoskeletal:  Positive for myalgias, neck pain and neck stiffness. Negative for arthralgias, back pain, gait problem and joint swelling.  ?Skin: Negative.   ?Neurological: Negative.   ?Psychiatric/Behavioral:  Positive for dysphoric mood. Negative for agitation, behavioral problems, confusion, decreased concentration, hallucinations, self-injury, sleep disturbance and suicidal ideas. The patient is nervous/anxious. The patient is not hyperactive.   ? ?Per HPI unless specifically indicated above ? ?   ?Objective:  ?  ?BP (!) 142/89   Pulse 100   Temp 98.7 ?F (37.1 ?C)   Wt 161 lb 3.2 oz (73.1 kg)   SpO2 98%   BMI 24.15 kg/m?   ?Wt Readings from Last 3 Encounters:  ?11/06/21 161 lb 3.2 oz (73.1 kg)  ?06/06/21 157 lb 9.6 oz (71.5 kg)  ?11/03/20 164 lb (74.4 kg)  ?  ?Physical Exam ? ?Results for orders placed or performed in visit on 06/06/21  ?Comprehensive metabolic panel  ?Result Value Ref Range  ? Glucose 90 70 - 99 mg/dL  ? BUN 10 6 - 24 mg/dL  ? Creatinine, Ser 0.85 0.76 - 1.27 mg/dL  ? eGFR 107 >59 mL/min/1.73  ? BUN/Creatinine Ratio 12 9 - 20  ? Sodium 138 134 - 144 mmol/L  ?  Potassium 4.3 3.5 - 5.2 mmol/L  ? Chloride 98 96 - 106 mmol/L  ? CO2 24 20 - 29 mmol/L  ? Calcium 9.9 8.7 - 10.2 mg/dL  ? Total Protein 7.6 6.0 - 8.5 g/dL  ? Albumin 5.0 4.0 - 5.0 g/dL  ? Globulin, Total 2.6 1.5 - 4.5 g/dL  ? Albumin/Globulin Ratio 1.9 1.2 - 2.2  ? Bilirubin Total 0.4 0.0 - 1.2 mg/dL  ? Alkaline Phosphatase 114 44 - 121 IU/L  ? AST 57 (H) 0 - 40 IU/L  ? ALT 115 (H) 0 - 44 IU/L  ?CBC with Differential/Platelet  ?Result Value Ref Range  ? WBC 12.8 (H) 3.4 - 10.8 x10E3/uL  ? RBC 4.82 4.14 - 5.80 x10E6/uL  ? Hemoglobin 15.6 13.0 - 17.7 g/dL  ? Hematocrit 44.9 37.5 - 51.0 %  ? MCV 93 79 - 97 fL  ? MCH 32.4 26.6 - 33.0 pg  ? MCHC 34.7 31.5 - 35.7 g/dL  ? RDW 12.8 11.6 - 15.4 %  ? Platelets 274 150 - 450 x10E3/uL  ? Neutrophils 66 Not Estab. %  ? Lymphs 21 Not Estab. %  ? Monocytes 9 Not Estab. %  ? Eos 1 Not Estab. %  ? Basos 1 Not Estab. %  ? Neutrophils Absolute 8.4 (H) 1.4 - 7.0 x10E3/uL  ? Lymphocytes Absolute 2.7 0.7 - 3.1 x10E3/uL  ? Monocytes Absolute 1.2 (H) 0.1 - 0.9 x10E3/uL  ? EOS (ABSOLUTE) 0.2 0.0 - 0.4 x10E3/uL  ? Basophils Absolute 0.1 0.0 - 0.2 x10E3/uL  ? Immature Granulocytes 2 Not Estab. %  ? Immature Grans (Abs) 0.2 (H) 0.0 - 0.1 x10E3/uL  ?Lipid Panel w/o Chol/HDL Ratio  ?Result Value Ref Range  ? Cholesterol, Total 186 100 - 199 mg/dL  ? Triglycerides 382 (H) 0 - 149 mg/dL  ? HDL 59 >39 mg/dL  ? VLDL Cholesterol Cal 59 (H) 5 - 40 mg/dL  ? LDL Chol Calc (NIH) 68 0 - 99 mg/dL  ?PSA  ?Result Value Ref Range  ? Prostate Specific Ag, Serum 1.0 0.0 - 4.0 ng/mL  ?TSH  ?Result Value Ref Range  ? TSH 0.981 0.450 - 4.500 uIU/mL  ?Urinalysis, Routine w reflex microscopic  ?Result Value Ref Range  ? Specific Gravity, UA 1.015 1.005 - 1.030  ? pH, UA 5.5 5.0 - 7.5  ? Color, UA Yellow Yellow  ? Appearance Ur Clear Clear  ? Leukocytes,UA Negative Negative  ?  Protein,UA Negative Negative/Trace  ? Glucose, UA Negative Negative  ? Ketones, UA 1+ (A) Negative  ? RBC, UA Negative Negative  ? Bilirubin,  UA Negative Negative  ? Urobilinogen, Ur 0.2 0.2 - 1.0 mg/dL  ? Nitrite, UA Negative Negative  ? ?   ?Assessment & Plan:  ? ?Problem List Items Addressed This Visit   ? ?  ? Other  ? Panic disorder  ?  In exacerbation. Does not want to do an antidepressant. Will start buspar scheduled BID and recheck 1 month. Information about SNRI given to consider. May also consider atypical at bedtime such as rexaulti. Call with any concerns.  ?  ?  ? Relevant Medications  ? busPIRone (BUSPAR) 5 MG tablet  ? Alcohol abuse - Primary  ?  Will try to get mood under better control to help him cut down. Call with any concerns.  ?  ?  ? ?Other Visit Diagnoses   ? ? Neck pain      ? Will check x-ray. Await results. Treat as needed.   ? Relevant Orders  ? DG Cervical Spine Complete  ? ?  ?  ? ?Follow up plan: ?Return in about 4 weeks (around 12/04/2021). ? ? ? ? ? ?

## 2021-11-06 NOTE — Assessment & Plan Note (Signed)
In exacerbation. Does not want to do an antidepressant. Will start buspar scheduled BID and recheck 1 month. Information about SNRI given to consider. May also consider atypical at bedtime such as rexaulti. Call with any concerns.  ?

## 2021-11-09 ENCOUNTER — Encounter: Payer: Self-pay | Admitting: Family Medicine

## 2021-11-29 ENCOUNTER — Other Ambulatory Visit: Payer: Self-pay | Admitting: Family Medicine

## 2021-11-29 NOTE — Telephone Encounter (Signed)
Requested Prescriptions  ?Pending Prescriptions Disp Refills  ?? busPIRone (BUSPAR) 5 MG tablet [Pharmacy Med Name: BUSPIRONE HCL 5 MG TABLET] 180 tablet 2  ?  Sig: TAKE 1 TABLET BY MOUTH TWICE A DAY  ?  ? Psychiatry: Anxiolytics/Hypnotics - Non-controlled Passed - 11/29/2021 11:33 AM  ?  ?  Passed - Valid encounter within last 12 months  ?  Recent Outpatient Visits   ?      ? 3 weeks ago Alcohol abuse  ? [place], DO  ? 5 months ago Routine general medical examination at a health care facility  ? [place], Megan P, DO  ? 8 months ago Lab test positive for detection of COVID-19 virus  ? [place], DO  ? 1 year ago Mixed hyperlipidemia  ? [place], DO  ? 1 year ago Sinusitis, unspecified chronicity, unspecified location  ? [facility] Kathrine Haddock, NP  ?  ?  ?Future Appointments   ?        ? In 5 days Valerie Roys, DO [place], [facility]  ? In 5 days Valerie Roys, DO [facility], [facility]  ?  ? ?  ?  ?  ? ? ?

## 2021-12-03 ENCOUNTER — Other Ambulatory Visit: Payer: Self-pay | Admitting: Family Medicine

## 2021-12-04 ENCOUNTER — Ambulatory Visit: Payer: 59 | Admitting: Family Medicine

## 2021-12-04 ENCOUNTER — Ambulatory Visit: Payer: Managed Care, Other (non HMO) | Admitting: Family Medicine

## 2021-12-04 NOTE — Telephone Encounter (Signed)
Requested Prescriptions  ?Pending Prescriptions Disp Refills  ?? WIXELA INHUB 250-50 MCG/ACT AEPB [Pharmacy Med Name: WIXELA 250-50 INHUB] 180 each 1  ?  Sig: INHALE 1 PUFF INTO THE LUNGS TWICE A DAY  ?  ? Pulmonology:  Combination Products Passed - 12/03/2021  3:32 AM  ?  ?  Passed - Valid encounter within last 12 months  ?  Recent Outpatient Visits   ?      ? 4 weeks ago Alcohol abuse  ? [place], DO  ? 6 months ago Routine general medical examination at a health care facility  ? [place], Megan P, DO  ? 8 months ago Lab test positive for detection of COVID-19 virus  ? [place], DO  ? 1 year ago Mixed hyperlipidemia  ? [place], DO  ? 1 year ago Sinusitis, unspecified chronicity, unspecified location  ? [facility] Kathrine Haddock, NP  ?  ?  ?Future Appointments   ?        ? In 1 week Wynetta Emery, Barb Merino, DO [place], [facility]  ?  ? ?  ?  ?  ? ? ?

## 2021-12-11 ENCOUNTER — Ambulatory Visit: Payer: Managed Care, Other (non HMO) | Admitting: Family Medicine

## 2021-12-12 ENCOUNTER — Other Ambulatory Visit: Payer: Self-pay | Admitting: Family Medicine

## 2021-12-13 NOTE — Telephone Encounter (Signed)
Requested Prescriptions  ?Pending Prescriptions Disp Refills  ?? rosuvastatin (CRESTOR) 40 MG tablet [Pharmacy Med Name: ROSUVASTATIN CALCIUM 40 MG TAB] 30 tablet 2  ?  Sig: TAKE 1 TABLET BY MOUTH EVERY DAY  ?  ? Cardiovascular:  Antilipid - Statins 2 Failed - 12/12/2021  2:14 AM  ?  ?  Failed - Lipid Panel in normal range within the last 12 months  ?  Cholesterol, Total  ?Date Value Ref Range Status  ?06/06/2021 186 100 - 199 mg/dL Final  ? ?LDL Chol Calc (NIH)  ?Date Value Ref Range Status  ?06/06/2021 68 0 - 99 mg/dL Final  ? ?HDL  ?Date Value Ref Range Status  ?06/06/2021 59 >39 mg/dL Final  ? ?Triglycerides  ?Date Value Ref Range Status  ?06/06/2021 382 (H) 0 - 149 mg/dL Final  ? ?  ?  ?  Passed - Cr in normal range and within 360 days  ?  Creatinine  ?Date Value Ref Range Status  ?11/03/2020 88.3 20.0 - 300.0 mg/dL Final  ?08/16/2011 1.10 0.60 - 1.30 mg/dL Final  ? ?Creatinine, Ser  ?Date Value Ref Range Status  ?06/06/2021 0.85 0.76 - 1.27 mg/dL Final  ?   ?  ?  Passed - Patient is not pregnant  ?  ?  Passed - Valid encounter within last 12 months  ?  Recent Outpatient Visits   ?      ? 1 month ago Alcohol abuse  ? [place], DO  ? 6 months ago Routine general medical examination at a health care facility  ? [place], Megan P, DO  ? 8 months ago Lab test positive for detection of COVID-19 virus  ? [place], DO  ? 1 year ago Mixed hyperlipidemia  ? [place], DO  ? 1 year ago Sinusitis, unspecified chronicity, unspecified location  ? [facility] Kathrine Haddock, NP  ?  ?  ?Future Appointments   ?        ? Tomorrow Wynetta Emery, Barb Merino, DO [facility], [facility]  ?  ? ?  ?  ?  ? ?

## 2021-12-14 ENCOUNTER — Encounter: Payer: Self-pay | Admitting: Family Medicine

## 2021-12-14 ENCOUNTER — Ambulatory Visit (INDEPENDENT_AMBULATORY_CARE_PROVIDER_SITE_OTHER): Payer: Commercial Managed Care - HMO | Admitting: Family Medicine

## 2021-12-14 VITALS — BP 132/78 | HR 87 | Temp 98.4°F | Wt 157.8 lb

## 2021-12-14 DIAGNOSIS — F410 Panic disorder [episodic paroxysmal anxiety] without agoraphobia: Secondary | ICD-10-CM | POA: Diagnosis not present

## 2021-12-14 MED ORDER — DULOXETINE HCL 20 MG PO CPEP: ORAL_CAPSULE | ORAL | 3 refills | Status: DC

## 2021-12-14 NOTE — Progress Notes (Signed)
 BP 132/78   Pulse 87   Temp 98.4 F (36.9 C)   Wt 157 lb 12.8 oz (71.6 kg)   SpO2 97%   BMI 23.64 kg/m    Subjective:    Patient ID: Timothy Finley, male    DOB: 10/23/1970, 51 y.o.   MRN: [id number]  HPI: Timothy Finley is a 51 y.o. male  Chief Complaint  Patient presents with   Anxiety    Patient states he had to cut buspar to 1/2 a pill BID due to a full dose causing more anxiety. Patient seems more anxious while driving.    ANXIETY/STRESS Duration: chronic Status:stable Anxious mood: yes  Excessive worrying: yes Irritability: no  Sweating: no Nausea: no Palpitations:no Hyperventilation: no Panic attacks: yes Agoraphobia: no  Obscessions/compulsions: no Depressed mood: yes    12/14/2021    1:10 PM 11/06/2021   11:28 AM 06/06/2021    1:45 PM 03/31/2021   11:37 AM 11/03/2020    2:21 PM  Depression screen PHQ 2/9  Decreased Interest 0 0 0 0 0  Down, Depressed, Hopeless 0 0 0 0 0  PHQ - 2 Score 0 0 0 0 0  Altered sleeping 1 1 0 0 0  Tired, decreased energy 0 1 0 0 0  Change in appetite 0 1 0 0 0  Feeling bad or failure about yourself  0 1 0 0 0  Trouble concentrating 0 0 0 0 0  Moving slowly or fidgety/restless 0 1 0 0 0  Suicidal thoughts 0 0 0 0 0  PHQ-9 Score 1 5 0 0 0  Difficult doing work/chores    Not difficult at all Not difficult at all      12/14/2021    1:10 PM 11/06/2021   11:29 AM 06/06/2021    1:44 PM 03/31/2021   11:38 AM  GAD 7 : Generalized Anxiety Score  Nervous, Anxious, on Edge 2 2 1 1  Control/stop worrying 1 1 0 1  Worry too much - different things 1 2 1 1  Trouble relaxing 0 2 0 0  Restless 0 1 0 0  Easily annoyed or irritable 0 0 0 1  Afraid - awful might happen 0 1 0 0  Total GAD 7 Score 4 9 2 4  Anxiety Difficulty Somewhat difficult Somewhat difficult Not difficult at all Not difficult at all   Anhedonia: no Weight changes: no Insomnia: no   Hypersomnia: no Fatigue/loss of energy: yes Feelings of worthlessness:  yes Feelings of guilt: yes Impaired concentration/indecisiveness: yes Suicidal ideations: no  Crying spells: no Recent Stressors/Life Changes: no   Relationship problems: no   Family stress: no     Financial stress: no    Job stress: no    Recent death/loss: no  Relevant past medical, surgical, family and social history reviewed and updated as indicated. Interim medical history since our last visit reviewed. Allergies and medications reviewed and updated.  Review of Systems  Constitutional: Negative.   Respiratory: Negative.    Cardiovascular: Negative.   Musculoskeletal: Negative.   Skin: Negative.   Psychiatric/Behavioral:  Positive for agitation and dysphoric mood. Negative for behavioral problems, confusion, decreased concentration, hallucinations, self-injury, sleep disturbance and suicidal ideas. The patient is nervous/anxious. The patient is not hyperactive.    Per HPI unless specifically indicated above     Objective:    BP 132/78   Pulse 87   Temp 98.4 F (36.9 C)   Wt 157 lb 12.8 oz (71.6   kg)   SpO2 97%   BMI 23.64 kg/m   Wt Readings from Last 3 Encounters:  12/14/21 157 lb 12.8 oz (71.6 kg)  11/06/21 161 lb 3.2 oz (73.1 kg)  06/06/21 157 lb 9.6 oz (71.5 kg)    Physical Exam Vitals and nursing note reviewed.  Constitutional:      General: He is not in acute distress.    Appearance: Normal appearance. He is not ill-appearing, toxic-appearing or diaphoretic.  HENT:     Head: Normocephalic and atraumatic.     Right Ear: External ear normal.     Left Ear: External ear normal.     Nose: Nose normal.     Mouth/Throat:     Mouth: Mucous membranes are moist.     Pharynx: Oropharynx is clear.  Eyes:     General: No scleral icterus.       Right eye: No discharge.        Left eye: No discharge.     Extraocular Movements: Extraocular movements intact.     Conjunctiva/sclera: Conjunctivae normal.     Pupils: Pupils are equal, round, and reactive to light.   Cardiovascular:     Rate and Rhythm: Normal rate and regular rhythm.     Pulses: Normal pulses.     Heart sounds: Normal heart sounds. No murmur heard.   No friction rub. No gallop.  Pulmonary:     Effort: Pulmonary effort is normal. No respiratory distress.     Breath sounds: Normal breath sounds. No stridor. No wheezing, rhonchi or rales.  Chest:     Chest wall: No tenderness.  Musculoskeletal:        General: Normal range of motion.     Cervical back: Normal range of motion and neck supple.  Skin:    General: Skin is warm and dry.     Capillary Refill: Capillary refill takes less than 2 seconds.     Coloration: Skin is not jaundiced or pale.     Findings: No bruising, erythema, lesion or rash.  Neurological:     General: No focal deficit present.     Mental Status: He is alert and oriented to person, place, and time. Mental status is at baseline.  Psychiatric:        Mood and Affect: Mood normal.        Behavior: Behavior normal.        Thought Content: Thought content normal.        Judgment: Judgment normal.    Results for orders placed or performed in visit on 06/06/21  Comprehensive metabolic panel  Result Value Ref Range   Glucose 90 70 - 99 mg/dL   BUN 10 6 - 24 mg/dL   Creatinine, Ser 0.85 0.76 - 1.27 mg/dL   eGFR 107 >59 mL/min/1.73   BUN/Creatinine Ratio 12 9 - 20   Sodium 138 134 - 144 mmol/L   Potassium 4.3 3.5 - 5.2 mmol/L   Chloride 98 96 - 106 mmol/L   CO2 24 20 - 29 mmol/L   Calcium 9.9 8.7 - 10.2 mg/dL   Total Protein 7.6 6.0 - 8.5 g/dL   Albumin 5.0 4.0 - 5.0 g/dL   Globulin, Total 2.6 1.5 - 4.5 g/dL   Albumin/Globulin Ratio 1.9 1.2 - 2.2   Bilirubin Total 0.4 0.0 - 1.2 mg/dL   Alkaline Phosphatase 114 44 - 121 IU/L   AST 57 (H) 0 - 40 IU/L   ALT 115 (H) 0 - 44 IU/L    CBC with Differential/Platelet  Result Value Ref Range   WBC 12.8 (H) 3.4 - 10.8 x10E3/uL   RBC 4.82 4.14 - 5.80 x10E6/uL   Hemoglobin 15.6 13.0 - 17.7 g/dL   Hematocrit 44.9  37.5 - 51.0 %   MCV 93 79 - 97 fL   MCH 32.4 26.6 - 33.0 pg   MCHC 34.7 31.5 - 35.7 g/dL   RDW 12.8 11.6 - 15.4 %   Platelets 274 150 - 450 x10E3/uL   Neutrophils 66 Not Estab. %   Lymphs 21 Not Estab. %   Monocytes 9 Not Estab. %   Eos 1 Not Estab. %   Basos 1 Not Estab. %   Neutrophils Absolute 8.4 (H) 1.4 - 7.0 x10E3/uL   Lymphocytes Absolute 2.7 0.7 - 3.1 x10E3/uL   Monocytes Absolute 1.2 (H) 0.1 - 0.9 x10E3/uL   EOS (ABSOLUTE) 0.2 0.0 - 0.4 x10E3/uL   Basophils Absolute 0.1 0.0 - 0.2 x10E3/uL   Immature Granulocytes 2 Not Estab. %   Immature Grans (Abs) 0.2 (H) 0.0 - 0.1 x10E3/uL  Lipid Panel w/o Chol/HDL Ratio  Result Value Ref Range   Cholesterol, Total 186 100 - 199 mg/dL   Triglycerides 382 (H) 0 - 149 mg/dL   HDL 59 >39 mg/dL   VLDL Cholesterol Cal 59 (H) 5 - 40 mg/dL   LDL Chol Calc (NIH) 68 0 - 99 mg/dL  PSA  Result Value Ref Range   Prostate Specific Ag, Serum 1.0 0.0 - 4.0 ng/mL  TSH  Result Value Ref Range   TSH 0.981 0.450 - 4.500 uIU/mL  Urinalysis, Routine w reflex microscopic  Result Value Ref Range   Specific Gravity, UA 1.015 1.005 - 1.030   pH, UA 5.5 5.0 - 7.5   Color, UA Yellow Yellow   Appearance Ur Clear Clear   Leukocytes,UA Negative Negative   Protein,UA Negative Negative/Trace   Glucose, UA Negative Negative   Ketones, UA 1+ (A) Negative   RBC, UA Negative Negative   Bilirubin, UA Negative Negative   Urobilinogen, Ur 0.2 0.2 - 1.0 mg/dL   Nitrite, UA Negative Negative      Assessment & Plan:   Problem List Items Addressed This Visit       Other   Panic disorder - Primary    Doing better on the buspar but not quite there. Will add Cymbalta and recheck 1 month. Call with any concerns. Continue buspar.        Relevant Medications   DULoxetine (CYMBALTA) 20 MG capsule     Follow up plan: Return in about 4 weeks (around 01/11/2022) for virtual OK.      

## 2021-12-14 NOTE — Assessment & Plan Note (Signed)
Doing better on the buspar but not quite there. Will add Cymbalta and recheck 1 month. Call with any concerns. Continue buspar.

## 2022-01-06 ENCOUNTER — Other Ambulatory Visit: Payer: Self-pay | Admitting: Family Medicine

## 2022-01-08 NOTE — Telephone Encounter (Signed)
Requested medication (s) are due for refill today: No  Requested medication (s) are on the active medication list: Yes  Last refill:  12/14/21  Future visit scheduled: Yes  Notes to clinic:  Pharmacy requests 90 day supply.    Requested Prescriptions  Pending Prescriptions Disp Refills   DULoxetine (CYMBALTA) 20 MG capsule [Pharmacy Med Name: DULOXETINE HCL DR 20 MG CAP] 90 capsule 2    Sig: Take take 1 pill every other for 1 week, then 1 pill daily     Psychiatry: Antidepressants - SNRI - duloxetine Passed - 01/06/2022  9:33 AM      Passed - Cr in normal range and within 360 days    Creatinine  Date Value Ref Range Status  11/03/2020 88.3 20.0 - 300.0 mg/dL Final  08/16/2011 1.10 0.60 - 1.30 mg/dL Final   Creatinine, Ser  Date Value Ref Range Status  06/06/2021 0.85 0.76 - 1.27 mg/dL Final         Passed - eGFR is 30 or above and within 360 days    EGFR (African American)  Date Value Ref Range Status  08/16/2011 >60 >44m/min Final   GFR calc Af Amer  Date Value Ref Range Status  02/17/2020 121 >59 mL/min/1.73 Final    Comment:    **[facility] currently reports eGFR in compliance with the current**   recommendations of the [employer] [facility] will   update reporting as new guidelines are published from the NKF-ASN   Task force.    EGFR (Non-African Amer.)  Date Value Ref Range Status  08/16/2011 >60 >660mmin Final    Comment:    eGFR values <6068min/1.73 m2 may be an indication of chronic kidney disease (CKD). Calculated eGFR, using the MRDR Study equation, is useful in  patients with stable renal function. The eGFR calculation will not be reliable in acutely ill patients when serum creatinine is changing rapidly. It is not useful in patients on dialysis. The eGFR calculation may not be applicable to patients at the low and high extremes of body sizes, pregnant women, and vegetarians. potassium - Slight hemolysis, interpret results with  -  caution.    GFR calc non Af Amer  Date Value Ref Range Status  02/17/2020 105 >59 mL/min/1.73 Final   eGFR  Date Value Ref Range Status  06/06/2021 107 >59 mL/min/1.73 Final         Passed - Completed PHQ-2 or PHQ-9 in the last 360 days      Passed - Last BP in normal range    BP Readings from Last 1 Encounters:  12/14/21 132/78         Passed - Valid encounter within last 6 months    Recent Outpatient Visits           3 weeks ago Panic disorder   [facility]   2 months ago Alcohol abuse   [facility]   7 months ago Routine general medical examination at a health care facility   [facility]   9 months ago Lab test positive for detection of COVID-19 virus   [facility]   1 year ago Mixed hyperlipidemia   [facility]       Future Appointments             In 3 days [facility]ynetta EmeryegBarb MerinoO [facility]

## 2022-01-11 ENCOUNTER — Encounter: Payer: Self-pay | Admitting: Family Medicine

## 2022-01-11 ENCOUNTER — Telehealth (INDEPENDENT_AMBULATORY_CARE_PROVIDER_SITE_OTHER): Payer: Commercial Managed Care - HMO | Admitting: Family Medicine

## 2022-01-11 DIAGNOSIS — F410 Panic disorder [episodic paroxysmal anxiety] without agoraphobia: Secondary | ICD-10-CM | POA: Diagnosis not present

## 2022-01-11 DIAGNOSIS — F1010 Alcohol abuse, uncomplicated: Secondary | ICD-10-CM | POA: Diagnosis not present

## 2022-01-11 MED ORDER — CLONAZEPAM 0.5 MG PO TABS
0.5000 mg | ORAL_TABLET | Freq: Two times a day (BID) | ORAL | 0 refills | Status: DC | PRN
Start: 2022-01-11 — End: 2022-04-27

## 2022-01-11 MED ORDER — NALTREXONE HCL 50 MG PO TABS: 25.0000 mg | ORAL_TABLET | ORAL | 3 refills | Status: DC

## 2022-01-11 NOTE — Progress Notes (Signed)
There were no vitals taken for this visit.   Subjective:    Patient ID: Timothy Finley, male    DOB: 10-08-1970, 51 y.o.   MRN: [id number]  HPI: Timothy Finley is a 51 y.o. male  Chief Complaint  Patient presents with   Panic Disorder    Patient states he has not started Cymbalta yet due to reading about side effects    ANXIETY/STRESS Duration: chronic Status:stable Anxious mood: yes  Excessive worrying: yes Irritability: yes  Sweating: no Nausea: no Palpitations:no Hyperventilation: no Panic attacks: no Agoraphobia: no  Obscessions/compulsions: yes Depressed mood: yes    01/11/2022   11:27 AM 12/14/2021    1:10 PM 11/06/2021   11:28 AM 06/06/2021    1:45 PM 03/31/2021   11:37 AM  Depression screen PHQ 2/9  Decreased Interest 0 0 0 0 0  Down, Depressed, Hopeless 1 0 0 0 0  PHQ - 2 Score 1 0 0 0 0  Altered sleeping _0 0 0  Tired, decreased energy 1 0 1 0 0  Change in appetite 0 0 1 0 0  Feeling bad or failure about yourself  1 0 1 0 0  Trouble concentrating 1 0 0 0 0  Moving slowly or fidgety/restless 0 0 1 0 0  Suicidal thoughts 0 0 0 0 0  PHQ-9 Score _1 0 0  Difficult doing work/chores     Not difficult at all      01/11/2022   11:28 AM 12/14/2021    1:10 PM 11/06/2021   11:29 AM 06/06/2021    1:44 PM  GAD 7 : Generalized Anxiety Score  Nervous, Anxious, on Edge _2 Control/stop worrying _3 0  Worry too much - different things _4 Trouble relaxing 1 0 2 0  Restless 0 0 1 0  Easily annoyed or irritable 0 0 0 0  Afraid - awful might happen 0 0 1 0  Total GAD 7 Score _5 Anxiety Difficulty Not difficult at all Somewhat difficult Somewhat difficult Not difficult at all   Anhedonia: no Weight changes: no Insomnia: yes   Hypersomnia: no Fatigue/loss of energy: yes Feelings of worthlessness: yes Feelings of guilt: yes Impaired concentration/indecisiveness: no Suicidal ideations: no  Crying spells: no Recent Stressors/Life  Changes: yes   Relationship problems: no   Family stress: no     Financial stress: no    Job stress: no    Recent death/loss: no  Relevant past medical, surgical, family and social history reviewed and updated as indicated. Interim medical history since our last visit reviewed. Allergies and medications reviewed and updated.  Review of Systems  Constitutional: Negative.   Respiratory: Negative.    Cardiovascular: Negative.   Gastrointestinal: Negative.   Neurological: Negative.   Psychiatric/Behavioral:  Positive for dysphoric mood. Negative for agitation, behavioral problems, confusion, decreased concentration, hallucinations, self-injury, sleep disturbance and suicidal ideas. The patient is nervous/anxious. The patient is not hyperactive.     Per HPI unless specifically indicated above     Objective:    There were no vitals taken for this visit.  Wt Readings from Last 3 Encounters:  12/14/21 157 lb 12.8 oz (71.6 kg)  11/06/21 161 lb 3.2 oz (73.1 kg)  06/06/21 157 lb 9.6 oz (71.5 kg)    Physical Exam Vitals and nursing note reviewed.  Constitutional:      General: He is not  in acute distress.    Appearance: Normal appearance. He is normal weight. He is not ill-appearing, toxic-appearing or diaphoretic.  HENT:     Head: Normocephalic and atraumatic.     Right Ear: External ear normal.     Left Ear: External ear normal.     Nose: Nose normal.     Mouth/Throat:     Mouth: Mucous membranes are moist.     Pharynx: Oropharynx is clear.  Eyes:     General: No scleral icterus.       Right eye: No discharge.        Left eye: No discharge.     Conjunctiva/sclera: Conjunctivae normal.     Pupils: Pupils are equal, round, and reactive to light.  Pulmonary:     Effort: Pulmonary effort is normal. No respiratory distress.     Comments: Speaking in full sentences Musculoskeletal:        General: Normal range of motion.     Cervical back: Normal range of motion.  Skin:     Coloration: Skin is not jaundiced or pale.     Findings: No bruising, erythema, lesion or rash.  Neurological:     Mental Status: He is alert and oriented to person, place, and time. Mental status is at baseline.  Psychiatric:        Mood and Affect: Mood normal.        Behavior: Behavior normal.        Thought Content: Thought content normal.        Judgment: Judgment normal.     Results for orders placed or performed in visit on 06/06/21  Comprehensive metabolic panel  Result Value Ref Range   Glucose 90 70 - 99 mg/dL   BUN 10 6 - 24 mg/dL   Creatinine, Ser 0.85 0.76 - 1.27 mg/dL   eGFR 107 >59 mL/min/1.73   BUN/Creatinine Ratio 12 9 - 20   Sodium 138 134 - 144 mmol/L   Potassium 4.3 3.5 - 5.2 mmol/L   Chloride 98 96 - 106 mmol/L   CO2 24 20 - 29 mmol/L   Calcium 9.9 8.7 - 10.2 mg/dL   Total Protein 7.6 6.0 - 8.5 g/dL   Albumin 5.0 4.0 - 5.0 g/dL   Globulin, Total 2.6 1.5 - 4.5 g/dL   Albumin/Globulin Ratio 1.9 1.2 - 2.2   Bilirubin Total 0.4 0.0 - 1.2 mg/dL   Alkaline Phosphatase 114 44 - 121 IU/L   AST 57 (H) 0 - 40 IU/L   ALT 115 (H) 0 - 44 IU/L  CBC with Differential/Platelet  Result Value Ref Range   WBC 12.8 (H) 3.4 - 10.8 x10E3/uL   RBC 4.82 4.14 - 5.80 x10E6/uL   Hemoglobin 15.6 13.0 - 17.7 g/dL   Hematocrit 44.9 37.5 - 51.0 %   MCV 93 79 - 97 fL   MCH 32.4 26.6 - 33.0 pg   MCHC 34.7 31.5 - 35.7 g/dL   RDW 12.8 11.6 - 15.4 %   Platelets 274 150 - 450 x10E3/uL   Neutrophils 66 Not Estab. %   Lymphs 21 Not Estab. %   Monocytes 9 Not Estab. %   Eos 1 Not Estab. %   Basos 1 Not Estab. %   Neutrophils Absolute 8.4 (H) 1.4 - 7.0 x10E3/uL   Lymphocytes Absolute 2.7 0.7 - 3.1 x10E3/uL   Monocytes Absolute 1.2 (H) 0.1 - 0.9 x10E3/uL   EOS (ABSOLUTE) 0.2 0.0 - 0.4 x10E3/uL   Basophils Absolute 0.1 0.0 -  0.2 x10E3/uL   Immature Granulocytes 2 Not Estab. %   Immature Grans (Abs) 0.2 (H) 0.0 - 0.1 x10E3/uL  Lipid Panel w/o Chol/HDL Ratio  Result Value Ref Range    Cholesterol, Total 186 100 - 199 mg/dL   Triglycerides 382 (H) 0 - 149 mg/dL   HDL 59 >39 mg/dL   VLDL Cholesterol Cal 59 (H) 5 - 40 mg/dL   LDL Chol Calc (NIH) 68 0 - 99 mg/dL  PSA  Result Value Ref Range   Prostate Specific Ag, Serum 1.0 0.0 - 4.0 ng/mL  TSH  Result Value Ref Range   TSH 0.981 0.450 - 4.500 uIU/mL  Urinalysis, Routine w reflex microscopic  Result Value Ref Range   Specific Gravity, UA 1.015 1.005 - 1.030   pH, UA 5.5 5.0 - 7.5   Color, UA Yellow Yellow   Appearance Ur Clear Clear   Leukocytes,UA Negative Negative   Protein,UA Negative Negative/Trace   Glucose, UA Negative Negative   Ketones, UA 1+ (A) Negative   RBC, UA Negative Negative   Bilirubin, UA Negative Negative   Urobilinogen, Ur 0.2 0.2 - 1.0 mg/dL   Nitrite, UA Negative Negative      Assessment & Plan:   Problem List Items Addressed This Visit       Other   Panic disorder - Primary    Seems to be triggered by his drinking. Will hold on cymbalta and try to help with cutting down on his drinking. Recheck in about a month. Call with any concerns.       Alcohol abuse    Will try him on naltrexone to help with cravings to help with mood. Recheck in about a month. Call with any concerns.         Follow up plan: Return in about 4 weeks (around 02/08/2022).    This visit was completed via video visit through MyChart due to the restrictions of the COVID-19 pandemic. All issues as above were discussed and addressed. Physical exam was done as above through visual confirmation on video through MyChart. If it was felt that the patient should be evaluated in the office, they were directed there. The patient verbally consented to this visit. Location of the patient: home Location of the provider: work Those involved with this call:  Provider: Park Liter, DO CMA: Louanna Raw, [place] Desk/Registration: [employer]  Time spent on call:  15 minutes with patient face to face via video  conference. More than 50% of this time was spent in counseling and coordination of care. 23 minutes total spent in review of patient's record and preparation of their chart.

## 2022-01-11 NOTE — Assessment & Plan Note (Signed)
Will try him on naltrexone to help with cravings to help with mood. Recheck in about a month. Call with any concerns.

## 2022-01-11 NOTE — Assessment & Plan Note (Signed)
Seems to be triggered by his drinking. Will hold on cymbalta and try to help with cutting down on his drinking. Recheck in about a month. Call with any concerns.

## 2022-01-11 NOTE — Progress Notes (Signed)
LVM asking patient to call back to schedule an appointment 

## 2022-01-12 NOTE — Progress Notes (Signed)
2nd attempt to reach patient.

## 2022-01-15 NOTE — Progress Notes (Signed)
3rd attempt to reach patient.

## 2022-03-11 ENCOUNTER — Other Ambulatory Visit: Payer: Self-pay | Admitting: Family Medicine

## 2022-03-12 NOTE — Telephone Encounter (Signed)
Requested Prescriptions  Pending Prescriptions Disp Refills  . rosuvastatin (CRESTOR) 40 MG tablet [Pharmacy Med Name: ROSUVASTATIN CALCIUM 40 MG TAB] 30 tablet 2    Sig: TAKE 1 TABLET BY MOUTH EVERY DAY     Cardiovascular:  Antilipid - Statins 2 Failed - 03/11/2022  1:34 AM      Failed - Lipid Panel in normal range within the last 12 months    Cholesterol, Total  Date Value Ref Range Status  06/06/2021 186 100 - 199 mg/dL Final   LDL Chol Calc (NIH)  Date Value Ref Range Status  06/06/2021 68 0 - 99 mg/dL Final   HDL  Date Value Ref Range Status  06/06/2021 59 >39 mg/dL Final   Triglycerides  Date Value Ref Range Status  06/06/2021 382 (H) 0 - 149 mg/dL Final         Passed - Cr in normal range and within 360 days    Creatinine  Date Value Ref Range Status  11/03/2020 88.3 20.0 - 300.0 mg/dL Final  08/16/2011 1.10 0.60 - 1.30 mg/dL Final   Creatinine, Ser  Date Value Ref Range Status  06/06/2021 0.85 0.76 - 1.27 mg/dL Final         Passed - Patient is not pregnant      Passed - Valid encounter within last 12 months    Recent Outpatient Visits          2 months ago Panic disorder   [place], Megan P, DO   2 months ago Panic disorder   [place], [place], DO   4 months ago Alcohol abuse   [employer], [place], DO   9 months ago Routine general medical examination at a health care facility   [facility], Megan P, DO   11 months ago Lab test positive for detection of COVID-19 virus   [place], [place], DO

## 2022-03-25 ENCOUNTER — Other Ambulatory Visit: Payer: Self-pay | Admitting: Family Medicine

## 2022-03-26 NOTE — Telephone Encounter (Signed)
Requested Prescriptions  Pending Prescriptions Disp Refills  . busPIRone (BUSPAR) 5 MG tablet [Pharmacy Med Name: BUSPIRONE HCL 5 MG TABLET] 60 tablet 2    Sig: TAKE 1 TABLET BY MOUTH TWICE A DAY     Psychiatry: Anxiolytics/Hypnotics - Non-controlled Passed - 03/25/2022  1:25 AM      Passed - Valid encounter within last 12 months    Recent Outpatient Visits          2 months ago Panic disorder   [place], Megan P, DO   3 months ago Panic disorder   [place], [place], DO   4 months ago Alcohol abuse   [place], DO   9 months ago Routine general medical examination at a health care facility   [facility], Richey P, DO   12 months ago Lab test positive for detection of COVID-19 virus   [place], [place], DO

## 2022-04-13 ENCOUNTER — Other Ambulatory Visit: Payer: Self-pay | Admitting: Family Medicine

## 2022-04-13 NOTE — Telephone Encounter (Signed)
Medication was discontinued 01/11/2022. Med not on med list. Requested Prescriptions  Pending Prescriptions Disp Refills  . DULoxetine (CYMBALTA) 20 MG capsule [Pharmacy Med Name: DULOXETINE HCL DR 20 MG CAP] 30 capsule 3    Sig: TAKE TAKE 1 PILL EVERY OTHER FOR 1 WEEK, THEN 1 PILL DAILY     Psychiatry: Antidepressants - SNRI - duloxetine Passed - 04/13/2022  2:31 AM      Passed - Cr in normal range and within 360 days    Creatinine  Date Value Ref Range Status  11/03/2020 88.3 20.0 - 300.0 mg/dL Final  08/16/2011 1.10 0.60 - 1.30 mg/dL Final   Creatinine, Ser  Date Value Ref Range Status  06/06/2021 0.85 0.76 - 1.27 mg/dL Final         Passed - eGFR is 30 or above and within 360 days    EGFR (African American)  Date Value Ref Range Status  08/16/2011 >60 >77m/min Final   GFR calc Af Amer  Date Value Ref Range Status  02/17/2020 121 >59 mL/min/1.73 Final    Comment:    **[facility] currently reports eGFR in compliance with the current**   recommendations of the [employer] [facility] will   update reporting as new guidelines are published from the NKF-ASN   Task force.    EGFR (Non-African Amer.)  Date Value Ref Range Status  08/16/2011 >60 >651mmin Final    Comment:    eGFR values <6020min/1.73 m2 may be an indication of chronic kidney disease (CKD). Calculated eGFR, using the MRDR Study equation, is useful in  patients with stable renal function. The eGFR calculation will not be reliable in acutely ill patients when serum creatinine is changing rapidly. It is not useful in patients on dialysis. The eGFR calculation may not be applicable to patients at the low and high extremes of body sizes, pregnant women, and vegetarians. potassium - Slight hemolysis, interpret results with  - caution.    GFR calc non Af Amer  Date Value Ref Range Status  02/17/2020 105 >59 mL/min/1.73 Final   eGFR  Date Value Ref Range Status  06/06/2021 107 >59 mL/min/1.73  Final         Passed - Completed PHQ-2 or PHQ-9 in the last 360 days      Passed - Last BP in normal range    BP Readings from Last 1 Encounters:  12/14/21 132/78         Passed - Valid encounter within last 6 months    Recent Outpatient Visits          3 months ago Panic disorder   [place], [place]   4 months ago Panic disorder   [place] [place]   5 months ago Alcohol abuse   [place], DO   10 months ago Routine general medical examination at a health care facility   [facility]   1 year ago Lab test positive for detection of COVID-19 virus   [place]

## 2022-04-22 ENCOUNTER — Telehealth: Payer: Self-pay | Admitting: Family Medicine

## 2022-04-23 NOTE — Telephone Encounter (Signed)
Requested Prescriptions  Pending Prescriptions Disp Refills  . busPIRone (BUSPAR) 5 MG tablet [Pharmacy Med Name: BUSPIRONE HCL 5 MG TABLET] 180 tablet 1    Sig: TAKE 1 TABLET BY MOUTH TWICE A DAY     Psychiatry: Anxiolytics/Hypnotics - Non-controlled Passed - 04/22/2022  2:31 PM      Passed - Valid encounter within last 12 months    Recent Outpatient Visits          3 months ago Panic disorder   [place], Megan P, DO   4 months ago Panic disorder   [place], [place], DO   5 months ago Alcohol abuse   [place], DO   10 months ago Routine general medical examination at a health care facility   [facility], [place], DO   1 year ago Lab test positive for detection of COVID-19 virus   [place], [place], DO

## 2022-04-24 NOTE — Telephone Encounter (Signed)
Patient states he didn't request a refill on the BUSPIRONE HCL 5 MG . He has 6 left right now

## 2022-04-25 NOTE — Telephone Encounter (Signed)
Attempted to contact patient NA unable to LVM. Called [facility] to have them get refill ready. Per pharmacist they will get it ready and notify patient.

## 2022-04-27 ENCOUNTER — Encounter: Payer: Self-pay | Admitting: Family Medicine

## 2022-04-27 ENCOUNTER — Ambulatory Visit (INDEPENDENT_AMBULATORY_CARE_PROVIDER_SITE_OTHER): Payer: Commercial Managed Care - HMO | Admitting: Family Medicine

## 2022-04-27 VITALS — BP 129/86 | HR 84 | Temp 97.9°F | Ht 68.5 in | Wt 155.2 lb

## 2022-04-27 DIAGNOSIS — K429 Umbilical hernia without obstruction or gangrene: Secondary | ICD-10-CM

## 2022-04-27 DIAGNOSIS — F410 Panic disorder [episodic paroxysmal anxiety] without agoraphobia: Secondary | ICD-10-CM | POA: Diagnosis not present

## 2022-04-27 DIAGNOSIS — K4090 Unilateral inguinal hernia, without obstruction or gangrene, not specified as recurrent: Secondary | ICD-10-CM | POA: Diagnosis not present

## 2022-04-27 DIAGNOSIS — Z23 Encounter for immunization: Secondary | ICD-10-CM

## 2022-04-27 MED ORDER — ROSUVASTATIN CALCIUM 40 MG PO TABS: 40.0000 mg | ORAL_TABLET | Freq: Every day | ORAL | 1 refills | Status: DC

## 2022-04-27 MED ORDER — BUSPIRONE HCL 5 MG PO TABS: 5.0000 mg | ORAL_TABLET | Freq: Two times a day (BID) | ORAL | 1 refills | Status: DC

## 2022-04-27 MED ORDER — CLONAZEPAM 0.5 MG PO TABS: 0.5000 mg | ORAL_TABLET | Freq: Two times a day (BID) | ORAL | 0 refills | Status: DC | PRN

## 2022-04-27 MED ORDER — ALBUTEROL SULFATE HFA 108 (90 BASE) MCG/ACT IN AERS: 1.0000 | INHALATION_SPRAY | Freq: Four times a day (QID) | RESPIRATORY_TRACT | 3 refills | Status: DC | PRN

## 2022-04-27 NOTE — Assessment & Plan Note (Signed)
Under good control on current regimen. Continue current regimen. Continue to monitor. Call with any concerns. Refills given. 60 pills should last 3 months.

## 2022-04-27 NOTE — Progress Notes (Signed)
BP 129/86   Pulse 84   Temp 97.9 F (36.6 C) (Oral)   Ht 5' 8.5" (1.74 m)   Wt 155 lb 3.2 oz (70.4 kg)   SpO2 97%   BMI 23.25 kg/m    Subjective:    Patient ID: Timothy Finley, male    DOB: 1971-03-29, 51 y.o.   MRN: [id number]  HPI: Timothy Finley is a 51 y.o. male  Chief Complaint  Patient presents with   shingles vacc    Patient is here for his 2nd vaccination   Hernia    Patient believes that he has a hernia    HERNIA Duration: chronic but worse in the past couple of weeks Location: left groin Painful: yes Discomfort: yes Bulge: no Quality:  aching and sore Onset: unknown Severity: moderate Context: not changing Aggravating factors: digging  ANXIETY/STRESS Duration: chronic Status:stable Anxious mood: yes  Excessive worrying: yes Irritability: yes  Sweating: no Nausea: no Palpitations:no Hyperventilation: no Panic attacks: yes Agoraphobia: no  Obscessions/compulsions: no Depressed mood: no    04/27/2022    8:41 AM 01/11/2022   11:27 AM 12/14/2021    1:10 PM 11/06/2021   11:28 AM 06/06/2021    1:45 PM  Depression screen PHQ 2/9  Decreased Interest 0 0 0 0 0  Down, Depressed, Hopeless 1 1 0 0 0  PHQ - 2 Score 1 1 0 0 0  Altered sleeping _0 0  Tired, decreased energy 1 1 0 1 0  Change in appetite 0 0 0 1 0  Feeling bad or failure about yourself  1 1 0 1 0  Trouble concentrating 0 1 0 0 0  Moving slowly or fidgety/restless 0 0 0 1 0  Suicidal thoughts 0 0 0 0 0  PHQ-9 Score _1 0  Difficult doing work/chores Somewhat difficult       Anhedonia: no Weight changes: no Insomnia: no   Hypersomnia: no Fatigue/loss of energy: no Feelings of worthlessness: no Feelings of guilt: no Impaired concentration/indecisiveness: no Suicidal ideations: no  Crying spells: no Recent Stressors/Life Changes: no   Relationship problems: no   Family stress: no     Financial stress: no    Job stress: no    Recent death/loss: no  Relevant past  medical, surgical, family and social history reviewed and updated as indicated. Interim medical history since our last visit reviewed. Allergies and medications reviewed and updated.  Review of Systems  Constitutional: Negative.   Respiratory: Negative.    Cardiovascular: Negative.   Gastrointestinal:  Positive for abdominal pain. Negative for abdominal distention, anal bleeding, blood in stool, constipation, diarrhea, nausea, rectal pain and vomiting.  Musculoskeletal: Negative.   Psychiatric/Behavioral: Negative.      Per HPI unless specifically indicated above     Objective:    BP 129/86   Pulse 84   Temp 97.9 F (36.6 C) (Oral)   Ht 5' 8.5" (1.74 m)   Wt 155 lb 3.2 oz (70.4 kg)   SpO2 97%   BMI 23.25 kg/m   Wt Readings from Last 3 Encounters:  04/27/22 155 lb 3.2 oz (70.4 kg)  12/14/21 157 lb 12.8 oz (71.6 kg)  11/06/21 161 lb 3.2 oz (73.1 kg)    Physical Exam Vitals and nursing note reviewed.  Constitutional:      General: He is not in acute distress.    Appearance: Normal appearance. He is not ill-appearing, toxic-appearing or diaphoretic.  HENT:  Head: Normocephalic and atraumatic.     Right Ear: External ear normal.     Left Ear: External ear normal.     Nose: Nose normal.     Mouth/Throat:     Mouth: Mucous membranes are moist.     Pharynx: Oropharynx is clear.  Eyes:     General: No scleral icterus.       Right eye: No discharge.        Left eye: No discharge.     Extraocular Movements: Extraocular movements intact.     Conjunctiva/sclera: Conjunctivae normal.     Pupils: Pupils are equal, round, and reactive to light.  Cardiovascular:     Rate and Rhythm: Normal rate and regular rhythm.     Pulses: Normal pulses.     Heart sounds: Normal heart sounds. No murmur heard.    No friction rub. No gallop.  Pulmonary:     Effort: Pulmonary effort is normal. No respiratory distress.     Breath sounds: Normal breath sounds. No stridor. No wheezing,  rhonchi or rales.  Chest:     Chest wall: No tenderness.  Abdominal:     General: Abdomen is flat. Bowel sounds are normal. There is no distension.     Palpations: Abdomen is soft. There is no mass.     Tenderness: There is no abdominal tenderness. There is no right CVA tenderness, left CVA tenderness, guarding or rebound.     Hernia: A hernia is present.     Comments: Smal umbilical hernia and L inguinal hernia  Musculoskeletal:        General: Normal range of motion.     Cervical back: Normal range of motion and neck supple.  Skin:    General: Skin is warm and dry.     Capillary Refill: Capillary refill takes less than 2 seconds.     Coloration: Skin is not jaundiced or pale.     Findings: No bruising, erythema, lesion or rash.  Neurological:     General: No focal deficit present.     Mental Status: He is alert and oriented to person, place, and time. Mental status is at baseline.  Psychiatric:        Mood and Affect: Mood normal.        Behavior: Behavior normal.        Thought Content: Thought content normal.        Judgment: Judgment normal.     Results for orders placed or performed in visit on 06/06/21  Comprehensive metabolic panel  Result Value Ref Range   Glucose 90 70 - 99 mg/dL   BUN 10 6 - 24 mg/dL   Creatinine, Ser 0.85 0.76 - 1.27 mg/dL   eGFR 107 >59 mL/min/1.73   BUN/Creatinine Ratio 12 9 - 20   Sodium 138 134 - 144 mmol/L   Potassium 4.3 3.5 - 5.2 mmol/L   Chloride 98 96 - 106 mmol/L   CO2 24 20 - 29 mmol/L   Calcium 9.9 8.7 - 10.2 mg/dL   Total Protein 7.6 6.0 - 8.5 g/dL   Albumin 5.0 4.0 - 5.0 g/dL   Globulin, Total 2.6 1.5 - 4.5 g/dL   Albumin/Globulin Ratio 1.9 1.2 - 2.2   Bilirubin Total 0.4 0.0 - 1.2 mg/dL   Alkaline Phosphatase 114 44 - 121 IU/L   AST 57 (H) 0 - 40 IU/L   ALT 115 (H) 0 - 44 IU/L  CBC with Differential/Platelet  Result Value Ref Range  WBC 12.8 (H) 3.4 - 10.8 x10E3/uL   RBC 4.82 4.14 - 5.80 x10E6/uL   Hemoglobin 15.6 13.0  - 17.7 g/dL   Hematocrit 44.9 37.5 - 51.0 %   MCV 93 79 - 97 fL   MCH 32.4 26.6 - 33.0 pg   MCHC 34.7 31.5 - 35.7 g/dL   RDW 12.8 11.6 - 15.4 %   Platelets 274 150 - 450 x10E3/uL   Neutrophils 66 Not Estab. %   Lymphs 21 Not Estab. %   Monocytes 9 Not Estab. %   Eos 1 Not Estab. %   Basos 1 Not Estab. %   Neutrophils Absolute 8.4 (H) 1.4 - 7.0 x10E3/uL   Lymphocytes Absolute 2.7 0.7 - 3.1 x10E3/uL   Monocytes Absolute 1.2 (H) 0.1 - 0.9 x10E3/uL   EOS (ABSOLUTE) 0.2 0.0 - 0.4 x10E3/uL   Basophils Absolute 0.1 0.0 - 0.2 x10E3/uL   Immature Granulocytes 2 Not Estab. %   Immature Grans (Abs) 0.2 (H) 0.0 - 0.1 x10E3/uL  Lipid Panel w/o Chol/HDL Ratio  Result Value Ref Range   Cholesterol, Total 186 100 - 199 mg/dL   Triglycerides 382 (H) 0 - 149 mg/dL   HDL 59 >39 mg/dL   VLDL Cholesterol Cal 59 (H) 5 - 40 mg/dL   LDL Chol Calc (NIH) 68 0 - 99 mg/dL  PSA  Result Value Ref Range   Prostate Specific Ag, Serum 1.0 0.0 - 4.0 ng/mL  TSH  Result Value Ref Range   TSH 0.981 0.450 - 4.500 uIU/mL  Urinalysis, Routine w reflex microscopic  Result Value Ref Range   Specific Gravity, UA 1.015 1.005 - 1.030   pH, UA 5.5 5.0 - 7.5   Color, UA Yellow Yellow   Appearance Ur Clear Clear   Leukocytes,UA Negative Negative   Protein,UA Negative Negative/Trace   Glucose, UA Negative Negative   Ketones, UA 1+ (A) Negative   RBC, UA Negative Negative   Bilirubin, UA Negative Negative   Urobilinogen, Ur 0.2 0.2 - 1.0 mg/dL   Nitrite, UA Negative Negative      Assessment & Plan:   Problem List Items Addressed This Visit       Other   Panic disorder    Under good control on current regimen. Continue current regimen. Continue to monitor. Call with any concerns. Refills given. 60 pills should last 3 months.        Relevant Medications   busPIRone (BUSPAR) 5 MG tablet   Other Visit Diagnoses     Umbilical hernia without obstruction and without gangrene    -  Primary   Will get him into  surgery for evaluation. Referral generated today.   Relevant Orders   Ambulatory referral to General Surgery   Non-recurrent unilateral inguinal hernia without obstruction or gangrene       Will get him into surgery for evaluation. Referral generated today.   Relevant Orders   Ambulatory referral to General Surgery   Need for shingles vaccine       Relevant Orders   Zoster Recombinant (Shingrix ) (Completed)        Follow up plan: Return in about 3 months (around 07/27/2022) for physical.

## 2022-05-10 ENCOUNTER — Other Ambulatory Visit: Payer: Self-pay

## 2022-05-10 ENCOUNTER — Ambulatory Visit: Payer: Commercial Managed Care - HMO | Admitting: Surgery

## 2022-05-10 ENCOUNTER — Ambulatory Visit: Payer: Self-pay | Admitting: Surgery

## 2022-05-10 ENCOUNTER — Encounter: Payer: Self-pay | Admitting: Surgery

## 2022-05-10 VITALS — BP 139/95 | HR 110 | Temp 98.4°F | Ht 68.0 in | Wt 155.0 lb

## 2022-05-10 DIAGNOSIS — K4020 Bilateral inguinal hernia, without obstruction or gangrene, not specified as recurrent: Secondary | ICD-10-CM

## 2022-05-10 NOTE — H&P (View-Only) (Signed)
Patient ID: Timothy Finley, male   DOB: 1971-05-23, 51 y.o.   MRN: [id number]  Chief Complaint: Umbilical hernia for 1 year  History of Present Illness Timothy Finley is a 51 y.o. male with a recent history of some excessive shoveling of old mulch.  With progressive development of paramedian abdominal epigastric soreness, along with bilateral groin soreness and discomfort.  No appreciable bulge present.  No remarkable exacerbation with coughing or sneezing.  Suspect hernias have been present for years, without affecting his work load.  Anticipating continued heavy work to come being short of employees.    Past Medical History Past Medical History:  Diagnosis Date   Anxiety    Kidney stones       History reviewed. No pertinent surgical history.  No Known Allergies  Current Outpatient Medications  Medication Sig Dispense Refill   albuterol (PROVENTIL) (2.5 MG/3ML) 0.083% nebulizer solution Take 3 mLs (2.5 mg total) by nebulization every 6 (six) hours as needed for wheezing or shortness of breath. 150 mL 1   albuterol (VENTOLIN HFA) 108 (90 Base) MCG/ACT inhaler Inhale 1-2 puffs into the lungs every 6 (six) hours as needed for wheezing or shortness of breath. 1 each 3   aspirin EC 81 MG tablet Take 81 mg by mouth daily.     busPIRone (BUSPAR) 5 MG tablet Take 1 tablet (5 mg total) by mouth 2 (two) times daily. 180 tablet 1   cetirizine (ZYRTEC) 10 MG tablet Take 10 mg by mouth daily.     clonazePAM (KLONOPIN) 0.5 MG tablet Take 1 tablet (0.5 mg total) by mouth 2 (two) times daily as needed for anxiety. 60 tablet 0   rosuvastatin (CRESTOR) 40 MG tablet Take 1 tablet (40 mg total) by mouth daily. 90 tablet 1   triamcinolone ointment (KENALOG) 0.5 % Apply 1 application topically 2 (two) times daily. 30 g 0   WIXELA INHUB 250-50 MCG/ACT AEPB INHALE 1 PUFF INTO THE LUNGS TWICE A DAY 180 each 1   No current facility-administered medications for this visit.    Family History Family History   Problem Relation Age of Onset   Cancer Maternal Grandmother        Breast   Cancer Maternal Grandfather    Aneurysm Paternal Grandfather        Brain      Social History Social History   Tobacco Use   Smoking status: Every Day    Packs/day: 1.00    Types: Cigarettes   Smokeless tobacco: Former  Scientific laboratory technician Use: Never used  Substance Use Topics   Alcohol use: Yes    Alcohol/week: 49.0 standard drinks of alcohol    Types: 42 Cans of beer, 7 Shots of liquor per week    Comment: 4-6 beers daily   Drug use: Yes    Types: Marijuana    Comment: maybe once or twice a month         Review of Systems  Constitutional: Negative.   HENT: Negative.    Eyes: Negative.   Respiratory: Negative.    Cardiovascular: Negative.   Gastrointestinal:  Positive for heartburn.  Genitourinary: Negative.   Skin: Negative.   Neurological: Negative.   Psychiatric/Behavioral: Negative.        Physical Exam Blood pressure (!) 139/95, pulse (!) 110, temperature 98.4 F (36.9 C), temperature source Oral, height '5\' 8"'$  (1.727 m), weight 155 lb (70.3 kg), SpO2 98 %. Last Weight  Most recent update: 05/10/2022  2:32  PM    Weight  70.3 kg (155 lb)             CONSTITUTIONAL: Well developed, and nourished, appropriately responsive and aware without distress.  EYES: Sclera non-icteric.   EARS, NOSE, MOUTH AND THROAT:  The oropharynx is clear. Oral mucosa is pink and moist.  Hearing is intact to voice.  NECK: Trachea is midline, and there is no jugular venous distension.  LYMPH NODES:  Lymph nodes in the neck are not appreciated. RESPIRATORY:  Lungs are clear, and breath sounds are equal bilaterally. Normal respiratory effort without pathologic use of accessory muscles. CARDIOVASCULAR: Heart is regular in rate and rhythm. GI: The abdomen is notable for diastases recti, even more prominent eventration just supraumbilical, however I cannot palpate a fascial defect at this area despite  his thin abdominal wall.  There is a pinpoint subcentimeter umbilical defect without umbilical hernia sac as the skin is immediately adjacent to this fascial defect.  Otherwise soft, nontender, and nondistended.  GU: Bilateral inguinal hernia sacs palpable on Valsalva.  Bilateral testes descended left elevated compared to right as expected, no other palpable abnormalities. MUSCULOSKELETAL:  Symmetrical muscle tone appreciated in all four extremities.    SKIN: Skin turgor is normal. No pathologic skin lesions appreciated.  NEUROLOGIC:  Motor and sensation appear grossly normal.  Cranial nerves are grossly without defect. PSYCH:  Alert and oriented to person, place and time. Affect is appropriate for situation.  Data Reviewed I have personally reviewed what is currently available of the patient's imaging, recent labs and medical records.   Labs:     Latest Ref Rng & Units 06/06/2021    2:07 PM 02/07/2018   10:58 AM 12/25/2016    8:42 AM  CBC  WBC 3.4 - 10.8 x10E3/uL 12.8  8.3  8.7   Hemoglobin 13.0 - 17.7 g/dL 15.6  15.8  15.7   Hematocrit 37.5 - 51.0 % 44.9  45.2  45.1   Platelets 150 - 450 x10E3/uL 274  285  284       Latest Ref Rng & Units 06/06/2021    2:07 PM 11/03/2020    2:12 PM 02/17/2020   10:00 AM  CMP  Glucose 70 - 99 mg/dL 90  97  90   BUN 6 - 24 mg/dL '10  9  7   '$ Creatinine 0.76 - 1.27 mg/dL 0.85  0.85  0.81   Sodium 134 - 144 mmol/L 138  142  141   Potassium 3.5 - 5.2 mmol/L 4.3  4.9  5.0   Chloride 96 - 106 mmol/L 98  101  101   CO2 20 - 29 mmol/L '24  22  25   '$ Calcium 8.7 - 10.2 mg/dL 9.9  9.6  9.8   Total Protein 6.0 - 8.5 g/dL 7.6  6.9  6.8   Total Bilirubin 0.0 - 1.2 mg/dL 0.4  0.3  <0.2   Alkaline Phos 44 - 121 IU/L 114  119  93   AST 0 - 40 IU/L 57  37  16   ALT 0 - 44 IU/L 115  81  25     Imaging:  Within last 24 hrs: No results found.  Assessment    Bilateral inguinal hernias Small umbilical fascial defect. Patient Active Problem List   Diagnosis Date  Noted   Alcohol abuse 11/06/2021   Hyperlipidemia 12/26/2016   Panic disorder 03/23/2016   Asthma 09/29/2010   CIGARETTE SMOKER 06/26/2010   Allergic rhinitis 06/26/2010  Plan    Robotic repair of bilateral inguinal hernias. Would continue observation of very small umbilical fascial defect.  I discussed possibility of incarceration, strangulation, enlargement in size over time, and the need for emergency surgery in the face of these.  Also reviewed the techniques of reduction should incarceration occur, and when unsuccessful to present to the ED.  Also discussed that surgery risks include recurrence which can be up to 30% in the case of complex hernias, use of prosthetic materials (mesh) and the increased risk of infection and the possible need for re-operation and removal of mesh, possibility of post-op SBO or ileus, and the risks of general anesthetic including heart attack, stroke, sudden death or some reaction to anesthetic medications. The patient, and those present, appear to understand the risks, any and all questions were answered to the patient's satisfaction.  No guarantees were ever expressed or implied.   Face-to-face time spent with the patient and accompanying care providers(if present) was 30 minutes, with more than 50% of the time spent counseling, educating, and coordinating care of the patient.    These notes generated with voice recognition software. I apologize for typographical errors.  Ronny Bacon M.D., FACS 05/10/2022, 3:02 PM

## 2022-05-10 NOTE — Patient Instructions (Signed)
Our surgery scheduler will call you within 24-48 hours to schedule your surgery. Please have the Blue surgery sheet available when speaking with her.   Umbilical Hernia, Adult  A hernia is a bulge of tissue that pushes through an opening between muscles. An umbilical hernia happens in the abdomen, near the belly button (umbilicus). The hernia may contain tissues from the small intestine, large intestine, or fatty tissue covering the intestines. Umbilical hernias in adults tend to get worse over time, and they require surgical treatment. There are different types of umbilical hernias, including: Indirect hernia. This type is located just above or below the umbilicus. It is the most common type of umbilical hernia in adults. Direct hernia. This type forms through an opening formed by the umbilicus. Reducible hernia. This type of hernia comes and goes. It may be visible only when you strain, lift something heavy, or cough. This type of hernia can be pushed back into the abdomen (reduced). Incarcerated hernia. This type traps abdominal tissue inside the hernia. This type of hernia cannot be reduced. Strangulated hernia. This type of hernia cuts off blood flow to the tissues inside the hernia. The tissues can start to die if this happens. This type of hernia requires emergency treatment. What are the causes? An umbilical hernia happens when tissue inside the abdomen presses on a weak area of the abdominal muscles. What increases the risk? You may have a greater risk of this condition if you: Are obese. Have had several pregnancies. Have a buildup of fluid inside your abdomen. Have had surgery that weakens the abdominal muscles. What are the signs or symptoms? The main symptom of this condition is a painless bulge at or near the belly button. A reducible hernia may be visible only when you strain, lift something heavy, or cough. Other symptoms may include: Dull pain. A feeling of pressure. Symptoms  of a strangulated hernia may include: Pain that gets increasingly worse. Nausea and vomiting. Pain when pressing on the hernia. Skin over the hernia becoming red or purple. Constipation. Blood in the stool. How is this diagnosed? This condition may be diagnosed based on: A physical exam. You may be asked to cough or strain while standing. These actions increase the pressure inside your abdomen and can force the hernia through the opening in your muscles. Your health care provider may try to reduce the hernia by pressing on it. Your symptoms and medical history. How is this treated? Surgery is the only treatment for an umbilical hernia. Surgery for a strangulated hernia is done as soon as possible. If you have a small hernia that is not incarcerated, you may need to lose weight before having surgery. Follow these instructions at home: Lose weight, if told by your health care provider. Do not try to push the hernia back in. Watch your hernia for any changes in color or size. Tell your health care provider if any changes occur. You may need to avoid activities that increase pressure on your hernia. Do not lift anything that is heavier than 10 lb (4.5 kg), or the limit that you are told, until your health care provider says that it is safe. Take over-the-counter and prescription medicines only as told by your health care provider. Keep all follow-up visits. This is important. Contact a health care provider if: Your hernia gets larger. Your hernia becomes painful. Get help right away if: You develop sudden, severe pain near the area of your hernia. You have pain as well as nausea   or vomiting. You have pain and the skin over your hernia changes color. You develop a fever or chills. Summary A hernia is a bulge of tissue that pushes through an opening between muscles. An umbilical hernia happens near the belly button. Surgery is the only treatment for an umbilical hernia. Do not try to push  your hernia back in. Keep all follow-up visits. This is important. This information is not intended to replace advice given to you by your health care provider. Make sure you discuss any questions you have with your health care provider. Document Revised: 02/22/2020 Document Reviewed: 02/22/2020 Elsevier Patient Education  2023 [employer] Inc[employer]  

## 2022-05-10 NOTE — Progress Notes (Signed)
Patient ID: Timothy Finley, male   DOB: 08-16-1970, 51 y.o.   MRN: [id number]  Chief Complaint: Umbilical hernia for 1 year  History of Present Illness Timothy Finley is a 51 y.o. male with a recent history of some excessive shoveling of old mulch.  With progressive development of paramedian abdominal epigastric soreness, along with bilateral groin soreness and discomfort.  No appreciable bulge present.  No remarkable exacerbation with coughing or sneezing.  Suspect hernias have been present for years, without affecting his work load.  Anticipating continued heavy work to come being short of employees.    Past Medical History Past Medical History:  Diagnosis Date   Anxiety    Kidney stones       History reviewed. No pertinent surgical history.  No Known Allergies  Current Outpatient Medications  Medication Sig Dispense Refill   albuterol (PROVENTIL) (2.5 MG/3ML) 0.083% nebulizer solution Take 3 mLs (2.5 mg total) by nebulization every 6 (six) hours as needed for wheezing or shortness of breath. 150 mL 1   albuterol (VENTOLIN HFA) 108 (90 Base) MCG/ACT inhaler Inhale 1-2 puffs into the lungs every 6 (six) hours as needed for wheezing or shortness of breath. 1 each 3   aspirin EC 81 MG tablet Take 81 mg by mouth daily.     busPIRone (BUSPAR) 5 MG tablet Take 1 tablet (5 mg total) by mouth 2 (two) times daily. 180 tablet 1   cetirizine (ZYRTEC) 10 MG tablet Take 10 mg by mouth daily.     clonazePAM (KLONOPIN) 0.5 MG tablet Take 1 tablet (0.5 mg total) by mouth 2 (two) times daily as needed for anxiety. 60 tablet 0   rosuvastatin (CRESTOR) 40 MG tablet Take 1 tablet (40 mg total) by mouth daily. 90 tablet 1   triamcinolone ointment (KENALOG) 0.5 % Apply 1 application topically 2 (two) times daily. 30 g 0   WIXELA INHUB 250-50 MCG/ACT AEPB INHALE 1 PUFF INTO THE LUNGS TWICE A DAY 180 each 1   No current facility-administered medications for this visit.    Family History Family History   Problem Relation Age of Onset   Cancer Maternal Grandmother        Breast   Cancer Maternal Grandfather    Aneurysm Paternal Grandfather        Brain      Social History Social History   Tobacco Use   Smoking status: Every Day    Packs/day: 1.00    Types: Cigarettes   Smokeless tobacco: Former  Scientific laboratory technician Use: Never used  Substance Use Topics   Alcohol use: Yes    Alcohol/week: 49.0 standard drinks of alcohol    Types: 42 Cans of beer, 7 Shots of liquor per week    Comment: 4-6 beers daily   Drug use: Yes    Types: Marijuana    Comment: maybe once or twice a month         Review of Systems  Constitutional: Negative.   HENT: Negative.    Eyes: Negative.   Respiratory: Negative.    Cardiovascular: Negative.   Gastrointestinal:  Positive for heartburn.  Genitourinary: Negative.   Skin: Negative.   Neurological: Negative.   Psychiatric/Behavioral: Negative.        Physical Exam Blood pressure (!) 139/95, pulse (!) 110, temperature 98.4 F (36.9 C), temperature source Oral, height '5\' 8"'$  (1.727 m), weight 155 lb (70.3 kg), SpO2 98 %. Last Weight  Most recent update: 05/10/2022  2:32  PM    Weight  70.3 kg (155 lb)             CONSTITUTIONAL: Well developed, and nourished, appropriately responsive and aware without distress.  EYES: Sclera non-icteric.   EARS, NOSE, MOUTH AND THROAT:  The oropharynx is clear. Oral mucosa is pink and moist.  Hearing is intact to voice.  NECK: Trachea is midline, and there is no jugular venous distension.  LYMPH NODES:  Lymph nodes in the neck are not appreciated. RESPIRATORY:  Lungs are clear, and breath sounds are equal bilaterally. Normal respiratory effort without pathologic use of accessory muscles. CARDIOVASCULAR: Heart is regular in rate and rhythm. GI: The abdomen is notable for diastases recti, even more prominent eventration just supraumbilical, however I cannot palpate a fascial defect at this area despite  his thin abdominal wall.  There is a pinpoint subcentimeter umbilical defect without umbilical hernia sac as the skin is immediately adjacent to this fascial defect.  Otherwise soft, nontender, and nondistended.  GU: Bilateral inguinal hernia sacs palpable on Valsalva.  Bilateral testes descended left elevated compared to right as expected, no other palpable abnormalities. MUSCULOSKELETAL:  Symmetrical muscle tone appreciated in all four extremities.    SKIN: Skin turgor is normal. No pathologic skin lesions appreciated.  NEUROLOGIC:  Motor and sensation appear grossly normal.  Cranial nerves are grossly without defect. PSYCH:  Alert and oriented to person, place and time. Affect is appropriate for situation.  Data Reviewed I have personally reviewed what is currently available of the patient's imaging, recent labs and medical records.   Labs:     Latest Ref Rng & Units 06/06/2021    2:07 PM 02/07/2018   10:58 AM 12/25/2016    8:42 AM  CBC  WBC 3.4 - 10.8 x10E3/uL 12.8  8.3  8.7   Hemoglobin 13.0 - 17.7 g/dL 15.6  15.8  15.7   Hematocrit 37.5 - 51.0 % 44.9  45.2  45.1   Platelets 150 - 450 x10E3/uL 274  285  284       Latest Ref Rng & Units 06/06/2021    2:07 PM 11/03/2020    2:12 PM 02/17/2020   10:00 AM  CMP  Glucose 70 - 99 mg/dL 90  97  90   BUN 6 - 24 mg/dL '10  9  7   '$ Creatinine 0.76 - 1.27 mg/dL 0.85  0.85  0.81   Sodium 134 - 144 mmol/L 138  142  141   Potassium 3.5 - 5.2 mmol/L 4.3  4.9  5.0   Chloride 96 - 106 mmol/L 98  101  101   CO2 20 - 29 mmol/L '24  22  25   '$ Calcium 8.7 - 10.2 mg/dL 9.9  9.6  9.8   Total Protein 6.0 - 8.5 g/dL 7.6  6.9  6.8   Total Bilirubin 0.0 - 1.2 mg/dL 0.4  0.3  <0.2   Alkaline Phos 44 - 121 IU/L 114  119  93   AST 0 - 40 IU/L 57  37  16   ALT 0 - 44 IU/L 115  81  25     Imaging:  Within last 24 hrs: No results found.  Assessment    Bilateral inguinal hernias Small umbilical fascial defect. Patient Active Problem List   Diagnosis Date  Noted   Alcohol abuse 11/06/2021   Hyperlipidemia 12/26/2016   Panic disorder 03/23/2016   Asthma 09/29/2010   CIGARETTE SMOKER 06/26/2010   Allergic rhinitis 06/26/2010  Plan    Robotic repair of bilateral inguinal hernias. Would continue observation of very small umbilical fascial defect.  I discussed possibility of incarceration, strangulation, enlargement in size over time, and the need for emergency surgery in the face of these.  Also reviewed the techniques of reduction should incarceration occur, and when unsuccessful to present to the ED.  Also discussed that surgery risks include recurrence which can be up to 30% in the case of complex hernias, use of prosthetic materials (mesh) and the increased risk of infection and the possible need for re-operation and removal of mesh, possibility of post-op SBO or ileus, and the risks of general anesthetic including heart attack, stroke, sudden death or some reaction to anesthetic medications. The patient, and those present, appear to understand the risks, any and all questions were answered to the patient's satisfaction.  No guarantees were ever expressed or implied.   Face-to-face time spent with the patient and accompanying care providers(if present) was 30 minutes, with more than 50% of the time spent counseling, educating, and coordinating care of the patient.    These notes generated with voice recognition software. I apologize for typographical errors.  Ronny Bacon M.D., FACS 05/10/2022, 3:02 PM

## 2022-05-14 ENCOUNTER — Telehealth: Payer: Self-pay | Admitting: Surgery

## 2022-05-14 NOTE — Telephone Encounter (Signed)
Patient calls back, he is now aware of all dates regarding his surgery and verbalized understanding. ?

## 2022-05-14 NOTE — Telephone Encounter (Signed)
Left message for patient to call, please inform him of the following regarding scheduled surgery.   Pre-Admission date/time, and Surgery date at [facility]  Surgery Date: 05/23/22 Preadmission Testing Date: 05/16/22 (phone 8a-1p)  Also patient will need to call at [phone], between 1-3:00pm the day before surgery, to find out what time to arrive for surgery.  '

## 2022-05-16 ENCOUNTER — Other Ambulatory Visit: Payer: Self-pay

## 2022-05-16 ENCOUNTER — Encounter
Admission: RE | Admit: 2022-05-16 | Discharge: 2022-05-16 | Disposition: A | Discharge status: Home / Self Care | Payer: Commercial Managed Care - HMO | Source: Ambulatory Visit | Attending: Surgery | Admitting: Surgery

## 2022-05-16 VITALS — Ht 68.5 in | Wt 155.0 lb

## 2022-05-16 DIAGNOSIS — E782 Mixed hyperlipidemia: Secondary | ICD-10-CM

## 2022-05-16 HISTORY — DX: Unspecified asthma, uncomplicated: J45.909

## 2022-05-16 NOTE — Patient Instructions (Addendum)
Your procedure is scheduled on: 05/23/22 Report to [place]. To find out your arrival time please call [phone] between 1PM - 3PM on 05/22/22.  Remember: Instructions that are not followed completely may result in serious medical risk, up to and including death, or upon the discretion of your surgeon and anesthesiologist your surgery may need to be rescheduled.     _X__ 1. Do not eat food or dronk any liquids after midnight the night before your procedure.                 No gum chewing or hard candies.   __X__2.  On the morning of surgery brush your teeth with toothpaste and water, you                 may rinse your mouth with mouthwash if you wish.  Do not swallow any              toothpaste of mouthwash.     _X__ 3.  No Alcohol for 24 hours before or after surgery.   _X__ 4.  Do Not Smoke or use e-cigarettes For 24 Hours Prior to Your Surgery.                 Do not use any chewable tobacco products for at least 6 hours prior to                 surgery.  ____  5.  Bring all medications with you on the day of surgery if instructed.   __X__  6.  Notify your doctor if there is any change in your medical condition      (cold, fever, infections).     Do not wear jewelry, make-up, hairpins, clips or nail polish. Do not wear lotions, powders, or perfumes. You may wear deodorant Do not shave body hair 48 hours prior to surgery. Men may shave face, neck or head. Do not bring valuables to the hospital.    [facility] is not responsible for any belongings or valuables.  Contacts, dentures/partials or body piercings may not be worn into surgery. Bring a case for your contacts, glasses or hearing aids, a denture cup will be supplied. Leave your suitcase in the car. After surgery it may be brought to your room. For patients admitted to the hospital, discharge time is determined by your treatment team.   Patients discharged the day  of surgery will not be allowed to drive home.    __X__ Take these medicines the morning of surgery with A SIP OF WATER:    1. busPIRone (BUSPAR) 5 MG tablet  2. rosuvastatin (CRESTOR) 40 MG tablet  3. clonazePAM (KLONOPIN) 0.5 MG tablet if needed  4.  5.  6.  ____ Fleet Enema (as directed)   ____ Use CHG Soap/SAGE wipes as directed  __X__ Use inhalers on the day of surgery uSE YOUR wIXELA INHALER AND DO A nEBULIZER TREATMENT PRIOR TO ARRIVAL  ____ Stop metformin/Janumet/Farxiga 2 days prior to surgery    ____ Take 1/2 of usual insulin dose the night before surgery. No insulin the morning          of surgery.   ____ Stop Blood Thinners Coumadin/Plavix/Xarelto/Pleta/Pradaxa/Eliquis/Effient/Aspirin  on   Or contact your Surgeon, Cardiologist or Medical Doctor regarding  ability to stop your blood thinners  __X__ Stop Anti-inflammatories 7 days before surgery such as Advil, Ibuprofen, Motrin,  BC or Goodies Powder,  Naprosyn, Naproxen, Aleve, Aspirin    __X__ Stop all herbals and supplements, fish oil or vitamins  for 7 days until after surgery.    ____ Bring C-Pap to the hospital.

## 2022-05-18 ENCOUNTER — Encounter
Admission: RE | Admit: 2022-05-18 | Discharge: 2022-05-18 | Disposition: A | Discharge status: Home / Self Care | Payer: Commercial Managed Care - HMO | Source: Ambulatory Visit | Attending: Surgery | Admitting: Surgery

## 2022-05-18 DIAGNOSIS — E782 Mixed hyperlipidemia: Secondary | ICD-10-CM | POA: Diagnosis not present

## 2022-05-18 DIAGNOSIS — Z01818 Encounter for other preprocedural examination: Secondary | ICD-10-CM | POA: Diagnosis not present

## 2022-05-18 DIAGNOSIS — K4020 Bilateral inguinal hernia, without obstruction or gangrene, not specified as recurrent: Secondary | ICD-10-CM | POA: Diagnosis not present

## 2022-05-18 LAB — COMPREHENSIVE METABOLIC PANEL
ALT: 50 U/L — ABNORMAL HIGH (ref 0–44)
AST: 29 U/L (ref 15–41)
Albumin: 4.3 g/dL (ref 3.5–5.0)
Alkaline Phosphatase: 81 U/L (ref 38–126)
Anion gap: 6 (ref 5–15)
BUN: 12 mg/dL (ref 6–20)
CO2: 27 mmol/L (ref 22–32)
Calcium: 9.7 mg/dL (ref 8.9–10.3)
Chloride: 105 mmol/L (ref 98–111)
Creatinine, Ser: 0.87 mg/dL (ref 0.61–1.24)
GFR, Estimated: >60 mL/min (ref >60)
Glucose, Bld: 116 mg/dL — ABNORMAL HIGH (ref 70–99)
Potassium: 4.3 mmol/L (ref 3.5–5.1)
Sodium: 138 mmol/L (ref 135–145)
Total Bilirubin: 0.9 mg/dL (ref 0.3–1.2)
Total Protein: 7.5 g/dL (ref 6.5–8.1)

## 2022-05-18 LAB — CBC WITH DIFFERENTIAL/PLATELET
Abs Immature Granulocytes: 0.18 10*3/uL — ABNORMAL HIGH (ref 0.00–0.07)
Basophils Absolute: 0.1 10*3/uL (ref 0.0–0.1)
Basophils Relative: 1 %
Eosinophils Absolute: 0.2 10*3/uL (ref 0.0–0.5)
Eosinophils Relative: 2 %
HCT: 43.3 % (ref 39.0–52.0)
Hemoglobin: 14.8 g/dL (ref 13.0–17.0)
Immature Granulocytes: 2 %
Lymphocytes Relative: 29 %
Lymphs Abs: 3 10*3/uL (ref 0.7–4.0)
MCH: 32.5 pg (ref 26.0–34.0)
MCHC: 34.2 g/dL (ref 30.0–36.0)
MCV: 95.2 fL (ref 80.0–100.0)
Monocytes Absolute: 1.1 10*3/uL — ABNORMAL HIGH (ref 0.1–1.0)
Monocytes Relative: 10 %
Neutro Abs: 5.9 10*3/uL (ref 1.7–7.7)
Neutrophils Relative %: 56 %
Platelets: 277 10*3/uL (ref 150–400)
RBC: 4.55 MIL/uL (ref 4.22–5.81)
RDW: 12.3 % (ref 11.5–15.5)
WBC: 10.4 10*3/uL (ref 4.0–10.5)
nRBC: 0 % (ref 0.0–0.2)

## 2022-05-23 ENCOUNTER — Ambulatory Visit: Payer: Commercial Managed Care - HMO

## 2022-05-23 ENCOUNTER — Other Ambulatory Visit: Payer: Self-pay

## 2022-05-23 ENCOUNTER — Encounter
Admission: RE | Disposition: A | Discharge status: Home / Self Care | Payer: Self-pay | Source: Ambulatory Visit | Attending: Surgery

## 2022-05-23 ENCOUNTER — Ambulatory Visit
Admission: RE | Admit: 2022-05-23 | Discharge: 2022-05-23 | Disposition: A | Discharge status: Home / Self Care | Payer: Commercial Managed Care - HMO | Source: Ambulatory Visit | Attending: Surgery | Admitting: Surgery

## 2022-05-23 ENCOUNTER — Encounter: Payer: Self-pay | Admitting: Surgery

## 2022-05-23 DIAGNOSIS — F17210 Nicotine dependence, cigarettes, uncomplicated: Secondary | ICD-10-CM | POA: Diagnosis not present

## 2022-05-23 DIAGNOSIS — K429 Umbilical hernia without obstruction or gangrene: Secondary | ICD-10-CM | POA: Diagnosis present

## 2022-05-23 DIAGNOSIS — K4020 Bilateral inguinal hernia, without obstruction or gangrene, not specified as recurrent: Secondary | ICD-10-CM | POA: Insufficient documentation

## 2022-05-23 DIAGNOSIS — F419 Anxiety disorder, unspecified: Secondary | ICD-10-CM | POA: Insufficient documentation

## 2022-05-23 DIAGNOSIS — J45909 Unspecified asthma, uncomplicated: Secondary | ICD-10-CM | POA: Insufficient documentation

## 2022-05-23 HISTORY — PX: INSERTION OF MESH: SHX5868

## 2022-05-23 SURGERY — REPAIR, HERNIA, INGUINAL, BILATERAL, ROBOT-ASSISTED
Anesthesia: General | Laterality: Bilateral

## 2022-05-23 MED ORDER — BUPIVACAINE-EPINEPHRINE (PF) 0.25% -1:200000 IJ SOLN
INTRAMUSCULAR | Status: DC | PRN
  Administered 2022-05-23: 30 mL

## 2022-05-23 MED ORDER — KETAMINE HCL 50 MG/5ML IJ SOSY
PREFILLED_SYRINGE | INTRAMUSCULAR | Status: AC
  Filled 2022-05-23: qty 5

## 2022-05-23 MED ORDER — BUPIVACAINE LIPOSOME 1.3 % IJ SUSP: 20.0000 mL | Freq: Once | INTRAMUSCULAR | Status: DC

## 2022-05-23 MED ORDER — CHLORHEXIDINE GLUCONATE 0.12 % MT SOLN: 15.0000 mL | Freq: Once | OROMUCOSAL | Status: AC

## 2022-05-23 MED ORDER — CHLORHEXIDINE GLUCONATE CLOTH 2 % EX PADS
6.0000 | MEDICATED_PAD | Freq: Once | CUTANEOUS | Status: AC
  Administered 2022-05-23: 6 via TOPICAL

## 2022-05-23 MED ORDER — CHLORHEXIDINE GLUCONATE 0.12 % MT SOLN
OROMUCOSAL | Status: AC
  Administered 2022-05-23: 15 mL via OROMUCOSAL
  Filled 2022-05-23: qty 15

## 2022-05-23 MED ORDER — CEFAZOLIN SODIUM-DEXTROSE 2-4 GM/100ML-% IV SOLN
INTRAVENOUS | Status: AC
  Filled 2022-05-23: qty 100

## 2022-05-23 MED ORDER — ROCURONIUM BROMIDE 100 MG/10ML IV SOLN
INTRAVENOUS | Status: DC | PRN
  Administered 2022-05-23: 10 mg via INTRAVENOUS
  Administered 2022-05-23: 50 mg via INTRAVENOUS
  Administered 2022-05-23: 20 mg via INTRAVENOUS

## 2022-05-23 MED ORDER — KETAMINE HCL 10 MG/ML IJ SOLN
INTRAMUSCULAR | Status: DC | PRN
  Administered 2022-05-23: 20 mg via INTRAVENOUS

## 2022-05-23 MED ORDER — FENTANYL CITRATE (PF) 100 MCG/2ML IJ SOLN
INTRAMUSCULAR | Status: DC | PRN
  Administered 2022-05-23 (×2): 50 ug via INTRAVENOUS

## 2022-05-23 MED ORDER — OXYCODONE HCL 5 MG PO TABS
ORAL_TABLET | ORAL | Status: AC
  Filled 2022-05-23: qty 1

## 2022-05-23 MED ORDER — ACETAMINOPHEN 500 MG PO TABS: 1000.0000 mg | ORAL_TABLET | ORAL | Status: AC

## 2022-05-23 MED ORDER — FENTANYL CITRATE (PF) 100 MCG/2ML IJ SOLN
INTRAMUSCULAR | Status: AC
  Administered 2022-05-23: 25 ug via INTRAVENOUS
  Filled 2022-05-23: qty 2

## 2022-05-23 MED ORDER — CELECOXIB 200 MG PO CAPS: 200.0000 mg | ORAL_CAPSULE | ORAL | Status: AC

## 2022-05-23 MED ORDER — BUPIVACAINE LIPOSOME 1.3 % IJ SUSP
INTRAMUSCULAR | Status: AC
  Filled 2022-05-23: qty 20

## 2022-05-23 MED ORDER — PROPOFOL 500 MG/50ML IV EMUL
INTRAVENOUS | Status: DC | PRN
  Administered 2022-05-23: 10 ug/kg/min via INTRAVENOUS

## 2022-05-23 MED ORDER — LACTATED RINGERS IV SOLN: INTRAVENOUS | Status: DC

## 2022-05-23 MED ORDER — GABAPENTIN 300 MG PO CAPS: 300.0000 mg | ORAL_CAPSULE | ORAL | Status: AC

## 2022-05-23 MED ORDER — CELECOXIB 200 MG PO CAPS
ORAL_CAPSULE | ORAL | Status: AC
  Administered 2022-05-23: 200 mg via ORAL
  Filled 2022-05-23: qty 1

## 2022-05-23 MED ORDER — LIDOCAINE HCL (PF) 2 % IJ SOLN
INTRAMUSCULAR | Status: AC
  Filled 2022-05-23: qty 5

## 2022-05-23 MED ORDER — OXYCODONE HCL 5 MG/5ML PO SOLN: 5.0000 mg | Freq: Once | ORAL | Status: AC | PRN

## 2022-05-23 MED ORDER — GABAPENTIN 300 MG PO CAPS
ORAL_CAPSULE | ORAL | Status: AC
  Administered 2022-05-23: 300 mg via ORAL
  Filled 2022-05-23: qty 1

## 2022-05-23 MED ORDER — ORAL CARE MOUTH RINSE: 15.0000 mL | Freq: Once | OROMUCOSAL | Status: AC

## 2022-05-23 MED ORDER — DEXAMETHASONE SODIUM PHOSPHATE 10 MG/ML IJ SOLN
INTRAMUSCULAR | Status: DC | PRN
  Administered 2022-05-23: 10 mg via INTRAVENOUS

## 2022-05-23 MED ORDER — LIDOCAINE HCL (CARDIAC) PF 100 MG/5ML IV SOSY
PREFILLED_SYRINGE | INTRAVENOUS | Status: DC | PRN
  Administered 2022-05-23: 100 mg via INTRAVENOUS

## 2022-05-23 MED ORDER — ONDANSETRON HCL 4 MG/2ML IJ SOLN: 4.0000 mg | Freq: Once | INTRAMUSCULAR | Status: DC | PRN

## 2022-05-23 MED ORDER — BUPIVACAINE-EPINEPHRINE (PF) 0.25% -1:200000 IJ SOLN
INTRAMUSCULAR | Status: AC
  Filled 2022-05-23: qty 30

## 2022-05-23 MED ORDER — PROPOFOL 10 MG/ML IV BOLUS
INTRAVENOUS | Status: DC | PRN
  Administered 2022-05-23: 200 mg via INTRAVENOUS

## 2022-05-23 MED ORDER — ALBUTEROL SULFATE HFA 108 (90 BASE) MCG/ACT IN AERS
INHALATION_SPRAY | RESPIRATORY_TRACT | Status: DC | PRN
  Administered 2022-05-23: 8 via RESPIRATORY_TRACT

## 2022-05-23 MED ORDER — OXYCODONE HCL 5 MG PO TABS
5.0000 mg | ORAL_TABLET | Freq: Once | ORAL | Status: AC | PRN
  Administered 2022-05-23: 5 mg via ORAL

## 2022-05-23 MED ORDER — FAMOTIDINE 20 MG PO TABS
ORAL_TABLET | ORAL | Status: AC
  Administered 2022-05-23: 20 mg via ORAL
  Filled 2022-05-23: qty 1

## 2022-05-23 MED ORDER — FENTANYL CITRATE (PF) 100 MCG/2ML IJ SOLN
25.0000 ug | INTRAMUSCULAR | Status: DC | PRN
  Administered 2022-05-23 (×3): 25 ug via INTRAVENOUS

## 2022-05-23 MED ORDER — CEFAZOLIN SODIUM-DEXTROSE 2-4 GM/100ML-% IV SOLN
2.0000 g | INTRAVENOUS | Status: AC
  Administered 2022-05-23: 2 g via INTRAVENOUS

## 2022-05-23 MED ORDER — ACETAMINOPHEN 500 MG PO TABS
ORAL_TABLET | ORAL | Status: AC
  Administered 2022-05-23: 1000 mg via ORAL
  Filled 2022-05-23: qty 2

## 2022-05-23 MED ORDER — ALBUTEROL SULFATE HFA 108 (90 BASE) MCG/ACT IN AERS
INHALATION_SPRAY | RESPIRATORY_TRACT | Status: AC
  Filled 2022-05-23: qty 6.7

## 2022-05-23 MED ORDER — MIDAZOLAM HCL 2 MG/2ML IJ SOLN
INTRAMUSCULAR | Status: AC
  Filled 2022-05-23: qty 2

## 2022-05-23 MED ORDER — FAMOTIDINE 20 MG PO TABS: 20.0000 mg | ORAL_TABLET | Freq: Once | ORAL | Status: AC

## 2022-05-23 MED ORDER — FENTANYL CITRATE (PF) 100 MCG/2ML IJ SOLN
INTRAMUSCULAR | Status: AC
  Filled 2022-05-23: qty 2

## 2022-05-23 MED ORDER — DEXMEDETOMIDINE HCL IN NACL 80 MCG/20ML IV SOLN
INTRAVENOUS | Status: DC | PRN
  Administered 2022-05-23: 8 ug via BUCCAL
  Administered 2022-05-23: 4 ug via BUCCAL

## 2022-05-23 MED ORDER — MIDAZOLAM HCL 2 MG/2ML IJ SOLN
INTRAMUSCULAR | Status: DC | PRN
  Administered 2022-05-23: 4 mg via INTRAVENOUS

## 2022-05-23 MED ORDER — ROCURONIUM BROMIDE 10 MG/ML (PF) SYRINGE
PREFILLED_SYRINGE | INTRAVENOUS | Status: AC
  Filled 2022-05-23: qty 10

## 2022-05-23 MED ORDER — ONDANSETRON HCL 4 MG/2ML IJ SOLN
INTRAMUSCULAR | Status: DC | PRN
  Administered 2022-05-23: 4 mg via INTRAVENOUS

## 2022-05-23 MED ORDER — NICOTINE 14 MG/24HR TD PT24
14.0000 mg | MEDICATED_PATCH | Freq: Every day | TRANSDERMAL | Status: DC
  Administered 2022-05-23: 14 mg via TRANSDERMAL
  Filled 2022-05-23: qty 1

## 2022-05-23 MED ORDER — OXYCODONE HCL 5 MG PO TABS: 5.0000 mg | ORAL_TABLET | Freq: Four times a day (QID) | ORAL | 0 refills | Status: DC | PRN

## 2022-05-23 MED ORDER — SUGAMMADEX SODIUM 500 MG/5ML IV SOLN
INTRAVENOUS | Status: DC | PRN
  Administered 2022-05-23: 200 mg via INTRAVENOUS

## 2022-05-23 MED ORDER — IBUPROFEN 800 MG PO TABS: 800.0000 mg | ORAL_TABLET | Freq: Three times a day (TID) | ORAL | 0 refills | Status: DC | PRN

## 2022-05-23 SURGICAL SUPPLY — 46 items
BLADE CLIPPER SURG (BLADE) ×2 IMPLANT
COVER TIP SHEARS 8 DVNC (MISCELLANEOUS) ×2 IMPLANT
COVER TIP SHEARS 8MM DA VINCI (MISCELLANEOUS) ×2
COVER WAND RF STERILE (DRAPES) ×2 IMPLANT
DERMABOND ADVANCED .7 DNX12 (GAUZE/BANDAGES/DRESSINGS) ×2 IMPLANT
DRAPE ARM DVNC X/XI (DISPOSABLE) ×6 IMPLANT
DRAPE COLUMN DVNC XI (DISPOSABLE) ×2 IMPLANT
DRAPE DA VINCI XI ARM (DISPOSABLE) ×6
DRAPE DA VINCI XI COLUMN (DISPOSABLE) ×2
ELECT REM PT RETURN 9FT ADLT (ELECTROSURGICAL) ×2
ELECTRODE REM PT RTRN 9FT ADLT (ELECTROSURGICAL) ×2 IMPLANT
GLOVE ORTHO TXT STRL SZ7.5 (GLOVE) ×6 IMPLANT
GOWN STRL REUS W/ TWL LRG LVL3 (GOWN DISPOSABLE) ×2 IMPLANT
GOWN STRL REUS W/ TWL XL LVL3 (GOWN DISPOSABLE) ×4 IMPLANT
GOWN STRL REUS W/TWL LRG LVL3 (GOWN DISPOSABLE) ×2
GOWN STRL REUS W/TWL XL LVL3 (GOWN DISPOSABLE) ×4
GRASPER SUT TROCAR 14GX15 (MISCELLANEOUS) IMPLANT
IRRIGATION STRYKERFLOW (MISCELLANEOUS) IMPLANT
IRRIGATOR STRYKERFLOW (MISCELLANEOUS)
IV NS 1000ML (IV SOLUTION)
IV NS 1000ML BAXH (IV SOLUTION) IMPLANT
KIT PINK PAD W/HEAD ARE REST (MISCELLANEOUS) ×2
KIT PINK PAD W/HEAD ARM REST (MISCELLANEOUS) ×2 IMPLANT
LABEL OR SOLS (LABEL) ×2 IMPLANT
MANIFOLD NEPTUNE II (INSTRUMENTS) ×2 IMPLANT
MESH 3DMAX LIGHT 4.1X6.2 LT LR (Mesh General) IMPLANT
MESH 3DMAX LIGHT 4.1X6.2 RT LR (Mesh General) IMPLANT
NDL INSUFFLATION 14GA 120MM (NEEDLE) IMPLANT
NEEDLE HYPO 22GX1.5 SAFETY (NEEDLE) ×2 IMPLANT
NEEDLE INSUFFLATION 14GA 120MM (NEEDLE) ×2 IMPLANT
PACK LAP CHOLECYSTECTOMY (MISCELLANEOUS) ×2 IMPLANT
SEAL CANN UNIV 5-8 DVNC XI (MISCELLANEOUS) ×6 IMPLANT
SEAL XI 5MM-8MM UNIVERSAL (MISCELLANEOUS) ×6
SET TUBE SMOKE EVAC HIGH FLOW (TUBING) ×2 IMPLANT
SOLUTION ELECTROLUBE (MISCELLANEOUS) ×2 IMPLANT
SUT MNCRL 4-0 (SUTURE) ×2
SUT MNCRL 4-0 27XMFL (SUTURE) ×2
SUT V-LOC 90 ABS 3-0 VLT  V-20 (SUTURE) ×4
SUT V-LOC 90 ABS 3-0 VLT V-20 (SUTURE) IMPLANT
SUT VIC AB 0 CT2 27 (SUTURE) ×2 IMPLANT
SUT VIC AB 2-0 RB1 27 (SUTURE) ×2 IMPLANT
SUT VLOC 90 2/L VL 12 GS22 (SUTURE) IMPLANT
SUT VLOC 90 S/L VL9 GS22 (SUTURE) IMPLANT
SUTURE MNCRL 4-0 27XMF (SUTURE) ×2 IMPLANT
TRAP FLUID SMOKE EVACUATOR (MISCELLANEOUS) ×2 IMPLANT
WATER STERILE IRR 500ML POUR (IV SOLUTION) ×2 IMPLANT

## 2022-05-23 NOTE — Discharge Instructions (Signed)

## 2022-05-23 NOTE — Anesthesia Postprocedure Evaluation (Signed)
Anesthesia Post Note  Patient: Timothy Finley  Procedure(s) Performed: XI ROBOTIC ASSISTED BILATERAL INGUINAL HERNIA (Bilateral) INSERTION OF MESH  Patient location during evaluation: PACU Anesthesia Type: General Level of consciousness: awake and alert Pain management: pain level controlled Vital Signs Assessment: post-procedure vital signs reviewed and stable Respiratory status: spontaneous breathing, nonlabored ventilation, respiratory function stable and patient connected to nasal cannula oxygen Cardiovascular status: blood pressure returned to baseline and stable Postop Assessment: no apparent nausea or vomiting Anesthetic complications: no   No notable events documented.   Last Vitals:  Vitals:   05/23/22 1300 05/23/22 1310  BP: 132/86   Pulse: 86 75  Resp: 17 11  Temp: 36.6 C   SpO2: 96% 96%    Last Pain:  Vitals:   05/23/22 1300  TempSrc:   PainSc: 3                  Arita Miss

## 2022-05-23 NOTE — Anesthesia Procedure Notes (Signed)
Procedure Name: Intubation Date/Time: 05/23/2022 10:08 AM  Performed by: Jonna Clark, CRNAPre-anesthesia Checklist: Patient identified, Patient being monitored, Timeout performed, Emergency Drugs available and Suction available Patient Re-evaluated:Patient Re-evaluated prior to induction Oxygen Delivery Method: Circle system utilized Preoxygenation: Pre-oxygenation with 100% oxygen Induction Type: IV induction Ventilation: Mask ventilation without difficulty Laryngoscope Size: Mac and 3 Grade View: Grade I Tube type: Oral Tube size: 7.0 mm Number of attempts: 2 Airway Equipment and Method: Stylet Placement Confirmation: ETT inserted through vocal cords under direct vision, positive ETCO2 and breath sounds checked- equal and bilateral Secured at: 22 cm Tube secured with: Tape Dental Injury: Teeth and Oropharynx as per pre-operative assessment

## 2022-05-23 NOTE — Transfer of Care (Signed)
Immediate Anesthesia Transfer of Care Note  Patient: Timothy Finley  Procedure(s) Performed: XI ROBOTIC ASSISTED BILATERAL INGUINAL HERNIA (Bilateral) INSERTION OF MESH  Patient Location: PACU  Anesthesia Type:General  Level of Consciousness: drowsy and patient cooperative  Airway & Oxygen Therapy: Patient Spontanous Breathing and Patient connected to face mask oxygen  Post-op Assessment: Report given to RN and Post -op Vital signs reviewed and stable  Post vital signs: Reviewed and stable  Last Vitals:  Vitals Value Taken Time  BP 117/71 05/23/22 1211  Temp 36.9 C 05/23/22 1211  Pulse 81 05/23/22 1213  Resp 14 05/23/22 1213  SpO2 100 % 05/23/22 1213  Vitals shown include unvalidated device data.  Last Pain:  Vitals:   05/23/22 1211  TempSrc:   PainSc: Asleep         Complications: No notable events documented.

## 2022-05-23 NOTE — Anesthesia Preprocedure Evaluation (Signed)
Anesthesia Evaluation  Patient identified by MRN, date of birth, ID band Patient awake    Reviewed: Allergy & Precautions, NPO status , Patient's Chart, lab work & pertinent test results  History of Anesthesia Complications Negative for: history of anesthetic complications  Airway Mallampati: II  TM Distance: >3 FB Neck ROM: Full    Dental no notable dental hx. (+) Teeth Intact   Pulmonary asthma , neg sleep apnea, neg COPD, Current Smoker and Patient abstained from smoking.,    Pulmonary exam normal breath sounds clear to auscultation       Cardiovascular Exercise Tolerance: Good METS(-) hypertension(-) CAD and (-) Past MI negative cardio ROS  (-) dysrhythmias  Rhythm:Regular Rate:Normal - Systolic murmurs    Neuro/Psych PSYCHIATRIC DISORDERS Anxiety negative neurological ROS     GI/Hepatic neg GERD  ,(+)     substance abuse  alcohol use and marijuana use, "5-6 beers per day, and a couple of drinks in the evenings   Endo/Other  neg diabetes  Renal/GU negative Renal ROS     Musculoskeletal   Abdominal   Peds  Hematology   Anesthesia Other Findings Past Medical History: No date: Allergic bronchitis No date: Anxiety No date: Kidney stones  Reproductive/Obstetrics                             Anesthesia Physical Anesthesia Plan  ASA: 2  Anesthesia Plan: General   Post-op Pain Management: Tylenol PO (pre-op)*, Celebrex PO (pre-op)* and Gabapentin PO (pre-op)*   Induction: Intravenous  PONV Risk Score and Plan: 3 and Ondansetron, Dexamethasone and Midazolam  Airway Management Planned: Oral ETT  Additional Equipment: None  Intra-op Plan:   Post-operative Plan: Extubation in OR  Informed Consent: I have reviewed the patients History and Physical, chart, labs and discussed the procedure including the risks, benefits and alternatives for the proposed anesthesia with the patient  or authorized representative who has indicated his/her understanding and acceptance.     Dental advisory given  Plan Discussed with: CRNA and Surgeon  Anesthesia Plan Comments: (Discussed risks of anesthesia with patient, including PONV, sore throat, lip/dental/eye damage. Rare risks discussed as well, such as cardiorespiratory and neurological sequelae, and allergic reactions. Discussed the role of CRNA in patient's perioperative care. Patient understands. Patient counseled on benefits of smoking cessation, and increased perioperative risks associated with continued smoking. )        Anesthesia Quick Evaluation

## 2022-05-23 NOTE — Op Note (Signed)
Robotic assisted Laparoscopic Transabdominal bilateral inguinal Hernia Repair with Mesh       Pre-operative Diagnosis: Bilateral inguinal Hernia, periumbilical eventration/question umbilical hernia.   Post-operative Diagnosis: Same, no evidence of umbilical hernia.   Procedure: Robotic assisted Laparoscopic  repair of bilateral inguinal hernia(s)   Surgeon: Ronny Bacon, M.D., FACS   Anesthesia: GETA   Findings: Bilateral fat filled inguinal hernias, no evidence of umbilical fascial defect.         Procedure Details  The patient was seen again in the Holding Room. The benefits, complications, treatment options, and expected outcomes were discussed with the patient. The risks of bleeding, infection, recurrence of symptoms, failure to resolve symptoms, recurrence of hernia, ischemic orchitis, chronic pain syndrome or neuroma, were reviewed again. The likelihood of improving the patient's symptoms with return to their baseline status is good.  The patient and/or family concurred with the proposed plan, giving informed consent.  The patient was taken to Operating Room, identified  and the procedure verified as Laparoscopic Inguinal Hernia Repair. Laterality confirmed.  A Time Out was held and the above information confirmed.   Prior to the induction of general anesthesia, antibiotic prophylaxis was administered. VTE prophylaxis was in place. General endotracheal anesthesia was then administered and tolerated well. After the induction, the abdomen was prepped with Chloraprep and draped in the sterile fashion. The patient was positioned in the supine position.   After local infiltration of quarter percent Marcaine with epinephrine, stab incision was made left upper quadrant.  On the left at Palmer's point, the Veress needle is passed with sensation of the layers to penetrate the abdominal wall and into the peritoneum.  Saline drop test is confirmed peritoneal placement.  Insufflation is initiated  with carbon dioxide to pressures of 15 mmHg. An 8.5 mm port is placed to the left off of the midline, with blunt tipped trocar.  Pneumoperitoneum maintained w/o HD changes using the AirSeal to pressures of 15 mm Hg with CO2. No evidence of bowel injuries.  Two 8.5 mm ports placed under direct vision in each upper quadrant. The laparoscopy revealed bilateral non-sac, fat filled indirect defect(s).   The robot was brought ot the table and docked in the standard fashion, no collision between arms was observed. Instruments were kept under direct view at all times. For bilateral inguinal hernia repair,  I developed a peritoneal flap. The sac(s) were reduced and dissected free from adjacent structures. We preserved the vas and the vessels, and visualized them to their convergence and beyond in the retroperitoneum. Once dissection was completed a large left sided BARD 3D Light mesh was placed and secured at three points with interrupted 0 Vicryl to the pubic tubercle and anteriorly. There was good coverage of the direct, indirect and femoral spaces.  Second look revealed no complications or injuries.  Attention then was turned to the opposite side. The sac was also reduced and dissected free from adjacent structures. We preserved the vas and the vessels, in like manner with adequate posterior dissection. Once dissection was completed the same, but contralateral mesh was placed and secured in like manner with interrupted 0 Vicryl. There was good coverage of the direct, indirect and femoral spaces.  The flap was then closed with 3-0 V-lock sutures.  Peritoneal closure without defects.  Once assuring that hemostasis was adequate, all needles/sponges removed, and the robot was undocked.  The ports were removed, the abdomen desulflated.  4-0 subcuticular Monocryl was used at all skin edges. Dermabond was placed.  Patient tolerated the procedure well. There were no complications. He was taken to the recovery room  in stable condition.  Photos taken.        Ronny Bacon, M.D., FACS 05/23/2022, 12:06 PM

## 2022-05-23 NOTE — Interval H&P Note (Signed)
History and Physical Interval Note:  05/23/2022 9:37 AM  Timothy Finley  has presented today for surgery, with the diagnosis of bilateral inguinal hernia.  The various methods of treatment have been discussed with the patient and family. After consideration of risks, benefits and other options for treatment, the patient has consented to  Procedure(s): XI ROBOTIC ASSISTED BILATERAL INGUINAL HERNIA (Bilateral) as a surgical intervention.  The patient's history has been reviewed, patient examined, no change in status, stable for surgery.  I have reviewed the patient's chart and labs.  Questions were answered to the patient's satisfaction.    Per pt's wishes ALSO will repair his rather small umbilical fascial defect as well.   Ronny Bacon

## 2022-05-24 ENCOUNTER — Encounter: Payer: Self-pay | Admitting: Surgery

## 2022-06-05 ENCOUNTER — Ambulatory Visit (INDEPENDENT_AMBULATORY_CARE_PROVIDER_SITE_OTHER): Payer: Commercial Managed Care - HMO | Admitting: Physician Assistant

## 2022-06-05 ENCOUNTER — Encounter: Payer: Self-pay | Admitting: Physician Assistant

## 2022-06-05 ENCOUNTER — Other Ambulatory Visit: Payer: Self-pay | Admitting: Family Medicine

## 2022-06-05 ENCOUNTER — Other Ambulatory Visit: Payer: Self-pay

## 2022-06-05 VITALS — BP 145/94 | HR 118 | Temp 98.8°F | Ht 69.0 in | Wt 155.0 lb

## 2022-06-05 DIAGNOSIS — K4020 Bilateral inguinal hernia, without obstruction or gangrene, not specified as recurrent: Secondary | ICD-10-CM

## 2022-06-05 DIAGNOSIS — Z09 Encounter for follow-up examination after completed treatment for conditions other than malignant neoplasm: Secondary | ICD-10-CM

## 2022-06-05 NOTE — Patient Instructions (Signed)

## 2022-06-05 NOTE — Progress Notes (Signed)
[facility] POST-OP OFFICE VISIT  06/05/2022  HPI: Timothy Finley is a 51 y.o. male 13 days s/p robotic assisted laparoscopic bilateral inguinal hernia repair with Dr Christian Mate   He is doing well Has not needed pain medications since day 3 No fever, chills, nausea, emesis, bowel changes, or urinary changes Incisions are well healed No other complaints   Vital signs: BP (!) 145/94   Pulse (!) 118   Temp 98.8 F (37.1 C) (Oral)   Ht '5\' 9"'$  (1.753 m)   Wt 155 lb (70.3 kg)   SpO2 97%   BMI 22.89 kg/m    Physical Exam: Constitutional: Well appearing make, NAD Abdomen: Soft, non-tender, non-distended, no rebound/guarding Skin: Laparoscopic incisions are healing well, no erythema or drainage   Assessment/Plan: This is a 51 y.o. male 13 days s/p robotic assisted laparoscopic bilateral inguinal hernia repair with Dr Christian Mate    - Pain control prn  - Reviewed wound care recommendation  - Reviewed lifting restrictions; 4-6 weeks total  - He can follow up on as needed basis; He understands to call with questions/concerns  -- Edison Simon, PA-C [facility] 06/05/2022, 1:50 PM M-F: 7am - 4pm

## 2022-06-07 ENCOUNTER — Ambulatory Visit (INDEPENDENT_AMBULATORY_CARE_PROVIDER_SITE_OTHER): Payer: Commercial Managed Care - HMO | Admitting: Family Medicine

## 2022-06-07 ENCOUNTER — Encounter: Payer: Self-pay | Admitting: Family Medicine

## 2022-06-07 VITALS — BP 133/87 | HR 80 | Temp 98.3°F | Ht 68.7 in | Wt 156.7 lb

## 2022-06-07 DIAGNOSIS — J4521 Mild intermittent asthma with (acute) exacerbation: Secondary | ICD-10-CM

## 2022-06-07 DIAGNOSIS — Z0000 Encounter for general adult medical examination without abnormal findings: Secondary | ICD-10-CM | POA: Diagnosis not present

## 2022-06-07 DIAGNOSIS — Z1211 Encounter for screening for malignant neoplasm of colon: Secondary | ICD-10-CM | POA: Diagnosis not present

## 2022-06-07 DIAGNOSIS — F410 Panic disorder [episodic paroxysmal anxiety] without agoraphobia: Secondary | ICD-10-CM

## 2022-06-07 DIAGNOSIS — E782 Mixed hyperlipidemia: Secondary | ICD-10-CM | POA: Diagnosis not present

## 2022-06-07 LAB — URINALYSIS, ROUTINE W REFLEX MICROSCOPIC
Bilirubin, UA: NEGATIVE
Glucose, UA: NEGATIVE
Leukocytes,UA: NEGATIVE
Nitrite, UA: NEGATIVE
Protein,UA: NEGATIVE
RBC, UA: NEGATIVE
Specific Gravity, UA: 1.015 (ref 1.005–1.030)
Urobilinogen, Ur: 0.2 mg/dL (ref 0.2–1.0)
pH, UA: 5.5 (ref 5.0–7.5)

## 2022-06-07 MED ORDER — FLUTICASONE-SALMETEROL 250-50 MCG/ACT IN AEPB: INHALATION_SPRAY | RESPIRATORY_TRACT | 12 refills | Status: DC

## 2022-06-07 MED ORDER — BUSPIRONE HCL 5 MG PO TABS: 5.0000 mg | ORAL_TABLET | Freq: Two times a day (BID) | ORAL | 1 refills | Status: DC

## 2022-06-07 MED ORDER — ROSUVASTATIN CALCIUM 40 MG PO TABS: 40.0000 mg | ORAL_TABLET | Freq: Every day | ORAL | 1 refills | Status: DC

## 2022-06-07 MED ORDER — ALBUTEROL SULFATE HFA 108 (90 BASE) MCG/ACT IN AERS: 1.0000 | INHALATION_SPRAY | Freq: Four times a day (QID) | RESPIRATORY_TRACT | 3 refills | Status: DC | PRN

## 2022-06-07 MED ORDER — CLONAZEPAM 0.5 MG PO TABS: 0.5000 mg | ORAL_TABLET | Freq: Two times a day (BID) | ORAL | 0 refills | Status: DC | PRN

## 2022-06-07 NOTE — Assessment & Plan Note (Signed)
Under good control on current regimen. Continue current regimen. Continue to monitor. Call with any concerns. Refills given. Labs drawn today.   

## 2022-06-07 NOTE — Progress Notes (Signed)
BP 133/87   Pulse 80   Temp 98.3 F (36.8 C) (Oral)   Ht 5' 8.7" (1.745 m)   Wt 156 lb 11.2 oz (71.1 kg)   SpO2 98%   BMI 23.34 kg/m    Subjective:    Patient ID: Timothy Finley, male    DOB: Mar 13, 1971, 51 y.o.   MRN: [id number]  HPI: Timothy Finley is a 51 y.o. male presenting on 06/07/2022 for comprehensive medical examination. Current medical complaints include:  ANXIETY/STRESS Duration: chronic Status:stable Anxious mood: yes  Excessive worrying: no Irritability: no  Sweating: yes Nausea: yes Palpitations:yes Hyperventilation: no Panic attacks: yes Agoraphobia: no  Obscessions/compulsions: no Depressed mood: no    06/07/2022    2:41 PM 04/27/2022    8:41 AM 01/11/2022   11:27 AM 12/14/2021    1:10 PM 11/06/2021   11:28 AM  Depression screen PHQ 2/9  Decreased Interest 1 0 0 0 0  Down, Depressed, Hopeless 0 1 1 0 0  PHQ - 2 Score '1 1 1 '$ 0 0  Altered sleeping '1 1 2 1 1  '$ Tired, decreased energy 0 1 1 0 1  Change in appetite 0 0 0 0 1  Feeling bad or failure about yourself  0 1 1 0 1  Trouble concentrating 0 0 1 0 0  Moving slowly or fidgety/restless 0 0 0 0 1  Suicidal thoughts 0 0 0 0 0  PHQ-9 Score '2 4 6 1 5  '$ Difficult doing work/chores Somewhat difficult Somewhat difficult      Anhedonia: no Weight changes: no Insomnia: no   Hypersomnia: no Fatigue/loss of energy: yes Feelings of worthlessness: no Feelings of guilt: no Impaired concentration/indecisiveness: no Suicidal ideations: no  Crying spells: no Recent Stressors/Life Changes: yes   Relationship problems: no   Family stress: no     Financial stress: yes    Job stress: yes    Recent death/loss: no  ASTHMA Asthma status: controlled Satisfied with current treatment?: yes Albuterol/rescue inhaler frequency: rarely Dyspnea frequency: rarely Wheezing frequency: occasionally Cough frequency: occasionally Nocturnal symptom frequency: rarely  Limitation of activity: no Current upper  respiratory symptoms: no Aerochamber/spacer use: no Visits to ER or Urgent Care in past year: no Pneumovax: Up to Date Influenza:  Declined  HYPERLIPIDEMIA Hyperlipidemia status: excellent compliance Satisfied with current treatment?  yes Side effects:  no Medication compliance: excellent compliance Past cholesterol meds: crestor Supplements: none Aspirin:  no The 10-year ASCVD risk score (Arnett DK, et al., 2019) is: 6.2%   Values used to calculate the score:     Age: 80 years     Sex: Male     Is Non-Hispanic African American: No     Diabetic: No     Tobacco smoker: Yes     Systolic Blood Pressure: 433 mmHg     Is BP treated: No     HDL Cholesterol: 59 mg/dL     Total Cholesterol: 186 mg/dL Chest pain:  no Coronary artery disease:  no  He currently lives with: wife and kids Interim Problems from his last visit: no  Depression Screen done today and results listed below:     06/07/2022    2:41 PM 04/27/2022    8:41 AM 01/11/2022   11:27 AM 12/14/2021    1:10 PM 11/06/2021   11:28 AM  Depression screen PHQ 2/9  Decreased Interest 1 0 0 0 0  Down, Depressed, Hopeless 0 1 1 0 0  PHQ - 2 Score  $'1 1 1 'h$ 0 0  Altered sleeping '1 1 2 1 1  '$ Tired, decreased energy 0 1 1 0 1  Change in appetite 0 0 0 0 1  Feeling bad or failure about yourself  0 1 1 0 1  Trouble concentrating 0 0 1 0 0  Moving slowly or fidgety/restless 0 0 0 0 1  Suicidal thoughts 0 0 0 0 0  PHQ-9 Score '2 4 6 1 5  '$ Difficult doing work/chores Somewhat difficult Somewhat difficult       Past Medical History:  Past Medical History:  Diagnosis Date   Allergic bronchitis    Anxiety    Kidney stones     Surgical History:  Past Surgical History:  Procedure Laterality Date   INSERTION OF MESH  05/23/2022   Procedure: INSERTION OF MESH;  Surgeon: Ronny Bacon, MD;  Location: [facility] ORS;  Service: General;;   WISDOM TOOTH EXTRACTION      Medications:  Current Outpatient Medications on File Prior to Visit   Medication Sig   albuterol (PROVENTIL) (2.5 MG/3ML) 0.083% nebulizer solution Take 3 mLs (2.5 mg total) by nebulization every 6 (six) hours as needed for wheezing or shortness of breath.   cetirizine (ZYRTEC) 10 MG tablet Take 10 mg by mouth at bedtime.   fluticasone (FLONASE) 50 MCG/ACT nasal spray Place 2 sprays into both nostrils daily.   triamcinolone ointment (KENALOG) 0.5 % Apply 1 application topically 2 (two) times daily.   No current facility-administered medications on file prior to visit.    Allergies:  Allergies  Allergen Reactions   Morphine Other (See Comments)    Morphine oral medication. Patient was unable to move his body and unable to speak that lasted about 30 minutes. Per patient , it is "sleep paralysis"     Social History:  Social History   Socioeconomic History   Marital status: Married    Spouse name: Not on file   Number of children: Not on file   Years of education: Not on file   Highest education level: Not on file  Occupational History   Not on file  Tobacco Use   Smoking status: Every Day    Packs/day: 1.00    Types: Cigarettes   Smokeless tobacco: Former  Scientific laboratory technician Use: Never used  Substance and Sexual Activity   Alcohol use: Yes    Alcohol/week: 49.0 standard drinks of alcohol    Types: 42 Cans of beer, 7 Shots of liquor per week    Comment: 4-6 beers daily   Drug use: Yes    Types: Marijuana    Comment: maybe once or twice a month    Sexual activity: Yes    Birth control/protection: None  Other Topics Concern   Not on file  Social History Narrative   Not on file   Social Determinants of Health   Financial Resource Strain: Not on file  Food Insecurity: Not on file  Transportation Needs: Not on file  Physical Activity: Not on file  Stress: Not on file  Social Connections: Not on file  Intimate Partner Violence: Not on file   Social History   Tobacco Use  Smoking Status Every Day   Packs/day: 1.00   Types:  Cigarettes  Smokeless Tobacco Former   Social History   Substance and Sexual Activity  Alcohol Use Yes   Alcohol/week: 49.0 standard drinks of alcohol   Types: 42 Cans of beer, 7 Shots of liquor per week  Comment: 4-6 beers daily    Family History:  Family History  Problem Relation Age of Onset   Cancer Maternal Grandmother        Breast   Cancer Maternal Grandfather    Aneurysm Paternal Grandfather        Brain    Past medical history, surgical history, medications, allergies, family history and social history reviewed with patient today and changes made to appropriate areas of the chart.   Review of Systems  Constitutional: Negative.   HENT: Negative.    Eyes: Negative.   Respiratory: Negative.    Cardiovascular: Negative.   Gastrointestinal:  Positive for diarrhea and heartburn (occasionally with food choices). Negative for abdominal pain, blood in stool, constipation, melena, nausea and vomiting.  Genitourinary: Negative.   Musculoskeletal: Negative.   Skin: Negative.   Neurological: Negative.   Endo/Heme/Allergies:  Negative for environmental allergies and polydipsia. Bruises/bleeds easily.  Psychiatric/Behavioral:  Negative for depression, hallucinations, memory loss, substance abuse and suicidal ideas. The patient is nervous/anxious and has insomnia.    All other ROS negative except what is listed above and in the HPI.      Objective:    BP 133/87   Pulse 80   Temp 98.3 F (36.8 C) (Oral)   Ht 5' 8.7" (1.745 m)   Wt 156 lb 11.2 oz (71.1 kg)   SpO2 98%   BMI 23.34 kg/m   Wt Readings from Last 3 Encounters:  06/07/22 156 lb 11.2 oz (71.1 kg)  06/05/22 155 lb (70.3 kg)  05/23/22 155 lb (70.3 kg)    Physical Exam Vitals and nursing note reviewed.  Constitutional:      General: He is not in acute distress.    Appearance: Normal appearance. He is normal weight. He is not ill-appearing, toxic-appearing or diaphoretic.  HENT:     Head: Normocephalic and  atraumatic.     Right Ear: Tympanic membrane, ear canal and external ear normal. There is no impacted cerumen.     Left Ear: Tympanic membrane, ear canal and external ear normal. There is no impacted cerumen.     Nose: Nose normal. No congestion or rhinorrhea.     Mouth/Throat:     Mouth: Mucous membranes are moist.     Pharynx: Oropharynx is clear. No oropharyngeal exudate or posterior oropharyngeal erythema.  Eyes:     General: No scleral icterus.       Right eye: No discharge.        Left eye: No discharge.     Extraocular Movements: Extraocular movements intact.     Conjunctiva/sclera: Conjunctivae normal.     Pupils: Pupils are equal, round, and reactive to light.  Neck:     Vascular: No carotid bruit.  Cardiovascular:     Rate and Rhythm: Normal rate and regular rhythm.     Pulses: Normal pulses.     Heart sounds: No murmur heard.    No friction rub. No gallop.  Pulmonary:     Effort: Pulmonary effort is normal. No respiratory distress.     Breath sounds: Normal breath sounds. No stridor. No wheezing, rhonchi or rales.  Chest:     Chest wall: No tenderness.  Abdominal:     General: Abdomen is flat. Bowel sounds are normal. There is no distension.     Palpations: Abdomen is soft. There is no mass.     Tenderness: There is no abdominal tenderness. There is no right CVA tenderness, left CVA tenderness, guarding or rebound.  Hernia: No hernia is present.  Genitourinary:    Comments: Genital exam deferred with shared decision making Musculoskeletal:        General: No swelling, tenderness, deformity or signs of injury.     Cervical back: Normal range of motion and neck supple. No rigidity. No muscular tenderness.     Right lower leg: No edema.     Left lower leg: No edema.  Lymphadenopathy:     Cervical: No cervical adenopathy.  Skin:    General: Skin is warm and dry.     Capillary Refill: Capillary refill takes less than 2 seconds.     Coloration: Skin is not jaundiced  or pale.     Findings: No bruising, erythema, lesion or rash.  Neurological:     General: No focal deficit present.     Mental Status: He is alert and oriented to person, place, and time.     Cranial Nerves: No cranial nerve deficit.     Sensory: No sensory deficit.     Motor: No weakness.     Coordination: Coordination normal.     Gait: Gait normal.     Deep Tendon Reflexes: Reflexes normal.  Psychiatric:        Mood and Affect: Mood normal.        Behavior: Behavior normal.        Thought Content: Thought content normal.        Judgment: Judgment normal.     Results for orders placed or performed during the hospital encounter of 05/18/22  CBC with Differential/Platelet  Result Value Ref Range   WBC 10.4 4.0 - 10.5 K/uL   RBC 4.55 4.22 - 5.81 MIL/uL   Hemoglobin 14.8 13.0 - 17.0 g/dL   HCT 43.3 39.0 - 52.0 %   MCV 95.2 80.0 - 100.0 fL   MCH 32.5 26.0 - 34.0 pg   MCHC 34.2 30.0 - 36.0 g/dL   RDW 12.3 11.5 - 15.5 %   Platelets 277 150 - 400 K/uL   nRBC 0.0 0.0 - 0.2 %   Neutrophils Relative % 56 %   Neutro Abs 5.9 1.7 - 7.7 K/uL   Lymphocytes Relative 29 %   Lymphs Abs 3.0 0.7 - 4.0 K/uL   Monocytes Relative 10 %   Monocytes Absolute 1.1 (H) 0.1 - 1.0 K/uL   Eosinophils Relative 2 %   Eosinophils Absolute 0.2 0.0 - 0.5 K/uL   Basophils Relative 1 %   Basophils Absolute 0.1 0.0 - 0.1 K/uL   Immature Granulocytes 2 %   Abs Immature Granulocytes 0.18 (H) 0.00 - 0.07 K/uL  Comprehensive metabolic panel  Result Value Ref Range   Sodium 138 135 - 145 mmol/L   Potassium 4.3 3.5 - 5.1 mmol/L   Chloride 105 98 - 111 mmol/L   CO2 27 22 - 32 mmol/L   Glucose, Bld 116 (H) 70 - 99 mg/dL   BUN 12 6 - 20 mg/dL   Creatinine, Ser 0.87 0.61 - 1.24 mg/dL   Calcium 9.7 8.9 - 10.3 mg/dL   Total Protein 7.5 6.5 - 8.1 g/dL   Albumin 4.3 3.5 - 5.0 g/dL   AST 29 15 - 41 U/L   ALT 50 (H) 0 - 44 U/L   Alkaline Phosphatase 81 38 - 126 U/L   Total Bilirubin 0.9 0.3 - 1.2 mg/dL   GFR,  Estimated >60 >60 mL/min   Anion gap 6 5 - 15      Assessment & Plan:  Problem List Items Addressed This Visit       Respiratory   Asthma    Under good control on current regimen. Continue current regimen. Continue to monitor. Call with any concerns. Refills given. Labs drawn today.       Relevant Medications   albuterol (VENTOLIN HFA) 108 (90 Base) MCG/ACT inhaler   fluticasone-salmeterol (WIXELA INHUB) 250-50 MCG/ACT AEPB     Other   Panic disorder    Under fair control on current regimen. Will increase his buspar to 1-2 tabs BID. Continue to monitor. Call with any concerns. Refill given today. 60 pills should last 3 months. Follow up 3 months.       Relevant Medications   busPIRone (BUSPAR) 5 MG tablet   Other Relevant Orders   [id number] 11+Oxyco+Alc+Crt-Bund   Hyperlipidemia    Under good control on current regimen. Continue current regimen. Continue to monitor. Call with any concerns. Refills given. Labs drawn today.      Relevant Medications   rosuvastatin (CRESTOR) 40 MG tablet   Other Visit Diagnoses     Routine general medical examination at a health care facility    -  Primary   Vaccines up to date/declined. Screening labs checked today. Cologuard ordered. Continue diet and exercise. Call with any concerns.   Relevant Orders   Comprehensive metabolic panel   CBC with Differential/Platelet   Lipid Panel w/o Chol/HDL Ratio   PSA   TSH   Urinalysis, Routine w reflex microscopic   Screening for colon cancer       Cologuard ordered today.   Relevant Orders   Cologuard        LABORATORY TESTING:  Health maintenance labs ordered today as discussed above.   The natural history of prostate cancer and ongoing controversy regarding screening and potential treatment outcomes of prostate cancer has been discussed with the patient. The meaning of a false positive PSA and a false negative PSA has been discussed. He indicates understanding of the limitations of this  screening test and wishes to proceed with screening PSA testing.   IMMUNIZATIONS:   - Tdap: Tetanus vaccination status reviewed: last tetanus booster within 10 years. - Influenza: Refused - Pneumovax: Up to date - Prevnar: Not applicable - COVID: Up to date - HPV: Not applicable - Shingrix vaccine: Up to date  SCREENING: - Colonoscopy: cologuard ordered  Discussed with patient purpose of the colonoscopy is to detect colon cancer at curable precancerous or early stages   PATIENT COUNSELING:    Sexuality: Discussed sexually transmitted diseases, partner selection, use of condoms, avoidance of unintended pregnancy  and contraceptive alternatives.   Advised to avoid cigarette smoking.  I discussed with the patient that most people either abstain from alcohol or drink within safe limits (<=14/week and <=4 drinks/occasion for males, <=7/weeks and <= 3 drinks/occasion for females) and that the risk for alcohol disorders and other health effects rises proportionally with the number of drinks per week and how often a drinker exceeds daily limits.  Discussed cessation/primary prevention of drug use and availability of treatment for abuse.   Diet: Encouraged to adjust caloric intake to maintain  or achieve ideal body weight, to reduce intake of dietary saturated fat and total fat, to limit sodium intake by avoiding high sodium foods and not adding table salt, and to maintain adequate dietary potassium and calcium preferably from fresh fruits, vegetables, and low-fat dairy products.    stressed the importance of regular exercise  Injury prevention: Discussed safety belts,  safety helmets, smoke detector, smoking near bedding or upholstery.   Dental health: Discussed importance of regular tooth brushing, flossing, and dental visits.   Follow up plan: NEXT PREVENTATIVE PHYSICAL DUE IN 1 YEAR. Return in about 3 months (around 09/07/2022) for Virtual [facility].

## 2022-06-07 NOTE — Assessment & Plan Note (Signed)
Under fair control on current regimen. Will increase his buspar to 1-2 tabs BID. Continue to monitor. Call with any concerns. Refill given today. 60 pills should last 3 months. Follow up 3 months.

## 2022-06-08 LAB — CBC WITH DIFFERENTIAL/PLATELET
Basophils Absolute: 0.1 10*3/uL (ref 0.0–0.2)
Basos: 1 %
EOS (ABSOLUTE): 0.3 10*3/uL (ref 0.0–0.4)
Eos: 3 %
Hematocrit: 45.4 % (ref 37.5–51.0)
Hemoglobin: 15.4 g/dL (ref 13.0–17.7)
Immature Grans (Abs): 0.2 10*3/uL — ABNORMAL HIGH (ref 0.0–0.1)
Immature Granulocytes: 2 %
Lymphocytes Absolute: 2.8 10*3/uL (ref 0.7–3.1)
Lymphs: 24 %
MCH: 32.1 pg (ref 26.6–33.0)
MCHC: 33.9 g/dL (ref 31.5–35.7)
MCV: 95 fL (ref 79–97)
Monocytes Absolute: 0.9 10*3/uL (ref 0.1–0.9)
Monocytes: 8 %
Neutrophils Absolute: 7.3 10*3/uL — ABNORMAL HIGH (ref 1.4–7.0)
Neutrophils: 62 %
Platelets: 309 10*3/uL (ref 150–450)
RBC: 4.8 x10E6/uL (ref 4.14–5.80)
RDW: 12 % (ref 11.6–15.4)
WBC: 11.5 10*3/uL — ABNORMAL HIGH (ref 3.4–10.8)

## 2022-06-08 LAB — COMPREHENSIVE METABOLIC PANEL
ALT: 54 IU/L — ABNORMAL HIGH (ref 0–44)
AST: 25 IU/L (ref 0–40)
Albumin/Globulin Ratio: 2.1 (ref 1.2–2.2)
Albumin: 5 g/dL (ref 4.1–5.1)
Alkaline Phosphatase: 101 IU/L (ref 44–121)
BUN/Creatinine Ratio: 8 — ABNORMAL LOW (ref 9–20)
BUN: 7 mg/dL (ref 6–24)
Bilirubin Total: 0.5 mg/dL (ref 0.0–1.2)
CO2: 25 mmol/L (ref 20–29)
Calcium: 9.8 mg/dL (ref 8.7–10.2)
Chloride: 102 mmol/L (ref 96–106)
Creatinine, Ser: 0.85 mg/dL (ref 0.76–1.27)
Globulin, Total: 2.4 g/dL (ref 1.5–4.5)
Glucose: 94 mg/dL (ref 70–99)
Potassium: 4.2 mmol/L (ref 3.5–5.2)
Sodium: 144 mmol/L (ref 134–144)
Total Protein: 7.4 g/dL (ref 6.0–8.5)
eGFR: 106 mL/min/{1.73_m2} (ref >59)

## 2022-06-08 LAB — DRUG SCREEN 764883 11+OXYCO+ALC+CRT-BUND
Amphetamines, Urine: NEGATIVE ng/mL
BENZODIAZ UR QL: NEGATIVE ng/mL
Barbiturate: NEGATIVE ng/mL
Cannabinoid Quant, Ur: NEGATIVE ng/mL
Cocaine (Metabolite): NEGATIVE ng/mL
Creatinine: 88.2 mg/dL (ref 20.0–300.0)
Ethanol: NEGATIVE %
Meperidine: NEGATIVE ng/mL
Methadone Screen, Urine: NEGATIVE ng/mL
OPIATE SCREEN URINE: NEGATIVE ng/mL
Oxycodone/Oxymorphone, Urine: NEGATIVE ng/mL
Phencyclidine: NEGATIVE ng/mL
Propoxyphene: NEGATIVE ng/mL
Tramadol: NEGATIVE ng/mL
pH, Urine: 5.7 (ref 4.5–8.9)

## 2022-06-08 LAB — LIPID PANEL W/O CHOL/HDL RATIO
Cholesterol, Total: 185 mg/dL (ref 100–199)
HDL: 73 mg/dL (ref >39)
LDL Chol Calc (NIH): 71 mg/dL (ref 0–99)
Triglycerides: 255 mg/dL — ABNORMAL HIGH (ref 0–149)
VLDL Cholesterol Cal: 41 mg/dL — ABNORMAL HIGH (ref 5–40)

## 2022-06-08 LAB — PSA: Prostate Specific Ag, Serum: 1.8 ng/mL (ref 0.0–4.0)

## 2022-06-08 LAB — TSH: TSH: 1.11 u[IU]/mL (ref 0.450–4.500)

## 2022-06-24 ENCOUNTER — Encounter: Payer: Self-pay | Admitting: Family Medicine

## 2022-06-25 NOTE — Telephone Encounter (Signed)
Great. Please let him know that he was just trying to fill it too soon

## 2022-06-25 NOTE — Telephone Encounter (Signed)
Can we please find out why pharmacy says generic albuterol is not covered?

## 2022-07-06 ENCOUNTER — Ambulatory Visit: Payer: Self-pay

## 2022-07-06 NOTE — Telephone Encounter (Signed)
  Chief Complaint: Cough - chest tightness Symptoms: Above Frequency: Before Thanksgiving Pertinent Negatives: Patient denies Fever Disposition: '[]'$ ED /'[]'$ Urgent Care (no appt availability in office) / '[x]'$ Appointment(In office/virtual)/ '[]'$  [facility]/ '[]'$ Home Care/ '[]'$ Refused Recommended Disposition /'[]'$ [facility]/ '[]'$  Follow-up with PCP Additional Notes: Pt started with sinus infection before thanksgiving. Pt now reports a dry cough , chest tightness, and Mild SOB. PT states he always has SOB as a smoker,    Reason for Disposition  Cough has been present for > 3 weeks  Answer Assessment - Initial Assessment Questions 1. ONSET: "When did the cough begin?"      Before thanksgiving 2. SEVERITY: "How bad is the cough today?"      Severe 3. SPUTUM: "Describe the color of your sputum" (none, dry cough; clear, white, yellow, green)     Yellow 4. HEMOPTYSIS: "Are you coughing up any blood?" If so ask: "How much?" (flecks, streaks, tablespoons, etc.)     no 5. DIFFICULTY BREATHING: "Are you having difficulty breathing?" If Yes, ask: "How bad is it?" (e.g., mild, moderate, severe)    - MILD: No SOB at rest, mild SOB with walking, speaks normally in sentences, can lie down, no retractions, pulse < 100.    - MODERATE: SOB at rest, SOB with minimal exertion and prefers to sit, cannot lie down flat, speaks in phrases, mild retractions, audible wheezing, pulse 100-120.    - SEVERE: Very SOB at rest, speaks in single words, struggling to breathe, sitting hunched forward, retractions, pulse > 120      Mild - moderate 6. FEVER: "Do you have a fever?" If Yes, ask: "What is your temperature, how was it measured, and when did it start?"     no 7. CARDIAC HISTORY: "Do you have any history of heart disease?" (e.g., heart attack, congestive heart failure)      no 8. LUNG HISTORY: "Do you have any history of lung disease?"  (e.g., pulmonary embolus, asthma, emphysema)    COPD 9. PE RISK  FACTORS: "Do you have a history of blood clots?" (or: recent major surgery, recent prolonged travel, bedridden)      10. OTHER SYMPTOMS: "Do you have any other symptoms?" (e.g., runny nose, wheezing, chest pain)       Chest tightness - wheezing 11. PREGNANCY: "Is there any chance you are pregnant?" "When was your last menstrual period?"        12. TRAVEL: "Have you traveled out of the country in the last month?" (e.g., travel history, exposures)  Protocols used: Cough - Acute Productive-A-AH

## 2022-07-09 ENCOUNTER — Ambulatory Visit: Payer: Commercial Managed Care - HMO | Admitting: Nurse Practitioner

## 2022-07-09 NOTE — Progress Notes (Deleted)
There were no vitals taken for this visit.   Subjective:    Patient ID: Timothy Finley, male    DOB: February 07, 1971, 51 y.o.   MRN: [id number]  HPI: Timothy Finley is a 51 y.o. male  No chief complaint on file.  UPPER RESPIRATORY TRACT INFECTION Worst symptom: Fever: {Blank single:19197::"yes","no"} Cough: {Blank single:19197::"yes","no"} Shortness of breath: {Blank single:19197::"yes","no"} Wheezing: {Blank single:19197::"yes","no"} Chest pain: {Blank single:19197::"yes","no","yes, with cough"} Chest tightness: {Blank single:19197::"yes","no"} Chest congestion: {Blank single:19197::"yes","no"} Nasal congestion: {Blank single:19197::"yes","no"} Runny nose: {Blank single:19197::"yes","no"} Post nasal drip: {Blank single:19197::"yes","no"} Sneezing: {Blank single:19197::"yes","no"} Sore throat: {Blank single:19197::"yes","no"} Swollen glands: {Blank single:19197::"yes","no"} Sinus pressure: {Blank single:19197::"yes","no"} Headache: {Blank single:19197::"yes","no"} Face pain: {Blank single:19197::"yes","no"} Toothache: {Blank single:19197::"yes","no"} Ear pain: {Blank single:19197::"yes","no"} {Blank single:19197::""right","left", "bilateral"} Ear pressure: {Blank single:19197::"yes","no"} {Blank single:19197::""right","left", "bilateral"} Eyes red/itching:{Blank single:19197::"yes","no"} Eye drainage/crusting: {Blank single:19197::"yes","no"}  Vomiting: {Blank single:19197::"yes","no"} Rash: {Blank single:19197::"yes","no"} Fatigue: {Blank single:19197::"yes","no"} Sick contacts: {Blank single:19197::"yes","no"} Strep contacts: {Blank single:19197::"yes","no"}  Context: {Blank multiple:19196::"better","worse","stable","fluctuating"} Recurrent sinusitis: {Blank single:19197::"yes","no"} Relief with OTC cold/cough medications: {Blank single:19197::"yes","no"}  Treatments attempted: {Blank multiple:19196::"none","cold/sinus","mucinex","anti-histamine","pseudoephedrine","cough  syrup","antibiotics"}   Relevant past medical, surgical, family and social history reviewed and updated as indicated. Interim medical history since our last visit reviewed. Allergies and medications reviewed and updated.  Review of Systems  Per HPI unless specifically indicated above     Objective:    There were no vitals taken for this visit.  Wt Readings from Last 3 Encounters:  06/07/22 156 lb 11.2 oz (71.1 kg)  06/05/22 155 lb (70.3 kg)  05/23/22 155 lb (70.3 kg)    Physical Exam  Results for orders placed or performed in visit on 06/07/22  Comprehensive metabolic panel  Result Value Ref Range   Glucose 94 70 - 99 mg/dL   BUN 7 6 - 24 mg/dL   Creatinine, Ser 0.85 0.76 - 1.27 mg/dL   eGFR 106 >59 mL/min/1.73   BUN/Creatinine Ratio 8 (L) 9 - 20   Sodium 144 134 - 144 mmol/L   Potassium 4.2 3.5 - 5.2 mmol/L   Chloride 102 96 - 106 mmol/L   CO2 25 20 - 29 mmol/L   Calcium 9.8 8.7 - 10.2 mg/dL   Total Protein 7.4 6.0 - 8.5 g/dL   Albumin 5.0 4.1 - 5.1 g/dL   Globulin, Total 2.4 1.5 - 4.5 g/dL   Albumin/Globulin Ratio 2.1 1.2 - 2.2   Bilirubin Total 0.5 0.0 - 1.2 mg/dL   Alkaline Phosphatase 101 44 - 121 IU/L   AST 25 0 - 40 IU/L   ALT 54 (H) 0 - 44 IU/L  CBC with Differential/Platelet  Result Value Ref Range   WBC 11.5 (H) 3.4 - 10.8 x10E3/uL   RBC 4.80 4.14 - 5.80 x10E6/uL   Hemoglobin 15.4 13.0 - 17.7 g/dL   Hematocrit 45.4 37.5 - 51.0 %   MCV 95 79 - 97 fL   MCH 32.1 26.6 - 33.0 pg   MCHC 33.9 31.5 - 35.7 g/dL   RDW 12.0 11.6 - 15.4 %   Platelets 309 150 - 450 x10E3/uL   Neutrophils 62 Not Estab. %   Lymphs 24 Not Estab. %   Monocytes 8 Not Estab. %   Eos 3 Not Estab. %   Basos 1 Not Estab. %   Neutrophils Absolute 7.3 (H) 1.4 - 7.0 x10E3/uL   Lymphocytes Absolute 2.8 0.7 - 3.1 x10E3/uL   Monocytes Absolute 0.9 0.1 - 0.9 x10E3/uL   EOS (ABSOLUTE) 0.3 0.0 - 0.4 x10E3/uL   Basophils Absolute 0.1 0.0 -  0.2 x10E3/uL   Immature Granulocytes 2 Not Estab. %    Immature Grans (Abs) 0.2 (H) 0.0 - 0.1 x10E3/uL  Lipid Panel w/o Chol/HDL Ratio  Result Value Ref Range   Cholesterol, Total 185 100 - 199 mg/dL   Triglycerides 255 (H) 0 - 149 mg/dL   HDL 73 >39 mg/dL   VLDL Cholesterol Cal 41 (H) 5 - 40 mg/dL   LDL Chol Calc (NIH) 71 0 - 99 mg/dL  PSA  Result Value Ref Range   Prostate Specific Ag, Serum 1.8 0.0 - 4.0 ng/mL  TSH  Result Value Ref Range   TSH 1.110 0.450 - 4.500 uIU/mL  Urinalysis, Routine w reflex microscopic  Result Value Ref Range   Specific Gravity, UA 1.015 1.005 - 1.030   pH, UA 5.5 5.0 - 7.5   Color, UA Yellow Yellow   Appearance Ur Clear Clear   Leukocytes,UA Negative Negative   Protein,UA Negative Negative/Trace   Glucose, UA Negative Negative   Ketones, UA Trace (A) Negative   RBC, UA Negative Negative   Bilirubin, UA Negative Negative   Urobilinogen, Ur 0.2 0.2 - 1.0 mg/dL   Nitrite, UA Negative Negative   Microscopic Examination Comment   [id number] 11+Oxyco+Alc+Crt-Bund  Result Value Ref Range   Ethanol Negative Cutoff=0.020 %   Amphetamines, Urine Negative Cutoff=1000 ng/mL   Barbiturate Negative Cutoff=200 ng/mL   BENZODIAZ UR QL Negative Cutoff=200 ng/mL   Cannabinoid Quant, Ur Negative Cutoff=50 ng/mL   Cocaine (Metabolite) Negative Cutoff=300 ng/mL   OPIATE SCREEN URINE Negative Cutoff=300 ng/mL   Oxycodone/Oxymorphone, Urine Negative Cutoff=300 ng/mL   Phencyclidine Negative Cutoff=25 ng/mL   Methadone Screen, Urine Negative Cutoff=300 ng/mL   Propoxyphene Negative Cutoff=300 ng/mL   Meperidine Negative Cutoff=200 ng/mL   Tramadol Negative Cutoff=200 ng/mL   Creatinine 88.2 20.0 - 300.0 mg/dL   pH, Urine 5.7 4.5 - 8.9      Assessment & Plan:   Problem List Items Addressed This Visit   None    Follow up plan: No follow-ups on file.

## 2022-09-07 ENCOUNTER — Encounter: Payer: Self-pay | Admitting: Family Medicine

## 2022-09-07 ENCOUNTER — Ambulatory Visit: Payer: 59 | Admitting: Family Medicine

## 2022-09-07 VITALS — BP 133/83 | HR 88 | Ht 68.0 in | Wt 160.2 lb

## 2022-09-07 DIAGNOSIS — M549 Dorsalgia, unspecified: Secondary | ICD-10-CM | POA: Diagnosis not present

## 2022-09-07 DIAGNOSIS — I10 Essential (primary) hypertension: Secondary | ICD-10-CM

## 2022-09-07 DIAGNOSIS — F410 Panic disorder [episodic paroxysmal anxiety] without agoraphobia: Secondary | ICD-10-CM | POA: Diagnosis not present

## 2022-09-07 MED ORDER — CLONAZEPAM 0.5 MG PO TABS: 0.5000 mg | ORAL_TABLET | Freq: Two times a day (BID) | ORAL | 0 refills | Status: DC | PRN

## 2022-09-07 NOTE — Assessment & Plan Note (Signed)
Under good control on current regimen. Continue current regimen. Continue to monitor. Call with any concerns. Refills given. 60 pills should last at least 3 months.

## 2022-09-07 NOTE — Progress Notes (Signed)
BP 133/83 (BP Location: Left Arm, Cuff Size: Normal)   Pulse 88   Ht 5' 8"$  (1.727 m)   Wt 160 lb 3.2 oz (72.7 kg)   SpO2 98%   BMI 24.36 kg/m    Subjective:    Patient ID: Timothy Finley, male    DOB: June 17, 1971, 52 y.o.   MRN: [id number]  HPI: Timothy Finley is a 52 y.o. male  Chief Complaint  Patient presents with   Anxiety   ANXIETY/STRESS- did not up his buspar. He has not noticed a difference with it.  Duration: chronic Status:exacerbated Anxious mood: yes  Excessive worrying: yes Irritability: yes  Sweating: no Nausea: no Palpitations:no Hyperventilation: no Panic attacks: no Agoraphobia: no  Obscessions/compulsions: yes Depressed mood: yes    09/07/2022    3:07 PM 06/07/2022    2:41 PM 04/27/2022    8:41 AM 01/11/2022   11:27 AM 12/14/2021    1:10 PM  Depression screen PHQ 2/9  Decreased Interest 1 1 0 0 0  Down, Depressed, Hopeless 1 0 1 1 0  PHQ - 2 Score 2 1 1 1 $ 0  Altered sleeping 1 1 1 2 1  $ Tired, decreased energy 0 0 1 1 0  Change in appetite 0 0 0 0 0  Feeling bad or failure about yourself  0 0 1 1 0  Trouble concentrating 0 0 0 1 0  Moving slowly or fidgety/restless 0 0 0 0 0  Suicidal thoughts 0 0 0 0 0  PHQ-9 Score 3 2 4 6 1  $ Difficult doing work/chores Somewhat difficult Somewhat difficult Somewhat difficult     Anhedonia: no Weight changes: no Insomnia: no   Hypersomnia: no Fatigue/loss of energy: no Feelings of worthlessness: no Feelings of guilt: no Impaired concentration/indecisiveness: no Suicidal ideations: no  Crying spells: no Recent Stressors/Life Changes: no   Relationship problems: no   Family stress: no     Financial stress: no    Job stress: no    Recent death/loss: no  Has been having some pain in his upper back. He is concerned it's something deeper because he smokes.   ELEVATED BLOOD PRESSURE Duration of elevated BP: chronic BP monitoring frequency: not checking Previous BP meds: no Recent stressors:  yes Family history of hypertension: yes Recurrent headaches: no Visual changes: no Palpitations: no  Dyspnea: no Chest pain: no Lower extremity edema: no Dizzy/lightheaded: no Transient ischemic attacks: no   Relevant past medical, surgical, family and social history reviewed and updated as indicated. Interim medical history since our last visit reviewed. Allergies and medications reviewed and updated.  Review of Systems  Constitutional: Negative.   HENT: Negative.    Respiratory: Negative.    Cardiovascular: Negative.   Gastrointestinal: Negative.   Musculoskeletal: Negative.   Psychiatric/Behavioral: Negative.      Per HPI unless specifically indicated above     Objective:    BP 133/83 (BP Location: Left Arm, Cuff Size: Normal)   Pulse 88   Ht 5' 8"$  (1.727 m)   Wt 160 lb 3.2 oz (72.7 kg)   SpO2 98%   BMI 24.36 kg/m   Wt Readings from Last 3 Encounters:  09/07/22 160 lb 3.2 oz (72.7 kg)  06/07/22 156 lb 11.2 oz (71.1 kg)  06/05/22 155 lb (70.3 kg)    Physical Exam Vitals and nursing note reviewed.  Constitutional:      General: He is not in acute distress.    Appearance: Normal appearance. He is  normal weight. He is not ill-appearing, toxic-appearing or diaphoretic.  HENT:     Head: Normocephalic and atraumatic.     Right Ear: External ear normal.     Left Ear: External ear normal.     Nose: Nose normal.     Mouth/Throat:     Mouth: Mucous membranes are moist.     Pharynx: Oropharynx is clear.  Eyes:     General: No scleral icterus.       Right eye: No discharge.        Left eye: No discharge.     Extraocular Movements: Extraocular movements intact.     Conjunctiva/sclera: Conjunctivae normal.     Pupils: Pupils are equal, round, and reactive to light.  Cardiovascular:     Rate and Rhythm: Normal rate and regular rhythm.     Pulses: Normal pulses.     Heart sounds: Normal heart sounds. No murmur heard.    No friction rub. No gallop.  Pulmonary:      Effort: Pulmonary effort is normal. No respiratory distress.     Breath sounds: Normal breath sounds. No stridor. No wheezing, rhonchi or rales.  Chest:     Chest wall: No tenderness.  Musculoskeletal:        General: Normal range of motion.     Cervical back: Normal range of motion and neck supple.  Skin:    General: Skin is warm and dry.     Capillary Refill: Capillary refill takes less than 2 seconds.     Coloration: Skin is not jaundiced or pale.     Findings: No bruising, erythema, lesion or rash.  Neurological:     General: No focal deficit present.     Mental Status: He is alert and oriented to person, place, and time. Mental status is at baseline.  Psychiatric:        Mood and Affect: Mood normal.        Behavior: Behavior normal.        Thought Content: Thought content normal.        Judgment: Judgment normal.     Results for orders placed or performed in visit on 06/07/22  Comprehensive metabolic panel  Result Value Ref Range   Glucose 94 70 - 99 mg/dL   BUN 7 6 - 24 mg/dL   Creatinine, Ser 0.85 0.76 - 1.27 mg/dL   eGFR 106 >59 mL/min/1.73   BUN/Creatinine Ratio 8 (L) 9 - 20   Sodium 144 134 - 144 mmol/L   Potassium 4.2 3.5 - 5.2 mmol/L   Chloride 102 96 - 106 mmol/L   CO2 25 20 - 29 mmol/L   Calcium 9.8 8.7 - 10.2 mg/dL   Total Protein 7.4 6.0 - 8.5 g/dL   Albumin 5.0 4.1 - 5.1 g/dL   Globulin, Total 2.4 1.5 - 4.5 g/dL   Albumin/Globulin Ratio 2.1 1.2 - 2.2   Bilirubin Total 0.5 0.0 - 1.2 mg/dL   Alkaline Phosphatase 101 44 - 121 IU/L   AST 25 0 - 40 IU/L   ALT 54 (H) 0 - 44 IU/L  CBC with Differential/Platelet  Result Value Ref Range   WBC 11.5 (H) 3.4 - 10.8 x10E3/uL   RBC 4.80 4.14 - 5.80 x10E6/uL   Hemoglobin 15.4 13.0 - 17.7 g/dL   Hematocrit 45.4 37.5 - 51.0 %   MCV 95 79 - 97 fL   MCH 32.1 26.6 - 33.0 pg   MCHC 33.9 31.5 - 35.7 g/dL   RDW  12.0 11.6 - 15.4 %   Platelets 309 150 - 450 x10E3/uL   Neutrophils 62 Not Estab. %   Lymphs 24 Not Estab.  %   Monocytes 8 Not Estab. %   Eos 3 Not Estab. %   Basos 1 Not Estab. %   Neutrophils Absolute 7.3 (H) 1.4 - 7.0 x10E3/uL   Lymphocytes Absolute 2.8 0.7 - 3.1 x10E3/uL   Monocytes Absolute 0.9 0.1 - 0.9 x10E3/uL   EOS (ABSOLUTE) 0.3 0.0 - 0.4 x10E3/uL   Basophils Absolute 0.1 0.0 - 0.2 x10E3/uL   Immature Granulocytes 2 Not Estab. %   Immature Grans (Abs) 0.2 (H) 0.0 - 0.1 x10E3/uL  Lipid Panel w/o Chol/HDL Ratio  Result Value Ref Range   Cholesterol, Total 185 100 - 199 mg/dL   Triglycerides 255 (H) 0 - 149 mg/dL   HDL 73 >39 mg/dL   VLDL Cholesterol Cal 41 (H) 5 - 40 mg/dL   LDL Chol Calc (NIH) 71 0 - 99 mg/dL  PSA  Result Value Ref Range   Prostate Specific Ag, Serum 1.8 0.0 - 4.0 ng/mL  TSH  Result Value Ref Range   TSH 1.110 0.450 - 4.500 uIU/mL  Urinalysis, Routine w reflex microscopic  Result Value Ref Range   Specific Gravity, UA 1.015 1.005 - 1.030   pH, UA 5.5 5.0 - 7.5   Color, UA Yellow Yellow   Appearance Ur Clear Clear   Leukocytes,UA Negative Negative   Protein,UA Negative Negative/Trace   Glucose, UA Negative Negative   Ketones, UA Trace (A) Negative   RBC, UA Negative Negative   Bilirubin, UA Negative Negative   Urobilinogen, Ur 0.2 0.2 - 1.0 mg/dL   Nitrite, UA Negative Negative   Microscopic Examination Comment   [id number]+Oxyco+Alc+Crt-Bund  Result Value Ref Range   Ethanol Negative Cutoff=0.020 %   Amphetamines, Urine Negative Cutoff=1000 ng/mL   Barbiturate Negative Cutoff=200 ng/mL   BENZODIAZ UR QL Negative Cutoff=200 ng/mL   Cannabinoid Quant, Ur Negative Cutoff=50 ng/mL   Cocaine (Metabolite) Negative Cutoff=300 ng/mL   OPIATE SCREEN URINE Negative Cutoff=300 ng/mL   Oxycodone/Oxymorphone, Urine Negative Cutoff=300 ng/mL   Phencyclidine Negative Cutoff=25 ng/mL   Methadone Screen, Urine Negative Cutoff=300 ng/mL   Propoxyphene Negative Cutoff=300 ng/mL   Meperidine Negative Cutoff=200 ng/mL   Tramadol Negative Cutoff=200 ng/mL    Creatinine 88.2 20.0 - 300.0 mg/dL   pH, Urine 5.7 4.5 - 8.9      Assessment & Plan:   Problem List Items Addressed This Visit       Other   Panic disorder - Primary    Under good control on current regimen. Continue current regimen. Continue to monitor. Call with any concerns. Refills given. 60 pills should last at least 3 months.        Other Visit Diagnoses     Upper back pain       Will check CXR, likely muscular. Call with any concerns.   Relevant Orders   DG Chest 2 View   Elevated blood pressure reading in office with diagnosis of hypertension       BP better on recheck. Will check microalbumin. Await results.   Relevant Orders   Urine Microalbumin w/creat. ratio        Follow up plan: Return in about 3 months (around 12/06/2022).

## 2022-09-10 LAB — MICROALBUMIN / CREATININE URINE RATIO
Creatinine, Urine: 36.8 mg/dL
Microalb/Creat Ratio: <8 mg/g creat (ref 0–29)
Microalbumin, Urine: <3 ug/mL

## 2022-10-04 ENCOUNTER — Other Ambulatory Visit: Payer: Self-pay | Admitting: Family Medicine

## 2022-10-04 NOTE — Telephone Encounter (Signed)
Requested Prescriptions  Pending Prescriptions Disp Refills   busPIRone (BUSPAR) 5 MG tablet [Pharmacy Med Name: BUSPIRONE HCL 5 MG TABLET] 180 tablet 1    Sig: TAKE 1-2 TABLETS (5-10 MG TOTAL) BY MOUTH 2 (TWO) TIMES DAILY.     Psychiatry: Anxiolytics/Hypnotics - Non-controlled Passed - 10/04/2022 10:30 AM      Passed - Valid encounter within last 12 months    Recent Outpatient Visits           3 weeks ago Panic disorder   [place], Alianza P, DO   3 months ago Routine general medical examination at a health care facility   [facility], [place], DO   5 months ago Umbilical hernia without obstruction and without gangrene   [place], Megan P, DO   8 months ago Panic disorder   [place], [place], DO   9 months ago Panic disorder   [place], [place], DO

## 2022-12-22 ENCOUNTER — Other Ambulatory Visit: Payer: Self-pay | Admitting: Family Medicine

## 2022-12-25 NOTE — Telephone Encounter (Signed)
Requested Prescriptions  Pending Prescriptions Disp Refills   rosuvastatin (CRESTOR) 40 MG tablet [Pharmacy Med Name: ROSUVASTATIN CALCIUM 40 MG TAB] 30 tablet 5    Sig: TAKE 1 TABLET BY MOUTH EVERY DAY     Cardiovascular:  Antilipid - Statins 2 Failed - 12/22/2022  8:52 AM      Failed - Lipid Panel in normal range within the last 12 months    Cholesterol, Total  Date Value Ref Range Status  06/07/2022 185 100 - 199 mg/dL Final   LDL Chol Calc (NIH)  Date Value Ref Range Status  06/07/2022 71 0 - 99 mg/dL Final   HDL  Date Value Ref Range Status  06/07/2022 73 >39 mg/dL Final   Triglycerides  Date Value Ref Range Status  06/07/2022 255 (H) 0 - 149 mg/dL Final         Passed - Cr in normal range and within 360 days    Creatinine  Date Value Ref Range Status  06/07/2022 88.2 20.0 - 300.0 mg/dL Final  16/04/9603 5.40 0.60 - 1.30 mg/dL Final   Creatinine, Ser  Date Value Ref Range Status  06/07/2022 0.85 0.76 - 1.27 mg/dL Final         Passed - Patient is not pregnant      Passed - Valid encounter within last 12 months    Recent Outpatient Visits           3 months ago Panic disorder   [facility] [place], Megan P, DO   6 months ago Routine general medical examination at a health care facility   [facility], [place] P, DO   8 months ago Umbilical hernia without obstruction and without gangrene   [facility] [place], Megan P, DO   11 months ago Panic disorder   [place] [facility] [place], Megan P, DO   1 year ago Panic disorder    [facility] [place], [place], DO

## 2023-02-19 ENCOUNTER — Ambulatory Visit: Payer: 59 | Admitting: Physician Assistant

## 2023-02-19 ENCOUNTER — Encounter: Payer: Self-pay | Admitting: Physician Assistant

## 2023-02-19 VITALS — BP 130/85 | HR 93 | Ht 68.0 in | Wt 159.6 lb

## 2023-02-19 DIAGNOSIS — M549 Dorsalgia, unspecified: Secondary | ICD-10-CM

## 2023-02-19 DIAGNOSIS — F410 Panic disorder [episodic paroxysmal anxiety] without agoraphobia: Secondary | ICD-10-CM | POA: Diagnosis not present

## 2023-02-19 MED ORDER — CLONAZEPAM 0.5 MG PO TABS: 0.5000 mg | ORAL_TABLET | Freq: Two times a day (BID) | ORAL | 0 refills | Status: DC | PRN

## 2023-02-19 MED ORDER — BUSPIRONE HCL 5 MG PO TABS: 5.0000 mg | ORAL_TABLET | Freq: Two times a day (BID) | ORAL | 1 refills | Status: DC

## 2023-02-19 NOTE — Progress Notes (Unsigned)
Acute Office Visit   Patient: Timothy Finley   DOB: 13-May-1971   52 y.o. Male  MRN: [id number] Visit Date: 02/19/2023  Today's healthcare provider: Oswaldo Conroy Ever Halberg, PA-C  Introduced myself to the patient as a Secondary school teacher and provided education on APPs in clinical practice.    Chief Complaint  Patient presents with   Back Pain    Patient says this has been an ongoing issues with his upper back and not sure if it is muscular. Patient says the pain is in the mid shoulder blade area. Patient says the pain comes and goes. Patient has discussed imaging in the past with Dr Laural Benes, but he put it off so long and would like to discuss going now as the issue at hand has gotten worse.    Subjective    HPI HPI     Back Pain    Additional comments: Patient says this has been an ongoing issues with his upper back and not sure if it is muscular. Patient says the pain is in the mid shoulder blade area. Patient says the pain comes and goes. Patient has discussed imaging in the past with Dr Laural Benes, but he put it off so long and would like to discuss going now as the issue at hand has gotten worse.       Last edited by Malen Gauze, CMA on 02/19/2023  2:42 PM.       Upper back pain  Onset:gradual  Duration: several months - maybe since last year  Location: upper back, between shoulder blades  Radiation: none  Pain level and character: reports it is intermittent, and feels like tightness, usually a 1/10 at the most  Other associated symptoms: He reports some intermittent tingling in left finger tips but does not seem to coincide with back pain flares  Interventions: nothing  Alleviating: stretching sometimes makes it better, used to go to chiropractor  Aggravating: unsure    Patient reports he has been having increased anxiety attacks  He is requesting a refill of his Klonopin  He reports he has recently started consuming alcohol again and has noticed an uptick in his anxiety since  resuming use We discussed dangers of combining these types of medications and alcohol use  Recommend he increases his Buspar to twice per day and only uses Klonopin PRN     Medications: Outpatient Medications Prior to Visit  Medication Sig   albuterol (PROVENTIL) (2.5 MG/3ML) 0.083% nebulizer solution Take 3 mLs (2.5 mg total) by nebulization every 6 (six) hours as needed for wheezing or shortness of breath.   albuterol (VENTOLIN HFA) 108 (90 Base) MCG/ACT inhaler Inhale 1-2 puffs into the lungs every 6 (six) hours as needed for wheezing or shortness of breath.   cetirizine (ZYRTEC) 10 MG tablet Take 10 mg by mouth at bedtime.   fluticasone (FLONASE) 50 MCG/ACT nasal spray Place 2 sprays into both nostrils daily.   fluticasone-salmeterol (WIXELA INHUB) 250-50 MCG/ACT AEPB INHALE 1 PUFF INTO THE LUNGS TWICE A DAY   rosuvastatin (CRESTOR) 40 MG tablet TAKE 1 TABLET BY MOUTH EVERY DAY   triamcinolone ointment (KENALOG) 0.5 % Apply 1 application topically 2 (two) times daily.   [DISCONTINUED] busPIRone (BUSPAR) 5 MG tablet TAKE 1-2 TABLETS (5-10 MG TOTAL) BY MOUTH 2 (TWO) TIMES DAILY.   [DISCONTINUED] clonazePAM (KLONOPIN) 0.5 MG tablet Take 1 tablet (0.5 mg total) by mouth 2 (two) times daily as needed for anxiety.   No  facility-administered medications prior to visit.    Review of Systems  Musculoskeletal:  Positive for back pain and myalgias. Negative for neck pain and neck stiffness.  Neurological:  Negative for tremors, syncope, weakness and numbness.         Objective    BP 130/85   Pulse 93   Ht 5\' 8"  (1.727 m)   Wt 159 lb 9.6 oz (72.4 kg)   SpO2 99%   BMI 24.27 kg/m      Physical Exam Vitals reviewed.  Constitutional:      General: He is awake.     Appearance: Normal appearance. He is well-developed and well-groomed.  HENT:     Head: Normocephalic and atraumatic.  Pulmonary:     Effort: Pulmonary effort is normal.  Musculoskeletal:     Right shoulder: Normal.      Left shoulder: Normal.     Right upper arm: Normal.     Left upper arm: Normal.     Cervical back: Normal and normal range of motion. No swelling, edema or erythema. Normal range of motion.     Thoracic back: Normal. No swelling, edema, deformity or tenderness. Normal range of motion.     Comments: Normal ROM with regard to shoulders, thoracic ROM - lateral rotation and flexion is intact and symmetrical  Neurological:     Mental Status: He is alert.  Psychiatric:        Behavior: Behavior is cooperative.       No results found for any visits on 02/19/23.  Assessment & Plan      No follow-ups on file.       Problem List Items Addressed This Visit       Other   Panic disorder    Chronic, recurrent, appears currently exacerbated at this time Has been using BuSpar 5 mg p.o. daily for the past few months but reports that this is not providing adequate relief.  He is asking for refill of his Klonopin at this time. He reports that he is recently started drinking again and has noticed that this is correlated with an increase in his anxiety levels and instances of panic attacks Reviewed that he can increase his BuSpar to twice per day if needed.  Will provide refill of clonazepam 0.5 mg p.o. daily as needed.  Reviewed that this should not be mixed with alcohol or other sedating substances. Follow-up as needed for persistent or progressing symptoms      Relevant Medications   clonazePAM (KLONOPIN) 0.5 MG tablet   busPIRone (BUSPAR) 5 MG tablet   Upper back pain - Primary    Appears to likely be chronic based on chart review, acutely exacerbated at this time. He reports midline thoracic back pain, between shoulder blades, is mostly sore in nature and intermittent. Suspect this is likely muscular in etiology but given that this has been a recurrent concern we will get thoracic imaging along with previously ordered chest x-ray.  Results to dictate further management Recommend  conservative measures at this time which include: Warm compresses, alternating ibuprofen and Tylenol as needed, stretches, gentle massage. Follow-up as needed for progressing or persistent symptoms      Relevant Orders   DG Thoracic Spine W/Swimmers     No follow-ups on file.   I, Deirdre Gryder E Manus Weedman, PA-C, have reviewed all documentation for this visit. The documentation on 02/20/23 for the exam, diagnosis, procedures, and orders are all accurate and complete.   Veleda Mun, MHS, PA-C [facility]

## 2023-02-19 NOTE — Patient Instructions (Addendum)
Please go to [facility] for your xrays- go to the check in desk and let them know you are there for imaging   [address] [place], [place] [address]  [phone]  I recommend the following at this time to help relieve that discomfort:  Rest Warm compresses to the area (20 minutes on, minimum of 30 minutes off) You can alternate Tylenol and Ibuprofen for pain management but Ibuprofen is typically preferred to reduce inflammation.  Tylenol is preferred due to your kidney function Gentle stretches and exercises that I have included in your paperwork Try to reduce excess strain to the area and rest as much as possible  Wear supportive shoes and, if you must lift anything, use proper lifting techniques that spare your back.   If these measures do not lead to improvement in your symptoms over the next 2-4 weeks please let us know    At this time I recommend increasing your Buspar to twice per day- please only use the Klonopin as needed for intense anxiety attacks and do not combine with alcohol use.

## 2023-02-20 DIAGNOSIS — M549 Dorsalgia, unspecified: Secondary | ICD-10-CM | POA: Insufficient documentation

## 2023-02-20 NOTE — Assessment & Plan Note (Signed)
Appears to likely be chronic based on chart review, acutely exacerbated at this time. He reports midline thoracic back pain, between shoulder blades, is mostly sore in nature and intermittent. Suspect this is likely muscular in etiology but given that this has been a recurrent concern we will get thoracic imaging along with previously ordered chest x-ray.  Results to dictate further management Recommend conservative measures at this time which include: Warm compresses, alternating ibuprofen and Tylenol as needed, stretches, gentle massage. Follow-up as needed for progressing or persistent symptoms

## 2023-02-20 NOTE — Assessment & Plan Note (Signed)
Chronic, recurrent, appears currently exacerbated at this time Has been using BuSpar 5 mg p.o. daily for the past few months but reports that this is not providing adequate relief.  He is asking for refill of his Klonopin at this time. He reports that he is recently started drinking again and has noticed that this is correlated with an increase in his anxiety levels and instances of panic attacks Reviewed that he can increase his BuSpar to twice per day if needed.  Will provide refill of clonazepam 0.5 mg p.o. daily as needed.  Reviewed that this should not be mixed with alcohol or other sedating substances. Follow-up as needed for persistent or progressing symptoms

## 2023-03-04 ENCOUNTER — Ambulatory Visit
Admission: RE | Admit: 2023-03-04 | Discharge: 2023-03-04 | Disposition: A | Discharge status: Home / Self Care | Payer: 59 | Source: Home / Self Care | Attending: Family Medicine | Admitting: Family Medicine

## 2023-03-04 ENCOUNTER — Ambulatory Visit
Admission: RE | Admit: 2023-03-04 | Discharge: 2023-03-04 | Disposition: A | Discharge status: Home / Self Care | Payer: 59 | Source: Ambulatory Visit | Attending: Physician Assistant | Admitting: Physician Assistant

## 2023-03-04 ENCOUNTER — Ambulatory Visit
Admission: RE | Admit: 2023-03-04 | Discharge: 2023-03-04 | Disposition: A | Discharge status: Home / Self Care | Payer: 59 | Attending: Physician Assistant | Admitting: Physician Assistant

## 2023-03-04 ENCOUNTER — Ambulatory Visit
Admission: RE | Admit: 2023-03-04 | Discharge: 2023-03-04 | Disposition: A | Discharge status: Home / Self Care | Payer: 59 | Source: Ambulatory Visit | Attending: Family Medicine | Admitting: Family Medicine

## 2023-03-04 DIAGNOSIS — F17200 Nicotine dependence, unspecified, uncomplicated: Secondary | ICD-10-CM | POA: Diagnosis not present

## 2023-03-04 DIAGNOSIS — M549 Dorsalgia, unspecified: Secondary | ICD-10-CM | POA: Insufficient documentation

## 2023-03-04 DIAGNOSIS — M546 Pain in thoracic spine: Secondary | ICD-10-CM | POA: Diagnosis not present

## 2023-03-06 ENCOUNTER — Other Ambulatory Visit: Payer: Self-pay | Admitting: Family Medicine

## 2023-03-06 DIAGNOSIS — Z1211 Encounter for screening for malignant neoplasm of colon: Secondary | ICD-10-CM

## 2023-03-08 NOTE — Progress Notes (Signed)
Your chest xray and thoracic spine xray did not show concerning findings of your lungs or heart. 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 This is usually managed conservatively but if it is becoming more of a concern we can refer you to a spine specialist.

## 2023-03-13 ENCOUNTER — Encounter: Payer: Self-pay | Admitting: Family Medicine

## 2023-03-21 ENCOUNTER — Ambulatory Visit: Payer: 59 | Admitting: Family Medicine

## 2023-04-04 ENCOUNTER — Other Ambulatory Visit: Payer: Self-pay | Admitting: Physician Assistant

## 2023-04-04 DIAGNOSIS — F410 Panic disorder [episodic paroxysmal anxiety] without agoraphobia: Secondary | ICD-10-CM

## 2023-04-05 NOTE — Telephone Encounter (Signed)
Request is too soon.  Requested Prescriptions  Pending Prescriptions Disp Refills   busPIRone (BUSPAR) 5 MG tablet [Pharmacy Med Name: BUSPIRONE HCL 5 MG TABLET] 360 tablet 1    Sig: TAKE 1-2 TABLETS (5-10 MG TOTAL) BY MOUTH 2 (TWO) TIMES DAILY.     Psychiatry: Anxiolytics/Hypnotics - Non-controlled Passed - 04/04/2023  2:32 PM      Passed - Valid encounter within last 12 months    Recent Outpatient Visits           1 month ago Upper back pain   [place] [facility] [place], Oswaldo Conroy, PA-C   7 months ago Panic disorder   [place] [facility] [place], Megan P, DO   10 months ago Routine general medical examination at a health care facility   [facility], [place] P, DO   11 months ago Umbilical hernia without obstruction and without gangrene   [place] [facility] [place], Megan P, DO   1 year ago Panic disorder   [place] [facility] [place], [place], DO

## 2023-04-23 ENCOUNTER — Ambulatory Visit: Payer: Self-pay

## 2023-04-23 NOTE — Telephone Encounter (Signed)
  Chief Complaint: poison oak Symptoms: poison oak rash on wrist, elbow, leg, and in between toes, painful and red, itching Frequency: 2 weeks  Pertinent Negatives: NA Disposition: [] ED /[] Urgent Care (no appt availability in office) / [x] Appointment(In office/virtual)/ []  [facility]/ [] Home Care/ [] Refused Recommended Disposition /[] [facility]/ []  Follow-up with PCP Additional Notes: pt is requesting rx for prednisone d/t having poison oak. He states he has tried OTC treatment but nothing helps. Scheduled VV tomorrow at 0940 with Denny Peon, [place].   Reason for Disposition  Large blisters or oozing sores  Answer Assessment - Initial Assessment Questions 1. APPEARANCE of RASH: "Describe the rash."      Small red spots  2. LOCATION: "Where is the rash located?"  (e.g., face, genitals, hands, legs)     Started on wrist, elbows, leg, in between toes  3. SIZE: "How large is the rash?"      Larger on toes  4. ONSET: "When did the rash begin?"      2 weeks  5. ITCHING: "Does the rash itch?" If Yes, ask: "How bad is it?"   - MILD - doesn't interfere with normal activities   - MODERATE-SEVERE: interferes with work, school, sleep, or other activities      Yes  6. EXPOSURE:  "How were you exposed to the plant (poison ivy, poison oak, sumac)"  "When were you exposed?"       3 Saturdays  7. PAST HISTORY: "Have you had a poison ivy rash before?" If Yes, ask: "How bad was it?"     Has had since child  Protocols used: Poison Ivy - Oak - [facility]

## 2023-04-24 ENCOUNTER — Telehealth (INDEPENDENT_AMBULATORY_CARE_PROVIDER_SITE_OTHER): Payer: 59 | Admitting: Physician Assistant

## 2023-04-24 DIAGNOSIS — L237 Allergic contact dermatitis due to plants, except food: Secondary | ICD-10-CM | POA: Diagnosis not present

## 2023-04-24 MED ORDER — PREDNISONE 20 MG PO TABS: ORAL_TABLET | ORAL | 0 refills | Status: DC

## 2023-04-24 NOTE — Patient Instructions (Signed)
Based on the physical exam today and your answers to my questions I believe you have a condition called allergic contact dermatitis from coming into contact with an irritant (likely poison oak, ivy, sumac, etc)  In order to help you feel better I recommend the following:  Using a topical treatment such as Calamine lotion, Benadryl cream, Cortisone cream as needed Avoiding scratching the rash as this can lead to secondary infection Gently cleanse the skin with warm water and gentle soap- no harsh abrasives or scrubbing is needed  I am sending in a script for a Prednisone taper. This taper is longer than most to help [place] avoid rebound symptoms from discontinuing too quickly. Steroids can cause the following: sleeplessness, increased appetite, elevated blood sugars, and increased irritability. I recommend taking it in the morning to prevent trouble sleeping and try to make changes to your diet to avoid excess sugar if you are diabetic   You can take an over the counter antihistamine such as Allegra, Zyrtec or Claritin during the day to help with the symptoms If you want  you can take a benadryl at night to help with sleep and itching but it can cause drowsiness so be mindful while using  If you may be in the same area that you were exposed to the plant I recommend the following to reduce further irritation Wear long sleeves and pants with closed shoes and cover your face. Use protective eye-wear to prevent eye contact Once finished in the area with the plant, strip your clothing and place it immediately in the wash to be cleaned on the hottest setting with detergent Take a shower and be sure to wash thoroughly to prevent any of the plant oils from lingering on your skin (if you are exposed again without protective clothing- wash your skin immediately with soap and water)  If you think any pets may have been exposed to these plants they will need to be bathed as well. The oils can stay on their hair  for some time and lead to further exposure and rash for you.   Please let us know if you have any further questions or concerns.

## 2023-04-24 NOTE — Progress Notes (Signed)
     Virtual Visit via Video Note  I connected with Timothy Finley on 04/24/23 at  9:40 AM EDT by a video enabled telemedicine application and verified that I am speaking with the correct person using two identifiers.   Today's Provider: Jacquelin Hawking, MHS, PA-C Introduced myself to the patient as a PA-C and provided education on APPs in clinical practice.   Location: Patient: at home  Provider: [facility], [place], [place]   I discussed the limitations of evaluation and management by telemedicine and the availability of in person appointments. The patient expressed understanding and agreed to proceed.   Chief Complaint  Patient presents with   Poison Ivy    Started wrist, has spreaded over to toes. Onset since 2 weeks, very painful     History of Present Illness:   Poison Ivy dermatitis concerns  He reports concerns for poison ivy  States he has had it for about 2 weeks and has noticed new areas of involvement popping up  Reports rash along wrist, toes, ankles, waist  He denies trouble breathing, wheezing, tongue swelling since being exposed He is not sure if he is still getting exposed or if this is all from same exposure    Review of Systems  Skin:  Positive for itching and rash.      Observations/Objective:   Due to the nature of the virtual visit, physical exam and observations are limited. Able to obtain the following observations:   Alert, oriented, x3  Appears comfortable, in no acute distress.  No scleral injection, no appreciated hoarseness, tachypnea, wheeze or strider. Able to maintain conversation without visible strain.  No cough appreciated during visit.  Rash observed during visit on toes and interdigital spaces of toes of left foot   Assessment and Plan:  Problem List Items Addressed This Visit   None Visit Diagnoses     Allergic contact dermatitis due to urushiol from [place] poison ivy    -  Primary Acute, new concern Patient  reports he was exposed to poison ivy/oak/sumac several weeks ago and is having new affected areas pop up every few days that are itching Limited exam was consistent with dermatitis from urushiol exposure  Will provide prolonged prednisone taper to assist with resolution and itching  Reviewed OTC products for relief Reviewed return precautions Follow up as needed for persistent or progressing symptoms     Relevant Medications   predniSONE (DELTASONE) 20 MG tablet       Follow Up Instructions:    I discussed the assessment and treatment plan with the patient. The patient was provided an opportunity to ask questions and all were answered. The patient agreed with the plan and demonstrated an understanding of the instructions.   The patient was advised to call back or seek an in-person evaluation if the symptoms worsen or if the condition fails to improve as anticipated.  I provided 7 minutes of non-face-to-face time during this encounter.  No follow-ups on file.   I, Corie Vavra E Amery Minasyan, PA-C, have reviewed all documentation for this visit. The documentation on 04/24/23 for the exam, diagnosis, procedures, and orders are all accurate and complete.   Jacquelin Hawking, MHS, PA-C [facility]

## 2023-05-02 ENCOUNTER — Other Ambulatory Visit: Payer: Self-pay | Admitting: Family Medicine

## 2023-05-02 DIAGNOSIS — F410 Panic disorder [episodic paroxysmal anxiety] without agoraphobia: Secondary | ICD-10-CM

## 2023-05-02 NOTE — Telephone Encounter (Signed)
Request is too soon,last refill 02/19/23 for 90 and 1 refill.  Requested Prescriptions  Pending Prescriptions Disp Refills   busPIRone (BUSPAR) 5 MG tablet [Pharmacy Med Name: BUSPIRONE HCL 5 MG TABLET] 180 tablet 2    Sig: TAKE 1-2 TABLETS (5-10 MG TOTAL) BY MOUTH 2 (TWO) TIMES DAILY.     Psychiatry: Anxiolytics/Hypnotics - Non-controlled Passed - 05/02/2023 11:34 AM      Passed - Valid encounter within last 12 months    Recent Outpatient Visits           1 week ago Allergic contact dermatitis due to urushiol from [place] poison ivy   [place] [facility] Mecum, Oswaldo Conroy, PA-C   2 months ago Upper back pain   [place] [facility] Mecum, Oswaldo Conroy, PA-C   7 months ago Panic disorder   [place] [facility] [place], Megan P, DO   10 months ago Routine general medical examination at a health care facility   [place] [facility] [place], [place] P, DO   1 year ago Umbilical hernia without obstruction and without gangrene   [place] [facility] [place], Megan P, DO

## 2023-05-06 ENCOUNTER — Other Ambulatory Visit: Payer: Self-pay | Admitting: Family Medicine

## 2023-05-06 DIAGNOSIS — F410 Panic disorder [episodic paroxysmal anxiety] without agoraphobia: Secondary | ICD-10-CM

## 2023-05-06 NOTE — Telephone Encounter (Signed)
Request is too soon for refill, duplicate request.  Requested Prescriptions  Pending Prescriptions Disp Refills   busPIRone (BUSPAR) 5 MG tablet [Pharmacy Med Name: BUSPIRONE HCL 5 MG TABLET] 60 tablet 5    Sig: TAKE 1-2 TABLETS (5-10 MG TOTAL) BY MOUTH 2 (TWO) TIMES DAILY.     Psychiatry: Anxiolytics/Hypnotics - Non-controlled Passed - 05/06/2023  1:17 AM      Passed - Valid encounter within last 12 months    Recent Outpatient Visits           1 week ago Allergic contact dermatitis due to urushiol from [place] poison ivy   [place] [facility] Mecum, Oswaldo Conroy, PA-C   2 months ago Upper back pain   [place] [facility] Mecum, Oswaldo Conroy, PA-C   8 months ago Panic disorder   [place] [facility] [place], Megan P, DO   11 months ago Routine general medical examination at a health care facility   [facility] [place], [place] P, DO   1 year ago Umbilical hernia without obstruction and without gangrene   [place] [place], Megan P, DO

## 2023-05-16 ENCOUNTER — Other Ambulatory Visit: Payer: Self-pay | Admitting: Physician Assistant

## 2023-05-16 DIAGNOSIS — F410 Panic disorder [episodic paroxysmal anxiety] without agoraphobia: Secondary | ICD-10-CM

## 2023-05-16 NOTE — Telephone Encounter (Signed)
Requested Prescriptions  Pending Prescriptions Disp Refills   busPIRone (BUSPAR) 5 MG tablet [Pharmacy Med Name: BUSPIRONE HCL 5 MG TABLET] 180 tablet 1    Sig: TAKE 1-2 TABLETS (5-10 MG TOTAL) BY MOUTH 2 (TWO) TIMES DAILY.     Psychiatry: Anxiolytics/Hypnotics - Non-controlled Passed - 05/16/2023  1:34 PM      Passed - Valid encounter within last 12 months    Recent Outpatient Visits           3 weeks ago Allergic contact dermatitis due to urushiol from [place] poison ivy   [place] [facility] Mecum, Oswaldo Conroy, PA-C   2 months ago Upper back pain   [place] [facility] Mecum, Oswaldo Conroy, PA-C   8 months ago Panic disorder   [place] [facility] [place], Megan P, DO   11 months ago Routine general medical examination at a health care facility   [facility] [place], [place] P, DO   1 year ago Umbilical hernia without obstruction and without gangrene   [facility] [place], Megan P, DO

## 2023-06-14 ENCOUNTER — Other Ambulatory Visit: Payer: Self-pay | Admitting: Family Medicine

## 2023-06-14 DIAGNOSIS — F410 Panic disorder [episodic paroxysmal anxiety] without agoraphobia: Secondary | ICD-10-CM

## 2023-06-17 NOTE — Telephone Encounter (Signed)
Last RF 05/16/23 #180 1 RF  Requested Prescriptions  Refused Prescriptions Disp Refills   busPIRone (BUSPAR) 5 MG tablet [Pharmacy Med Name: BUSPIRONE HCL 5 MG TABLET] 180 tablet 2    Sig: TAKE 1-2 TABLETS (5-10 MG TOTAL) BY MOUTH 2 (TWO) TIMES DAILY.     Psychiatry: Anxiolytics/Hypnotics - Non-controlled Passed - 06/14/2023  1:32 PM      Passed - Valid encounter within last 12 months    Recent Outpatient Visits           1 month ago Allergic contact dermatitis due to urushiol from [place] poison ivy   [place] [facility] Mecum, Oswaldo Conroy, PA-C   3 months ago Upper back pain   [place] [facility] Mecum, Oswaldo Conroy, PA-C   9 months ago Panic disorder   [place] [facility] [place], Megan P, DO   1 year ago Routine general medical examination at a health care facility   [facility] [place], [place] P, DO   1 year ago Umbilical hernia without obstruction and without gangrene   [place] [facility] [place], Megan P, DO

## 2023-06-18 ENCOUNTER — Other Ambulatory Visit: Payer: Self-pay | Admitting: Family Medicine

## 2023-06-19 NOTE — Telephone Encounter (Signed)
Requested medication (s) are due for refill today: Yes  Requested medication (s) are on the active medication list: Yes  Last refill:  12/25/22 #30, 5RF  Future visit scheduled: No  Notes to clinic:  Unable to refill per protocol due to failed labs, no updated results.      Requested Prescriptions  Pending Prescriptions Disp Refills   rosuvastatin (CRESTOR) 40 MG tablet [Pharmacy Med Name: ROSUVASTATIN CALCIUM 40 MG TAB] 30 tablet 5    Sig: TAKE 1 TABLET BY MOUTH EVERY DAY     Cardiovascular:  Antilipid - Statins 2 Failed - 06/18/2023  1:35 AM      Failed - Cr in normal range and within 360 days    Creatinine  Date Value Ref Range Status  06/07/2022 88.2 20.0 - 300.0 mg/dL Final  47/82/9562 1.30 0.60 - 1.30 mg/dL Final   Creatinine, Ser  Date Value Ref Range Status  06/07/2022 0.85 0.76 - 1.27 mg/dL Final         Failed - Lipid Panel in normal range within the last 12 months    Cholesterol, Total  Date Value Ref Range Status  06/07/2022 185 100 - 199 mg/dL Final   LDL Chol Calc (NIH)  Date Value Ref Range Status  06/07/2022 71 0 - 99 mg/dL Final   HDL  Date Value Ref Range Status  06/07/2022 73 >39 mg/dL Final   Triglycerides  Date Value Ref Range Status  06/07/2022 255 (H) 0 - 149 mg/dL Final         Passed - Patient is not pregnant      Passed - Valid encounter within last 12 months    Recent Outpatient Visits           1 month ago Allergic contact dermatitis due to urushiol from [place] poison ivy   [place] [facility] Mecum, Oswaldo Conroy, PA-C   4 months ago Upper back pain   [place] [facility] Mecum, Oswaldo Conroy, PA-C   9 months ago Panic disorder   [place] [facility] [place], Megan P, DO   1 year ago Routine general medical examination at a health care facility   [facility] [place], [place] P, DO   1 year ago Umbilical hernia without obstruction and without gangrene   [place]  [facility] [place], Megan P, DO

## 2023-06-20 NOTE — Telephone Encounter (Signed)
Attempted to reach patient, LVM to call office back to schedule appointment and provider would send enough meds in until his appointment.  Put in CRM.

## 2023-06-20 NOTE — Telephone Encounter (Signed)
Needs an appointment. Will get him enough medicine to make it to appointment when it's booked.   

## 2023-07-05 ENCOUNTER — Encounter: Payer: Self-pay | Admitting: Physician Assistant

## 2023-07-05 ENCOUNTER — Ambulatory Visit: Payer: Self-pay | Admitting: *Deleted

## 2023-07-05 ENCOUNTER — Ambulatory Visit: Payer: 59 | Admitting: Physician Assistant

## 2023-07-05 VITALS — BP 132/78 | HR 105 | Temp 98.2°F | Resp 16 | Ht 68.0 in | Wt 165.0 lb

## 2023-07-05 DIAGNOSIS — R509 Fever, unspecified: Secondary | ICD-10-CM | POA: Diagnosis not present

## 2023-07-05 DIAGNOSIS — R059 Cough, unspecified: Secondary | ICD-10-CM

## 2023-07-05 DIAGNOSIS — J351 Hypertrophy of tonsils: Secondary | ICD-10-CM | POA: Diagnosis not present

## 2023-07-05 DIAGNOSIS — R6889 Other general symptoms and signs: Secondary | ICD-10-CM

## 2023-07-05 DIAGNOSIS — J029 Acute pharyngitis, unspecified: Secondary | ICD-10-CM

## 2023-07-05 LAB — POCT INFLUENZA A/B
Influenza A, POC: NEGATIVE
Influenza B, POC: NEGATIVE

## 2023-07-05 LAB — POCT RAPID STREP A (OFFICE): Rapid Strep A Screen: NEGATIVE

## 2023-07-05 MED ORDER — PREDNISONE 20 MG PO TABS: ORAL_TABLET | ORAL | 0 refills | Status: DC

## 2023-07-05 MED ORDER — AZITHROMYCIN 250 MG PO TABS: ORAL_TABLET | ORAL | 0 refills | Status: DC

## 2023-07-05 NOTE — Progress Notes (Unsigned)
Acute Office Visit   Patient: Timothy Finley   DOB: [birth year]   52 y.o. Male  MRN: [id number] Visit Date: 07/05/2023  Today's healthcare provider: Oswaldo Conroy Gizel Riedlinger, PA-C  Introduced myself to the patient as a Secondary school teacher and provided education on APPs in clinical practice.    Chief Complaint  Patient presents with   Generalized Body Aches   Chills   Sore Throat    Right side   Subjective    HPI HPI     Sore Throat    Additional comments: Right side      Last edited by Dollene Primrose, CMA on 07/05/2023  3:05 PM.       URI -type symptoms  Onset: sudden  Duration: started on Wed with subjective fever and chills  Associated symptoms: subjective fever, chills, sore throat, fatigue, myalgias, shivering, postnasal drainage,   Intervention: He has been taking Mucinex, advil, cough medicine  Recent sick contacts: denies known sick contacts  Recent travel: went to the beach for [place] and came back on Dec 1- was with extended family  COVID testing at home: he has not tested for COVID at home   Result: NA    Medications: Outpatient Medications Prior to Visit  Medication Sig   albuterol (PROVENTIL) (2.5 MG/3ML) 0.083% nebulizer solution Take 3 mLs (2.5 mg total) by nebulization every 6 (six) hours as needed for wheezing or shortness of breath.   albuterol (VENTOLIN HFA) 108 (90 Base) MCG/ACT inhaler Inhale 1-2 puffs into the lungs every 6 (six) hours as needed for wheezing or shortness of breath.   busPIRone (BUSPAR) 5 MG tablet TAKE 1-2 TABLETS (5-10 MG TOTAL) BY MOUTH 2 (TWO) TIMES DAILY.   cetirizine (ZYRTEC) 10 MG tablet Take 10 mg by mouth at bedtime.   clonazePAM (KLONOPIN) 0.5 MG tablet Take 1 tablet (0.5 mg total) by mouth 2 (two) times daily as needed for anxiety.   fluticasone (FLONASE) 50 MCG/ACT nasal spray Place 2 sprays into both nostrils daily.   fluticasone-salmeterol (WIXELA INHUB) 250-50 MCG/ACT AEPB INHALE 1 PUFF INTO THE LUNGS TWICE A DAY    rosuvastatin (CRESTOR) 40 MG tablet TAKE 1 TABLET BY MOUTH EVERY DAY   triamcinolone ointment (KENALOG) 0.5 % Apply 1 application topically 2 (two) times daily.   [DISCONTINUED] predniSONE (DELTASONE) 20 MG tablet 3 tabs po daily x 3 days, then 2 tabs x 3 days, then 1.5 tabs x 3 days, then 1 tab x 3 days, then 0.5 tabs x 3 days   No facility-administered medications prior to visit.    Review of Systems  Constitutional:  Positive for chills, fatigue and fever.  HENT:  Positive for congestion, postnasal drip, sinus pressure, sinus pain and sore throat. Negative for ear pain.   Respiratory:  Positive for cough. Negative for shortness of breath and wheezing.   Gastrointestinal:  Negative for diarrhea, nausea and vomiting.  Musculoskeletal:  Positive for myalgias.    {Insert previous labs (optional):23779} {See past labs  Heme  Chem  Endocrine  Serology  Results Review (optional):1}   Objective    BP 132/78   Pulse (!) 105   Temp 98.2 F (36.8 C) (Oral)   Resp 16   Ht 5\' 8"  (1.727 m)   Wt 165 lb (74.8 kg)   SpO2 98%   BMI 25.09 kg/m  {Insert last BP/Wt (optional):23777}{See vitals history (optional):1}   Physical Exam Vitals reviewed.  Constitutional:      General:  He is awake.     Appearance: Normal appearance. He is well-developed and well-groomed.  HENT:     Head: Normocephalic and atraumatic.     Nose: Congestion present.     Mouth/Throat:     Lips: Pink.     Mouth: Mucous membranes are moist.     Pharynx: Uvula midline. Pharyngeal swelling, posterior oropharyngeal erythema and postnasal drip present.     Tonsils: 2+ on the right. 2+ on the left.  Eyes:     General: Lids are normal. Gaze aligned appropriately.  Cardiovascular:     Rate and Rhythm: Normal rate and regular rhythm.     Heart sounds: Normal heart sounds.  Pulmonary:     Effort: Pulmonary effort is normal.     Breath sounds: Normal breath sounds. No decreased air movement. No decreased breath  sounds, wheezing, rhonchi or rales.  Lymphadenopathy:     Head:     Right side of head: Submandibular adenopathy present. No submental or preauricular adenopathy.     Left side of head: Submandibular adenopathy present. No submental or preauricular adenopathy.     Cervical: Cervical adenopathy present.     Right cervical: Superficial cervical adenopathy present.     Left cervical: Superficial cervical adenopathy present.     Upper Body:     Right upper body: No supraclavicular adenopathy.     Left upper body: No supraclavicular adenopathy.  Skin:    General: Skin is warm and dry.  Neurological:     General: No focal deficit present.     Mental Status: He is alert and oriented to person, place, and time.  Psychiatric:        Mood and Affect: Mood normal.        Behavior: Behavior normal. Behavior is cooperative.        Thought Content: Thought content normal.        Judgment: Judgment normal.       Results for orders placed or performed in visit on 07/05/23  POCT rapid strep A  Result Value Ref Range   Rapid Strep A Screen Negative Negative  POCT Influenza A/B  Result Value Ref Range   Influenza A, POC Negative Negative   Influenza B, POC Negative Negative    Assessment & Plan      No follow-ups on file.     Problem List Items Addressed This Visit   None Visit Diagnoses     Flu-like symptoms    -  Primary   Relevant Medications   predniSONE (DELTASONE) 20 MG tablet   Other Relevant Orders   POCT rapid strep A (Completed)   POCT Influenza A/B (Completed)   Novel Coronavirus, NAA ([facility])   Sore throat       Relevant Orders   POCT rapid strep A (Completed)   POCT Influenza A/B (Completed)   Novel Coronavirus, NAA ([facility])   Swelling of tonsil       Relevant Medications   predniSONE (DELTASONE) 20 MG tablet   azithromycin (ZITHROMAX) 250 MG tablet        No follow-ups on file.   I, Averiana Clouatre E Jamesrobert Ohanesian, PA-C, have reviewed all documentation for this visit.  The documentation on 07/05/23 for the exam, diagnosis, procedures, and orders are all accurate and complete.   Jacquelin Hawking, MHS, PA-C [facility]

## 2023-07-05 NOTE — Telephone Encounter (Signed)
  Chief Complaint: fever Symptoms: fever, body aches, sore throat Frequency: started Wednesday Pertinent Negatives: Patient denies SOB, chest pain Disposition: [] ED /[] Urgent Care (no appt availability in office) / [x] Appointment(In office/virtual)/ []  [place]/ [] Home Care/ [] Refused Recommended Disposition /[] [place] [facility]/ []  Follow-up with PCP Additional Notes: No open appointment- scheduled with float provider   Reason for Disposition  [1] Fever returns after gone for over 24 hours AND [2] symptoms worse or not improved  Answer Assessment - Initial Assessment Questions 1. WORST SYMPTOM: "What is your worst symptom?" (e.g., cough, runny nose, muscle aches, headache, sore throat, fever)      Fever at night, sore throat 2. ONSET: "When did your flu symptoms start?"      Wednesday afternoon 3. COUGH: "How bad is the cough?"       Yes- dur to drainage 4. RESPIRATORY DISTRESS: "Describe your breathing."      Not bad- no SOB- has inhaler on hand 5. FEVER: "Do you have a fever?" If Yes, ask: "What is your temperature, how was it measured, and when did it start?"     Not now- at night goes up 6. EXPOSURE: "Were you exposed to someone with influenza?"       no 7. FLU VACCINE: "Did you get a flu shot this year?"     no 8. HIGH RISK DISEASE: "Do you have any chronic medical problems?" (e.g., heart or lung disease, asthma, weak immune system, or other HIGH RISK conditions)     COPD, asthma  10. OTHER SYMPTOMS: "Do you have any other symptoms?"  (e.g., runny nose, muscle aches, headache, sore throat)       Muscle aches,chills  Protocols used: Influenza (Flu) - [facility]

## 2023-07-05 NOTE — Telephone Encounter (Signed)
Summary: Body aches, chillls, temp advice   Pt is calling to report a temp of 101, body aches, chills, congestion since the beginning of the week.  No available appts. Please advise

## 2023-07-05 NOTE — Patient Instructions (Signed)
Based on your described symptoms and the duration of symptoms it is likely that you have a viral upper respiratory infection (often called a "cold")  Symptoms can last for 3-10 days with lingering cough and intermittent symptoms lasting weeks after that.  The goal of treatment at this time is to reduce your symptoms and discomfort   I recommend using Robitussin and Mucinex (regular formulations, nothing with decongestants or DM)  You can also use Tylenol and ibuprofen  for body aches and fever reduction   I also recommend adding an antihistamine to your daily regimen This includes medications like Claritin, Allegra, Zyrtec- the generics of these work very well and are usually less expensive I recommend using Flonase nasal spray - 2 puffs twice per day to help with your nasal congestion The antihistamines and Flonase can take a few weeks to provide significant relief from allergy symptoms but should start to provide some benefit soon. You can use a humidifier at night to help with preventing nasal dryness and irritation    I have sent in a script for Prednisone taper to be taken in the morning with breakfast per the instructions on the container Remember that steroids can cause sleeplessness, irritability, increased hunger and elevated glucose levels so be mindful of these side effects. They should lessen as you progress to the lower doses of the taper.   If your symptoms do not improve or become worse in the next 5-7 days please make an apt at the office so we can see you  Go to the ER if you begin to have more serious symptoms such as shortness of breath, trouble breathing, loss of consciousness, swelling around the eyes, high fever, severe lasting headaches, vision changes or neck pain/stiffness.

## 2023-07-07 ENCOUNTER — Other Ambulatory Visit: Payer: Self-pay | Admitting: Family Medicine

## 2023-07-07 LAB — CULTURE, GROUP A STREP
Micro Number: 15820564
SPECIMEN QUALITY:: ADEQUATE

## 2023-07-08 DIAGNOSIS — R6889 Other general symptoms and signs: Secondary | ICD-10-CM | POA: Diagnosis not present

## 2023-07-08 DIAGNOSIS — J029 Acute pharyngitis, unspecified: Secondary | ICD-10-CM | POA: Diagnosis not present

## 2023-07-08 NOTE — Progress Notes (Signed)
Your strep culture was negative

## 2023-07-09 NOTE — Telephone Encounter (Signed)
Requested Prescriptions  Pending Prescriptions Disp Refills   WIXELA INHUB 250-50 MCG/ACT AEPB [Pharmacy Med Name: WIXELA 250-50 INHUB] 60 each 12    Sig: INHALE 1 PUFF INTO THE LUNGS TWICE A DAY     Pulmonology:  Combination Products Passed - 07/07/2023  8:44 AM      Passed - Valid encounter within last 12 months    Recent Outpatient Visits           4 days ago Flu-like symptoms   [place] [facility] Mecum, Erin E, PA-C   2 months ago Allergic contact dermatitis due to urushiol from [place] poison ivy   [place] [facility] Mecum, Oswaldo Conroy, PA-C   4 months ago Upper back pain   [place] [facility] Mecum, Oswaldo Conroy, PA-C   10 months ago Panic disorder   [place] [facility] [place], Megan P, DO   1 year ago Routine general medical examination at a health care facility   [facility] Dorcas Carrow, DO       Future Appointments             In 2 days Dorcas Carrow, DO  [facility]

## 2023-07-10 LAB — SPECIMEN STATUS REPORT

## 2023-07-10 LAB — NOVEL CORONAVIRUS, NAA: SARS-CoV-2, NAA: NOT DETECTED

## 2023-07-10 NOTE — Progress Notes (Signed)
COVID negative.

## 2023-07-11 ENCOUNTER — Ambulatory Visit (INDEPENDENT_AMBULATORY_CARE_PROVIDER_SITE_OTHER): Payer: 59 | Admitting: Family Medicine

## 2023-07-11 VITALS — BP 121/79 | HR 116 | Ht 68.0 in | Wt 166.4 lb

## 2023-07-11 DIAGNOSIS — R002 Palpitations: Secondary | ICD-10-CM | POA: Diagnosis not present

## 2023-07-11 DIAGNOSIS — J4521 Mild intermittent asthma with (acute) exacerbation: Secondary | ICD-10-CM | POA: Diagnosis not present

## 2023-07-11 DIAGNOSIS — E782 Mixed hyperlipidemia: Secondary | ICD-10-CM

## 2023-07-11 DIAGNOSIS — F1010 Alcohol abuse, uncomplicated: Secondary | ICD-10-CM | POA: Diagnosis not present

## 2023-07-11 DIAGNOSIS — Z0000 Encounter for general adult medical examination without abnormal findings: Secondary | ICD-10-CM | POA: Diagnosis not present

## 2023-07-11 DIAGNOSIS — F410 Panic disorder [episodic paroxysmal anxiety] without agoraphobia: Secondary | ICD-10-CM

## 2023-07-11 MED ORDER — ALBUTEROL SULFATE HFA 108 (90 BASE) MCG/ACT IN AERS
1.0000 | INHALATION_SPRAY | Freq: Four times a day (QID) | RESPIRATORY_TRACT | 3 refills | Status: AC | PRN
Start: 2023-07-11 — End: ?

## 2023-07-11 MED ORDER — ROSUVASTATIN CALCIUM 40 MG PO TABS: 40.0000 mg | ORAL_TABLET | Freq: Every day | ORAL | 1 refills | Status: DC

## 2023-07-11 MED ORDER — BUSPIRONE HCL 5 MG PO TABS: 5.0000 mg | ORAL_TABLET | Freq: Two times a day (BID) | ORAL | 1 refills | Status: DC

## 2023-07-11 MED ORDER — ALBUTEROL SULFATE (2.5 MG/3ML) 0.083% IN NEBU: 2.5000 mg | INHALATION_SOLUTION | Freq: Four times a day (QID) | RESPIRATORY_TRACT | 1 refills | Status: AC | PRN

## 2023-07-11 MED ORDER — FLUTICASONE-SALMETEROL 250-50 MCG/ACT IN AEPB: 1.0000 | INHALATION_SPRAY | Freq: Two times a day (BID) | RESPIRATORY_TRACT | 12 refills | Status: AC

## 2023-07-11 MED ORDER — NALTREXONE HCL 50 MG PO TABS: 25.0000 mg | ORAL_TABLET | Freq: Every day | ORAL | 2 refills | Status: DC

## 2023-07-11 MED ORDER — CLONAZEPAM 0.5 MG PO TABS: 0.5000 mg | ORAL_TABLET | Freq: Two times a day (BID) | ORAL | 1 refills | Status: DC | PRN

## 2023-07-11 NOTE — Assessment & Plan Note (Signed)
Under good control on current regimen. Continue current regimen. Continue to monitor. Call with any concerns. Refills given. Follow up 6 weeks.

## 2023-07-11 NOTE — Assessment & Plan Note (Signed)
Came off alcohol for several months and felt great. Will start naltrexone and recheck in about 6 weeks. Call with any concerns.

## 2023-07-11 NOTE — Assessment & Plan Note (Signed)
Under good control on current regimen. Continue current regimen. Continue to monitor. Call with any concerns. Refills given.   

## 2023-07-11 NOTE — Progress Notes (Signed)
BP 121/79   Pulse (!) 116   Ht 5\' 8"  (1.727 m)   Wt 166 lb 6.4 oz (75.5 kg)   SpO2 97%   BMI 25.30 kg/m    Subjective:    Patient ID: Timothy Finley, male    DOB: 1971/03/09, 52 y.o.   MRN: [id number]  HPI: Timothy Finley is a 52 y.o. male presenting on 07/11/2023 for comprehensive medical examination. Current medical complaints include:  HYPERLIPIDEMIA Hyperlipidemia status: excellent compliance Satisfied with current treatment?  yes Side effects:  no Medication compliance: excellent compliance Past cholesterol meds: crestor Supplements: none Aspirin:  no The 10-year ASCVD risk score (Arnett DK, et al., 2019) is: 4.6%   Values used to calculate the score:     Age: 24 years     Sex: Male     Is Non-Hispanic African American: No     Diabetic: No     Tobacco smoker: Yes     Systolic Blood Pressure: 121 mmHg     Is BP treated: No     HDL Cholesterol: 73 mg/dL     Total Cholesterol: 185 mg/dL Chest pain:  no Coronary artery disease:  no  ASTHMA Asthma status: controlled Satisfied with current treatment?: yes Albuterol/rescue inhaler frequency: occasionally Dyspnea frequency: occasionally Wheezing frequency: rarely Cough frequency: rarely Nocturnal symptom frequency: never  Limitation of activity: no Current upper respiratory symptoms: no Pneumovax: Up to Date Influenza: Not up to Date  PALPITATIONS- with alcohol use Duration: few months Symptom description: just feels weird Duration of episode: 10-20 minutes Frequency: at random, 2 days in a row and then not a month- worse the day after he drinks Activity when event occurred: drinking alcohol Related to exertion: no Dyspnea: no Chest pain: yes Syncope: no Anxiety/stress: yes Nausea/vomiting: no Diaphoresis: no Coronary artery disease: no Congestive heart failure: no Arrhythmia:no Thyroid disease: no Caffeine intake: 1 cafienated beverage Status:  fluctuating Treatments  attempted:none  ANXIETY/DEPRESSION Duration: chronic Status:stable Anxious mood: yes  Excessive worrying: yes Irritability: no  Sweating: no Nausea: no Palpitations:yes Hyperventilation: no Panic attacks: no Agoraphobia: no  Obscessions/compulsions: no Depressed mood: no    07/11/2023    4:01 PM 07/05/2023    3:05 PM 04/24/2023    9:42 AM 02/19/2023    2:44 PM 09/07/2022    3:07 PM  Depression screen PHQ 2/9  Decreased Interest 1 0 1 1 1   Down, Depressed, Hopeless 0 0 1 1 1   PHQ - 2 Score 1 0 2 2 2   Altered sleeping 1 0 1 2 1   Tired, decreased energy 1 0 1 1 0  Change in appetite 0 0 0 0 0  Feeling bad or failure about yourself  0 0 0 1 0  Trouble concentrating 0 0 1 0 0  Moving slowly or fidgety/restless 0 0 0 0 0  Suicidal thoughts 0 0 0 0 0  PHQ-9 Score 3 0 5 6 3   Difficult doing work/chores Somewhat difficult  Somewhat difficult Somewhat difficult Somewhat difficult   Anhedonia: no Weight changes: no Insomnia: no   Hypersomnia: no Fatigue/loss of energy: no Feelings of worthlessness: no Feelings of guilt: no Impaired concentration/indecisiveness: no Suicidal ideations: no  Crying spells: no Recent Stressors/Life Changes: yes   Relationship problems: no   Family stress: no     Financial stress: no    Job stress: yes    Recent death/loss: no  Interim Problems from his last visit: no  Depression Screen done today and results  listed below:     07/11/2023    4:01 PM 07/05/2023    3:05 PM 04/24/2023    9:42 AM 02/19/2023    2:44 PM 09/07/2022    3:07 PM  Depression screen PHQ 2/9  Decreased Interest 1 0 1 1 1   Down, Depressed, Hopeless 0 0 1 1 1   PHQ - 2 Score 1 0 2 2 2   Altered sleeping 1 0 1 2 1   Tired, decreased energy 1 0 1 1 0  Change in appetite 0 0 0 0 0  Feeling bad or failure about yourself  0 0 0 1 0  Trouble concentrating 0 0 1 0 0  Moving slowly or fidgety/restless 0 0 0 0 0  Suicidal thoughts 0 0 0 0 0  PHQ-9 Score 3 0 5 6 3   Difficult  doing work/chores Somewhat difficult  Somewhat difficult Somewhat difficult Somewhat difficult    Past Medical History:  Past Medical History:  Diagnosis Date   Allergic bronchitis    Anxiety    Kidney stones     Surgical History:  Past Surgical History:  Procedure Laterality Date   INSERTION OF MESH  05/23/2022   Procedure: INSERTION OF MESH;  Surgeon: Campbell Lerner, MD;  Location: [facility] ORS;  Service: General;;   WISDOM TOOTH EXTRACTION      Medications:  Current Outpatient Medications on File Prior to Visit  Medication Sig   cetirizine (ZYRTEC) 10 MG tablet Take 10 mg by mouth at bedtime.   fluticasone (FLONASE) 50 MCG/ACT nasal spray Place 2 sprays into both nostrils daily.   No current facility-administered medications on file prior to visit.    Allergies:  Allergies  Allergen Reactions   Morphine Other (See Comments)    Morphine oral medication. Patient was unable to move his body and unable to speak that lasted about 30 minutes. Per patient , it is "sleep paralysis"     Social History:  Social History   Socioeconomic History   Marital status: Married    Spouse name: Not on file   Number of children: Not on file   Years of education: Not on file   Highest education level: Some college, no degree  Occupational History   Not on file  Tobacco Use   Smoking status: Every Day    Current packs/day: 1.00    Types: Cigarettes   Smokeless tobacco: Former  Building services engineer status: Never Used  Substance and Sexual Activity   Alcohol use: Yes    Alcohol/week: 49.0 standard drinks of alcohol    Types: 42 Cans of beer, 7 Shots of liquor per week    Comment: 4-6 beers daily   Drug use: Yes    Types: Marijuana    Comment: maybe once or twice a month    Sexual activity: Yes    Birth control/protection: None  Other Topics Concern   Not on file  Social History Narrative   Not on file   Social Drivers of Health   Financial Resource Strain: Low Risk   (07/11/2023)   Overall Financial Resource Strain (CARDIA)    Difficulty of Paying Living Expenses: Not hard at all  Food Insecurity: No Food Insecurity (07/11/2023)   Hunger Vital Sign    Worried About Running Out of Food in the Last Year: Never true    Ran Out of Food in the Last Year: Never true  Transportation Needs: No Transportation Needs (07/11/2023)   PRAPARE - Transportation  Lack of Transportation (Medical): No    Lack of Transportation (Non-Medical): No  Physical Activity: Insufficiently Active (07/11/2023)   Exercise Vital Sign    Days of Exercise per Week: 1 day    Minutes of Exercise per Session: 60 min  Stress: No Stress Concern Present (07/11/2023)   [employer] of Occupational Health - Occupational Stress Questionnaire    Feeling of Stress : Only a little  Social Connections: Moderately Isolated (07/11/2023)   Social Connection and Isolation Panel [NHANES]    Frequency of Communication with Friends and Family: Twice a week    Frequency of Social Gatherings with Friends and Family: Once a week    Attends Religious Services: Never    Database administrator or Organizations: No    Attends Engineer, structural: Not on file    Marital Status: Married  Catering manager Violence: Not on file   Social History   Tobacco Use  Smoking Status Every Day   Current packs/day: 1.00   Types: Cigarettes  Smokeless Tobacco Former   Social History   Substance and Sexual Activity  Alcohol Use Yes   Alcohol/week: 49.0 standard drinks of alcohol   Types: 42 Cans of beer, 7 Shots of liquor per week   Comment: 4-6 beers daily    Family History:  Family History  Problem Relation Age of Onset   Cancer Maternal Grandmother        Breast   Cancer Maternal Grandfather    Aneurysm Paternal Grandfather        Brain    Past medical history, surgical history, medications, allergies, family history and social history reviewed with patient today and changes made to  appropriate areas of the chart.   Review of Systems  Constitutional: Negative.   HENT: Negative.    Eyes: Negative.   Respiratory: Negative.    Cardiovascular:  Positive for chest pain and palpitations. Negative for orthopnea, claudication, leg swelling and PND.  Gastrointestinal:  Positive for diarrhea and heartburn. Negative for abdominal pain, blood in stool, constipation, melena, nausea and vomiting.  Genitourinary: Negative.   Musculoskeletal: Negative.   Skin: Negative.   Neurological: Negative.   Endo/Heme/Allergies:  Negative for environmental allergies and polydipsia. Bruises/bleeds easily.  Psychiatric/Behavioral:  Negative for depression, hallucinations, memory loss, substance abuse and suicidal ideas. The patient is nervous/anxious. The patient does not have insomnia.   zio All other ROS negative except what is listed above and in the HPI.      Objective:    BP 121/79   Pulse (!) 116   Ht 5\' 8"  (1.727 m)   Wt 166 lb 6.4 oz (75.5 kg)   SpO2 97%   BMI 25.30 kg/m   Wt Readings from Last 3 Encounters:  07/11/23 166 lb 6.4 oz (75.5 kg)  07/05/23 165 lb (74.8 kg)  02/19/23 159 lb 9.6 oz (72.4 kg)    Physical Exam Vitals and nursing note reviewed.  Constitutional:      General: He is not in acute distress.    Appearance: Normal appearance. He is normal weight. He is not ill-appearing, toxic-appearing or diaphoretic.  HENT:     Head: Normocephalic and atraumatic.     Right Ear: Tympanic membrane, ear canal and external ear normal. There is no impacted cerumen.     Left Ear: Tympanic membrane, ear canal and external ear normal. There is no impacted cerumen.     Nose: Nose normal. No congestion or rhinorrhea.     Mouth/Throat:  Mouth: Mucous membranes are moist.     Pharynx: Oropharynx is clear. Posterior oropharyngeal erythema present. No oropharyngeal exudate.  Eyes:     General: No scleral icterus.       Right eye: No discharge.        Left eye: No discharge.      Extraocular Movements: Extraocular movements intact.     Conjunctiva/sclera: Conjunctivae normal.     Pupils: Pupils are equal, round, and reactive to light.  Neck:     Vascular: No carotid bruit.  Cardiovascular:     Rate and Rhythm: Normal rate and regular rhythm.     Pulses: Normal pulses.     Heart sounds: Normal heart sounds. No murmur heard.    No friction rub. No gallop.  Pulmonary:     Effort: Pulmonary effort is normal. No respiratory distress.     Breath sounds: Normal breath sounds. No stridor. No wheezing, rhonchi or rales.  Chest:     Chest wall: No tenderness.  Abdominal:     General: Abdomen is flat. Bowel sounds are normal. There is no distension.     Palpations: Abdomen is soft. There is no mass.     Tenderness: There is no abdominal tenderness. There is no right CVA tenderness, left CVA tenderness, guarding or rebound.     Hernia: No hernia is present.  Genitourinary:    Comments: Genital exam deferred with shared decision making Musculoskeletal:        General: No swelling, tenderness, deformity or signs of injury. Normal range of motion.     Cervical back: Normal range of motion and neck supple. No rigidity. No muscular tenderness.     Right lower leg: No edema.     Left lower leg: No edema.  Lymphadenopathy:     Cervical: No cervical adenopathy.  Skin:    General: Skin is warm and dry.     Capillary Refill: Capillary refill takes less than 2 seconds.     Coloration: Skin is not jaundiced or pale.     Findings: No bruising, erythema, lesion or rash.  Neurological:     General: No focal deficit present.     Mental Status: He is alert and oriented to person, place, and time. Mental status is at baseline.     Cranial Nerves: No cranial nerve deficit.     Sensory: No sensory deficit.     Motor: No weakness.     Coordination: Coordination normal.     Gait: Gait normal.     Deep Tendon Reflexes: Reflexes normal.  Psychiatric:        Mood and Affect: Mood  normal.        Behavior: Behavior normal.        Thought Content: Thought content normal.        Judgment: Judgment normal.     Results for orders placed or performed in visit on 07/05/23  POCT rapid strep A   Collection Time: 07/05/23  3:08 PM  Result Value Ref Range   Rapid Strep A Screen Negative Negative  POCT Influenza A/B   Collection Time: 07/05/23  3:09 PM  Result Value Ref Range   Influenza A, POC Negative Negative   Influenza B, POC Negative Negative  Culture, Group A Strep   Collection Time: 07/05/23  3:36 PM   Specimen: Throat  Result Value Ref Range   Micro Number [id number]    SPECIMEN QUALITY: Adequate    SOURCE: THROAT    STATUS: FINAL  RESULT: No group A Streptococcus isolated   Novel Coronavirus, NAA ([facility])   Collection Time: 07/08/23 12:00 AM   Specimen: Nasopharyngeal(NP) swabs in vial transport medium   Nasopharynge  Previous  Result Value Ref Range   SARS-CoV-2, NAA Not Detected Not Detected  Specimen status report   Collection Time: 07/08/23 12:00 AM  Result Value Ref Range   specimen status report Comment       Assessment & Plan:   Problem List Items Addressed This Visit       Respiratory   Asthma   Under good control on current regimen. Continue current regimen. Continue to monitor. Call with any concerns. Refills given.        Relevant Medications   albuterol (PROVENTIL) (2.5 MG/3ML) 0.083% nebulizer solution   albuterol (VENTOLIN HFA) 108 (90 Base) MCG/ACT inhaler   fluticasone-salmeterol (WIXELA INHUB) 250-50 MCG/ACT AEPB     Other   Panic disorder   Under good control on current regimen. Continue current regimen. Continue to monitor. Call with any concerns. Refills given. Follow up 6 weeks.         Relevant Medications   busPIRone (BUSPAR) 5 MG tablet   clonazePAM (KLONOPIN) 0.5 MG tablet   Hyperlipidemia   Under good control on current regimen. Continue current regimen. Continue to monitor. Call with any concerns.  Refills given. Labs drawn today.        Relevant Medications   rosuvastatin (CRESTOR) 40 MG tablet   Alcohol abuse   Came off alcohol for several months and felt great. Will start naltrexone and recheck in about 6 weeks. Call with any concerns.       Other Visit Diagnoses       Routine general medical examination at a health care facility    -  Primary   Vaccines up to date. Screening labs checked today. Cologuard at home- will do. Call with any concerns. Continue to monitor.   Relevant Orders   Comprehensive metabolic panel   CBC with Differential/Platelet   Lipid Panel w/o Chol/HDL Ratio   PSA   TSH     Palpitations       Likely alcohol related- EKG normal. Will check zio. Await results. Check back in in 6 weeks.   Relevant Orders   EKG 12-Lead (Completed)   LONG TERM MONITOR (3-14 DAYS)        Discussed aspirin prophylaxis for myocardial infarction prevention and decision was it was not indicated  LABORATORY TESTING:  Health maintenance labs ordered today as discussed above.   The natural history of prostate cancer and ongoing controversy regarding screening and potential treatment outcomes of prostate cancer has been discussed with the patient. The meaning of a false positive PSA and a false negative PSA has been discussed. He indicates understanding of the limitations of this screening test and wishes to proceed with screening PSA testing.   IMMUNIZATIONS:   - Tdap: Tetanus vaccination status reviewed: last tetanus booster within 10 years. - Influenza: Refused - Pneumovax: Up to date - Prevnar: Not applicable - COVID: Up to date - HPV: Not applicable - Shingrix vaccine: Up to date  SCREENING: - Colonoscopy: Ordered today  Discussed with patient purpose of the colonoscopy is to detect colon cancer at curable precancerous or early stages   PATIENT COUNSELING:    Sexuality: Discussed sexually transmitted diseases, partner selection, use of condoms, avoidance of  unintended pregnancy  and contraceptive alternatives.   Advised to avoid cigarette smoking.  I discussed with the  patient that most people either abstain from alcohol or drink within safe limits (<=14/week and <=4 drinks/occasion for males, <=7/weeks and <= 3 drinks/occasion for females) and that the risk for alcohol disorders and other health effects rises proportionally with the number of drinks per week and how often a drinker exceeds daily limits.  Discussed cessation/primary prevention of drug use and availability of treatment for abuse.   Diet: Encouraged to adjust caloric intake to maintain  or achieve ideal body weight, to reduce intake of dietary saturated fat and total fat, to limit sodium intake by avoiding high sodium foods and not adding table salt, and to maintain adequate dietary potassium and calcium preferably from fresh fruits, vegetables, and low-fat dairy products.    stressed the importance of regular exercise  Injury prevention: Discussed safety belts, safety helmets, smoke detector, smoking near bedding or upholstery.   Dental health: Discussed importance of regular tooth brushing, flossing, and dental visits.   Follow up plan: NEXT PREVENTATIVE PHYSICAL DUE IN 1 YEAR. Return in about 6 weeks (around 08/22/2023).

## 2023-07-11 NOTE — Assessment & Plan Note (Signed)
Under good control on current regimen. Continue current regimen. Continue to monitor. Call with any concerns. Refills given. Labs drawn today.   

## 2023-07-12 ENCOUNTER — Ambulatory Visit: Payer: 59 | Attending: Family Medicine

## 2023-07-12 DIAGNOSIS — R002 Palpitations: Secondary | ICD-10-CM

## 2023-07-12 LAB — COMPREHENSIVE METABOLIC PANEL
ALT: 31 [IU]/L (ref 0–44)
AST: 15 [IU]/L (ref 0–40)
Albumin: 4.4 g/dL (ref 3.8–4.9)
Alkaline Phosphatase: 99 [IU]/L (ref 44–121)
BUN/Creatinine Ratio: 11 (ref 9–20)
BUN: 10 mg/dL (ref 6–24)
Bilirubin Total: 0.3 mg/dL (ref 0.0–1.2)
CO2: 23 mmol/L (ref 20–29)
Calcium: 9.7 mg/dL (ref 8.7–10.2)
Chloride: 100 mmol/L (ref 96–106)
Creatinine, Ser: 0.92 mg/dL (ref 0.76–1.27)
Globulin, Total: 2.5 g/dL (ref 1.5–4.5)
Glucose: 115 mg/dL — ABNORMAL HIGH (ref 70–99)
Potassium: 4 mmol/L (ref 3.5–5.2)
Sodium: 140 mmol/L (ref 134–144)
Total Protein: 6.9 g/dL (ref 6.0–8.5)
eGFR: 101 mL/min/{1.73_m2} (ref >59)

## 2023-07-12 LAB — CBC WITH DIFFERENTIAL/PLATELET
Basophils Absolute: 0 10*3/uL (ref 0.0–0.2)
Basos: 0 %
EOS (ABSOLUTE): 0.1 10*3/uL (ref 0.0–0.4)
Eos: 1 %
Hematocrit: 46.4 % (ref 37.5–51.0)
Hemoglobin: 15.6 g/dL (ref 13.0–17.7)
Lymphocytes Absolute: 2.9 10*3/uL (ref 0.7–3.1)
Lymphs: 20 %
MCH: 31.8 pg (ref 26.6–33.0)
MCHC: 33.6 g/dL (ref 31.5–35.7)
MCV: 95 fL (ref 79–97)
Monocytes Absolute: 1.7 10*3/uL — ABNORMAL HIGH (ref 0.1–0.9)
Monocytes: 12 %
Neutrophils Absolute: 8.9 10*3/uL — ABNORMAL HIGH (ref 1.4–7.0)
Neutrophils: 62 %
Platelets: 359 10*3/uL (ref 150–450)
RBC: 4.9 x10E6/uL (ref 4.14–5.80)
RDW: 12.1 % (ref 11.6–15.4)
WBC: 14.4 10*3/uL — ABNORMAL HIGH (ref 3.4–10.8)

## 2023-07-12 LAB — LIPID PANEL W/O CHOL/HDL RATIO
Cholesterol, Total: 121 mg/dL (ref 100–199)
HDL: 42 mg/dL (ref >39)
LDL Chol Calc (NIH): 38 mg/dL (ref 0–99)
Triglycerides: 272 mg/dL — ABNORMAL HIGH (ref 0–149)
VLDL Cholesterol Cal: 41 mg/dL — ABNORMAL HIGH (ref 5–40)

## 2023-07-12 LAB — IMMATURE CELLS
MYELOCYTES: 3 % — ABNORMAL HIGH (ref 0–0)
Metamyelocytes: 2 % — ABNORMAL HIGH (ref 0–0)

## 2023-07-12 LAB — TSH: TSH: 0.712 u[IU]/mL (ref 0.450–4.500)

## 2023-07-12 LAB — PSA: Prostate Specific Ag, Serum: 1.2 ng/mL (ref 0.0–4.0)

## 2023-07-15 NOTE — Progress Notes (Signed)
Interpreted by me 07/11/23. NSR at 95bpm. No ST segment changes.

## 2023-07-19 DIAGNOSIS — M9903 Segmental and somatic dysfunction of lumbar region: Secondary | ICD-10-CM | POA: Diagnosis not present

## 2023-07-19 DIAGNOSIS — M9905 Segmental and somatic dysfunction of pelvic region: Secondary | ICD-10-CM | POA: Diagnosis not present

## 2023-07-19 DIAGNOSIS — M41117 Juvenile idiopathic scoliosis, lumbosacral region: Secondary | ICD-10-CM | POA: Diagnosis not present

## 2023-07-19 DIAGNOSIS — M5432 Sciatica, left side: Secondary | ICD-10-CM | POA: Diagnosis not present

## 2023-07-23 DIAGNOSIS — R002 Palpitations: Secondary | ICD-10-CM

## 2023-08-02 ENCOUNTER — Ambulatory Visit: Payer: 59 | Admitting: Family Medicine

## 2023-08-09 DIAGNOSIS — R002 Palpitations: Secondary | ICD-10-CM | POA: Diagnosis not present

## 2023-08-14 ENCOUNTER — Ambulatory Visit (INDEPENDENT_AMBULATORY_CARE_PROVIDER_SITE_OTHER): Payer: 59 | Admitting: Pediatrics

## 2023-08-14 ENCOUNTER — Encounter: Payer: Self-pay | Admitting: Pediatrics

## 2023-08-14 VITALS — BP 134/76 | HR 105 | Temp 99.0°F | Resp 16 | Wt 165.7 lb

## 2023-08-14 DIAGNOSIS — J019 Acute sinusitis, unspecified: Secondary | ICD-10-CM | POA: Diagnosis not present

## 2023-08-14 DIAGNOSIS — R6889 Other general symptoms and signs: Secondary | ICD-10-CM

## 2023-08-14 DIAGNOSIS — Z1330 Encounter for screening examination for mental health and behavioral disorders, unspecified: Secondary | ICD-10-CM | POA: Diagnosis not present

## 2023-08-14 DIAGNOSIS — Z1152 Encounter for screening for COVID-19: Secondary | ICD-10-CM | POA: Diagnosis not present

## 2023-08-14 DIAGNOSIS — J029 Acute pharyngitis, unspecified: Secondary | ICD-10-CM | POA: Diagnosis not present

## 2023-08-14 MED ORDER — AMOXICILLIN 500 MG PO CAPS: 500.0000 mg | ORAL_CAPSULE | Freq: Three times a day (TID) | ORAL | 0 refills | Status: AC

## 2023-08-14 NOTE — Progress Notes (Signed)
 Office Visit  BP 134/76 (BP Location: Left Arm, Patient Position: Sitting, Cuff Size: Normal)   Pulse (!) 105   Temp 99 F (37.2 C) (Oral)   Resp 16   Wt 165 lb 11.2 oz (75.2 kg)   SpO2 98%   BMI 25.19 kg/m    Subjective:    Patient ID: Timothy Finley, male    DOB: [birth year], 53 y.o.   MRN: [id number]  HPI: Timothy Finley is a 53 y.o. male  Chief Complaint  Patient presents with   URI    Felt sick and throat scratchy started sat. Congestion, sore throat, coughing, sneezing some, discharge, pressure. Joint pain some.     Discussed the use of AI scribe software for clinical note transcription with the patient, who gave verbal consent to proceed.  History of Present Illness   The patient, a smoker with a pack a day habit, presents with symptoms of upper respiratory infection that began on Saturday. He reports significant nasal and chest congestion, which he describes as the Finley severe symptom. Upon waking, he experiences notable shortness of breath, which improves slightly after expectorating some of the congestion. He denies any recent exposure to sick contacts.  To manage his symptoms, he has been using Mucinex, cough syrup, and a neti pot. He also uses albuterol  at home, but the frequency of use has not increased with this illness. The patient reports that the mucus he is expelling is neon yellow in color. He denies any vision changes, ear pain, or significant tenderness in the sinus area, but does note a feeling of pressure.  The patient has a history of recurrent sinus infections, which he reports do not typically respond to azithromycin . He has used a Programmer, systems inhaler in the past, but denies any formal diagnosis of COPD.      Relevant past medical, surgical, family and social history reviewed and updated as indicated. Interim medical history since our last visit reviewed. Allergies and medications reviewed and updated.  ROS per HPI unless specifically indicated above      Objective:    BP 134/76 (BP Location: Left Arm, Patient Position: Sitting, Cuff Size: Normal)   Pulse (!) 105   Temp 99 F (37.2 C) (Oral)   Resp 16   Wt 165 lb 11.2 oz (75.2 kg)   SpO2 98%   BMI 25.19 kg/m   Wt Readings from Last 3 Encounters:  08/14/23 165 lb 11.2 oz (75.2 kg)  07/11/23 166 lb 6.4 oz (75.5 kg)  07/05/23 165 lb (74.8 kg)     Physical Exam Constitutional:      Appearance: Normal appearance.  HENT:     Head: Normocephalic and atraumatic.     Jaw: Tenderness present.     Right Ear: Tympanic membrane and ear canal normal.     Left Ear: Tympanic membrane and ear canal normal.     Nose: Congestion present.     Right Turbinates: Enlarged.     Left Turbinates: Enlarged.     Right Sinus: No maxillary sinus tenderness or frontal sinus tenderness.     Left Sinus: No maxillary sinus tenderness or frontal sinus tenderness.     Mouth/Throat:     Pharynx: Posterior oropharyngeal erythema present.  Eyes:     Conjunctiva/sclera: Conjunctivae normal.     Pupils: Pupils are equal, round, and reactive to light.  Cardiovascular:     Rate and Rhythm: Normal rate and regular rhythm.     Pulses: Normal pulses.  Heart sounds: Normal heart sounds.  Pulmonary:     Effort: Pulmonary effort is normal.     Breath sounds: Normal breath sounds.  Musculoskeletal:        General: Normal range of motion.     Cervical back: Normal range of motion.  Skin:    General: Skin is warm and dry.     Capillary Refill: Capillary refill takes less than 2 seconds.  Neurological:     General: No focal deficit present.     Mental Status: He is alert. Mental status is at baseline.  Psychiatric:        Mood and Affect: Mood normal.        Behavior: Behavior normal.         08/14/2023    1:34 PM 07/11/2023    4:01 PM 07/05/2023    3:05 PM 04/24/2023    9:42 AM 02/19/2023    2:44 PM  Depression screen PHQ 2/9  Decreased Interest 1 1 0 1 1  Down, Depressed, Hopeless 1 0 0 1 1  PHQ - 2  Score 2 1 0 2 2  Altered sleeping 1 1 0 1 2  Tired, decreased energy 0 1 0 1 1  Change in appetite 0 0 0 0 0  Feeling bad or failure about yourself  0 0 0 0 1  Trouble concentrating 0 0 0 1 0  Moving slowly or fidgety/restless 0 0 0 0 0  Suicidal thoughts 0 0 0 0 0  PHQ-9 Score 3 3 0 5 6  Difficult doing work/chores Somewhat difficult Somewhat difficult  Somewhat difficult Somewhat difficult       08/14/2023    1:34 PM 07/11/2023    4:01 PM 04/24/2023    9:42 AM 02/19/2023    2:45 PM  GAD 7 : Generalized Anxiety Score  Nervous, Anxious, on Edge 1 1 2 1   Control/stop worrying 0 0 2 1  Worry too much - different things 1 1 2 1   Trouble relaxing 1 0 2 0  Restless 1 0 2 0  Easily annoyed or irritable 1 1 2  0  Afraid - awful might happen 1 0 2 0  Total GAD 7 Score 6 3 14 3   Anxiety Difficulty Not difficult at all Somewhat difficult Somewhat difficult Somewhat difficult       Assessment & Plan:  Assessment & Plan   Acute non-recurrent sinusitis, unspecified location Sore throat Flu-like symptoms Symptoms of congestion, cough, and shortness of breath started on Saturday. No improvement with over-the-counter remedies. Noted sinus inflammation on examination. Patient has a history of sinus infections and reports that Z-Pak has not been effective in the past. Turbinates erythematous and swollen with some jaw tenderness. Has had yellow mucous production/fould smelling, suspect viral infection complicated by bacterial sinusitis. Negative rapid flu and strep testing today. COVID pending. -Prescribe Amoxicillin  for sinusitis. -Advise patient to contact the office if symptoms do not improve within a week, as a stronger antibiotic may be needed. -     Amoxicillin ; Take 1 capsule (500 mg total) by mouth 3 (three) times daily for 10 days.  Dispense: 30 capsule; Refill: 0 -     Rapid Strep Screen ([facility] ONLY) -     Novel Coronavirus, NAA ([facility]) -     Veritor Flu A/B  Waived  Encounter for behavioral health screening As part of their intake evaluation, the patient was screened for depression, anxiety.  PHQ9 SCORE 3, GAD7 SCORE 6. Screening  results negative for tested conditions. Continue to monitor.  Follow up plan: Return if symptoms worsen or fail to improve.  Timothy Bearman Jilda Most, MD   Approximately 30 minutes spent on patient encounter today including assessment, counseling, diagnosing, treatment plan development, and charting.

## 2023-08-14 NOTE — Patient Instructions (Addendum)
 Amoxicillin  500 three times daily for 10 days for sinus infection  Most cold symptoms last up to 2 weeks, but cough can sometimes linger up to 4 weeks.  However if your symtpoms get WORSE - like you develop fevers or get more shortness of breath, then call your clinic as you may need to be evaluated.   Aches and Pains Acetaminophen  (Tylenol ): 1000mg  ("extra strength" tablets are 500mg , so take 2) every 8 hours if needed  Ibuprofen  (Advil /Motrin ) 400-800mg  (comes in 200mg  pills OTC, so 2-4 pills) every 8 hours.   Sore Throat:  See Aches and Pains meds above, also Sore throat sprays and lozenges may also help.   Cough:  Honey 2 TBS every 4-6 hours if needed.  Robitussin DM syrup or generic equivalent which has (guaifenesin = an expectorant to help you get stuff up + dextromethorphan (DM) = cough supressant). You can also get this in tablet formula (like Mucinex DM or generic equivalent).  If you have asthma or are wheezing and have a tight chest, then albuterol  inhaler (Ventolin , ProAir ) may be helpful - you need a prescription for this.   Congestion:  oxymetazoline (Afrin) nasal stray: 2 sprays each nostril every 12 hours. Don't use more than 3 days in a row to avoid building a tolerance to it.  Sinus rinse (neti pot) high volume sinus rinse can help open up your sinuses and be helpful, especially if you're having sinus pressure and headaches.   Other:  Umcka (pelargonium sidoides extract) can to shorten cold symptoms (can be hard to find, but [employer] carries it: brand name Umcka ColdCare from [employer]). Works best if you start taking at earliest signs of cold symptoms.  Andrographis paniculata is another herbal remedy with less evidence, but may reduce common cold symptoms in adults.  zinc acetate lozenges >= 80 mg/day reduces duration but not severity of cold symptoms in adults, but it is associated with bad taste and nausea Heated humidified air may reduce cold symptoms, so try  using a humidifier - especially in your bedroom at night.  Stay hydrated! Aim to drink at least 2 liters of water daily.   What doesn't work (but lots of folks think might) Vitamin C: bummer right?! But there's no evidence that high dose vitamin C will help cold symptoms.

## 2023-08-15 LAB — NOVEL CORONAVIRUS, NAA: SARS-CoV-2, NAA: NOT DETECTED

## 2023-08-17 LAB — VERITOR FLU A/B WAIVED
Influenza A: NEGATIVE
Influenza B: NEGATIVE

## 2023-08-17 LAB — RAPID STREP SCREEN (MED CTR MEBANE ONLY): Strep Gp A Ag, IA W/Reflex: NEGATIVE

## 2023-08-17 LAB — CULTURE, GROUP A STREP: Strep A Culture: NEGATIVE

## 2023-08-22 ENCOUNTER — Encounter: Payer: Self-pay | Admitting: Family Medicine

## 2023-08-22 ENCOUNTER — Ambulatory Visit (INDEPENDENT_AMBULATORY_CARE_PROVIDER_SITE_OTHER): Payer: 59 | Admitting: Family Medicine

## 2023-08-22 VITALS — BP 132/84 | HR 132 | Temp 100.2°F | Wt 165.2 lb

## 2023-08-22 DIAGNOSIS — F1010 Alcohol abuse, uncomplicated: Secondary | ICD-10-CM | POA: Diagnosis not present

## 2023-08-22 DIAGNOSIS — M65331 Trigger finger, right middle finger: Secondary | ICD-10-CM

## 2023-08-22 DIAGNOSIS — I471 Supraventricular tachycardia, unspecified: Secondary | ICD-10-CM | POA: Insufficient documentation

## 2023-08-22 MED ORDER — NALTREXONE HCL 50 MG PO TABS: 50.0000 mg | ORAL_TABLET | Freq: Every day | ORAL | 2 refills | Status: DC

## 2023-08-22 MED ORDER — MUPIROCIN 2 % EX OINT: 1.0000 | TOPICAL_OINTMENT | Freq: Two times a day (BID) | CUTANEOUS | 0 refills | Status: DC

## 2023-08-22 MED ORDER — METOPROLOL SUCCINATE ER 25 MG PO TB24: 12.5000 mg | ORAL_TABLET | Freq: Every day | ORAL | 0 refills | Status: DC

## 2023-08-22 NOTE — Assessment & Plan Note (Signed)
Was doing OK with the 25mg , but feels like it could be stronger- will increase to 50mg  and recheck in 1 month.

## 2023-08-22 NOTE — Assessment & Plan Note (Signed)
Will start metoprolol and recheck 1 month. Call with any concerns.

## 2023-08-22 NOTE — Progress Notes (Signed)
BP 132/84   Pulse (!) 132   Temp 100.2 F (37.9 C) (Oral)   Wt 165 lb 3.2 oz (74.9 kg)   SpO2 97%   BMI 25.12 kg/m    Subjective:    Patient ID: Timothy Finley, male    DOB: [birth year], 53 y.o.   MRN: [id number]  HPI: Timothy Finley is a 53 y.o. male  Chief Complaint  Patient presents with   Heart Monitor     Patient says he is here to follow up from wearing [facility].   Fatigue   Finger Injury    Patient says at his last in office visit, he discussed with the provider about his middle finger on his R hand popped. Patient says he and the provider discussed a possibly referral to have it looked at. Patient would like to discuss further.    PALPITATIONS Duration: months Symptom description: feels weird, heart races, usually with alcohol use Duration of episode: 10-20 minutes Frequency:  intermittent Activity when event occurred: drinking alcohol Related to exertion: no Dyspnea: no Chest pain: yes Syncope: no Anxiety/stress: yes Nausea/vomiting: no Diaphoresis: no Coronary artery disease: no Congestive heart failure: no Arrhythmia:no Thyroid disease: no Caffeine intake: 1 cafienated beverage Status:  stable Treatments attempted:none  Relevant past medical, surgical, family and social history reviewed and updated as indicated. Interim medical history since our last visit reviewed. Allergies and medications reviewed and updated.  Review of Systems  Constitutional:  Positive for fatigue. Negative for activity change, appetite change, chills, diaphoresis, fever and unexpected weight change.  Respiratory: Negative.    Cardiovascular:  Positive for palpitations.  Musculoskeletal:  Positive for arthralgias. Negative for back pain, gait problem, joint swelling, myalgias, neck pain and neck stiffness.  Skin: Negative.   Neurological: Negative.   Psychiatric/Behavioral: Negative.      Per HPI unless specifically indicated above     Objective:    BP 132/84    Pulse (!) 132   Temp 100.2 F (37.9 C) (Oral)   Wt 165 lb 3.2 oz (74.9 kg)   SpO2 97%   BMI 25.12 kg/m   Wt Readings from Last 3 Encounters:  08/22/23 165 lb 3.2 oz (74.9 kg)  08/14/23 165 lb 11.2 oz (75.2 kg)  07/11/23 166 lb 6.4 oz (75.5 kg)    Physical Exam Vitals and nursing note reviewed.  Constitutional:      General: He is not in acute distress.    Appearance: Normal appearance. He is not ill-appearing, toxic-appearing or diaphoretic.  HENT:     Head: Normocephalic and atraumatic.     Right Ear: External ear normal.     Left Ear: External ear normal.     Nose: Nose normal.     Mouth/Throat:     Mouth: Mucous membranes are moist.     Pharynx: Oropharynx is clear.  Eyes:     General: No scleral icterus.       Right eye: No discharge.        Left eye: No discharge.     Extraocular Movements: Extraocular movements intact.     Conjunctiva/sclera: Conjunctivae normal.     Pupils: Pupils are equal, round, and reactive to light.  Cardiovascular:     Rate and Rhythm: Regular rhythm. Tachycardia present.     Pulses: Normal pulses.     Heart sounds: Normal heart sounds. No murmur heard.    No friction rub. No gallop.  Pulmonary:     Effort: Pulmonary effort is normal.  No respiratory distress.     Breath sounds: Normal breath sounds. No stridor. No wheezing, rhonchi or rales.  Chest:     Chest wall: No tenderness.  Musculoskeletal:        General: Normal range of motion.     Cervical back: Normal range of motion and neck supple.  Skin:    General: Skin is warm and dry.     Capillary Refill: Capillary refill takes less than 2 seconds.     Coloration: Skin is not jaundiced or pale.     Findings: No bruising, erythema, lesion or rash.  Neurological:     General: No focal deficit present.     Mental Status: He is alert and oriented to person, place, and time. Mental status is at baseline.  Psychiatric:        Mood and Affect: Mood normal.        Behavior: Behavior  normal.        Thought Content: Thought content normal.        Judgment: Judgment normal.     Results for orders placed or performed in visit on 08/14/23  Rapid Strep Screen ([facility] ONLY)   Collection Time: 08/14/23  1:48 PM   Specimen: Other   Other  Release to pati  Result Value Ref Range   Strep Gp A Ag, IA W/Reflex Negative Negative  Culture, Group A Strep   Collection Time: 08/14/23  1:48 PM   Other  Release to pati  Result Value Ref Range   Strep A Culture Negative   Veritor Flu A/B Waived   Collection Time: 08/14/23  1:48 PM  Result Value Ref Range   Influenza A Negative Negative   Influenza B Negative Negative  Novel Coronavirus, NAA ([facility])   Collection Time: 08/14/23  1:54 PM   Specimen: Nasopharyngeal(NP) swabs in vial transport medium  Result Value Ref Range   SARS-CoV-2, NAA Not Detected Not Detected      Assessment & Plan:   Problem List Items Addressed This Visit       Cardiovascular and Mediastinum   SVT (supraventricular tachycardia) (HCC) - Primary   Will start metoprolol and recheck 1 month. Call with any concerns.       Relevant Medications   metoprolol succinate (TOPROL-XL) 25 MG 24 hr tablet     Other   Alcohol abuse   Was doing OK with the 25mg , but feels like it could be stronger- will increase to 50mg  and recheck in 1 month.       Other Visit Diagnoses       Trigger middle finger of right hand       Referral to hand surgery placed today.   Relevant Orders   Ambulatory referral to Hand Surgery        Follow up plan: Return in about 4 weeks (around 09/19/2023).

## 2023-08-29 ENCOUNTER — Encounter: Payer: Self-pay | Admitting: Family Medicine

## 2023-09-08 NOTE — Telephone Encounter (Signed)
 Please see if we can get him in sooner

## 2023-09-26 ENCOUNTER — Encounter: Payer: Self-pay | Admitting: Family Medicine

## 2023-09-26 ENCOUNTER — Ambulatory Visit (INDEPENDENT_AMBULATORY_CARE_PROVIDER_SITE_OTHER): Payer: 59 | Admitting: Family Medicine

## 2023-09-26 VITALS — BP 132/84 | HR 98 | Temp 99.1°F | Resp 16 | Wt 165.0 lb

## 2023-09-26 DIAGNOSIS — F1010 Alcohol abuse, uncomplicated: Secondary | ICD-10-CM

## 2023-09-26 DIAGNOSIS — I471 Supraventricular tachycardia, unspecified: Secondary | ICD-10-CM | POA: Diagnosis not present

## 2023-09-26 DIAGNOSIS — F410 Panic disorder [episodic paroxysmal anxiety] without agoraphobia: Secondary | ICD-10-CM | POA: Diagnosis not present

## 2023-09-26 MED ORDER — CLONAZEPAM 0.5 MG PO TABS: 0.5000 mg | ORAL_TABLET | Freq: Two times a day (BID) | ORAL | 1 refills | Status: DC | PRN

## 2023-09-26 MED ORDER — METOPROLOL SUCCINATE ER 25 MG PO TB24: 12.5000 mg | ORAL_TABLET | Freq: Every day | ORAL | 1 refills | Status: DC

## 2023-09-26 MED ORDER — NALTREXONE HCL 50 MG PO TABS: 50.0000 mg | ORAL_TABLET | Freq: Every day | ORAL | 2 refills | Status: DC

## 2023-09-26 NOTE — Progress Notes (Signed)
 BP 132/84 (BP Location: Left Arm, Patient Position: Sitting, Cuff Size: Normal)   Pulse 98   Temp 99.1 F (37.3 C) (Oral)   Resp 16   Wt 165 lb (74.8 kg)   SpO2 99%   BMI 25.09 kg/m    Subjective:    Patient ID: Timothy Finley, male    DOB: Jan 28, 1971, 53 y.o.   MRN: [id number]  HPI: Timothy Finley is a 53 y.o. male  Chief Complaint  Patient presents with   SVT   PALPITATIONS- significantly better since starting the beta blocker Duration: months Symptom description:heart racing Duration of episode: minutes to hours Frequency: recurrentl Activity when event occurred:  after drinking Related to exertion: no Dyspnea: no Chest pain: no Syncope: no Anxiety/stress: no Nausea/vomiting: no Diaphoresis: no Coronary artery disease: no Congestive heart failure: no Arrhythmia:no Thyroid disease: no Caffeine intake: 1 cafienated beverage Status:  better Treatments attempted: beta blockers   Has not been taking his naltrexone. He was feeling more jittery when he was taking it, so he stopped taking it.   ANXIETY/STRESS Duration:exacerbated Anxious mood: yes  Excessive worrying: yes Irritability: yes  Sweating: no Nausea: no Palpitations:yes Hyperventilation: no Panic attacks: yes Agoraphobia: no  Obscessions/compulsions: no Depressed mood: no    09/26/2023    2:27 PM 08/22/2023    3:28 PM 08/14/2023    1:34 PM 07/11/2023    4:01 PM 07/05/2023    3:05 PM  Depression screen PHQ 2/9  Decreased Interest 0 0 1 1 0  Down, Depressed, Hopeless 1 1 1  0 0  PHQ - 2 Score 1 1 2 1  0  Altered sleeping 1 1 1 1  0  Tired, decreased energy 1 1 0 1 0  Change in appetite 0 0 0 0 0  Feeling bad or failure about yourself  0 0 0 0 0  Trouble concentrating 0 0 0 0 0  Moving slowly or fidgety/restless 0 0 0 0 0  Suicidal thoughts 0 0 0 0 0  PHQ-9 Score 3 3 3 3  0  Difficult doing work/chores Somewhat difficult Somewhat difficult Somewhat difficult Somewhat difficult        09/26/2023    2:27 PM 08/22/2023    3:28 PM 08/14/2023    1:34 PM 07/11/2023    4:01 PM  GAD 7 : Generalized Anxiety Score  Nervous, Anxious, on Edge 1 1 1 1   Control/stop worrying 1 1 0 0  Worry too much - different things 1 1 1 1   Trouble relaxing 0 0 1 0  Restless 0 0 1 0  Easily annoyed or irritable 0 0 1 1  Afraid - awful might happen 0 0 1 0  Total GAD 7 Score 3 3 6 3   Anxiety Difficulty Somewhat difficult Somewhat difficult Not difficult at all Somewhat difficult   Anhedonia: no Weight changes: no Insomnia: no   Hypersomnia: no Fatigue/loss of energy: yes Feelings of worthlessness: no Feelings of guilt: no Impaired concentration/indecisiveness: no Suicidal ideations: no  Crying spells: no Recent Stressors/Life Changes: no   Relationship problems: no   Family stress: yes     Financial stress: no    Job stress: yes    Recent death/loss: no   Relevant past medical, surgical, family and social history reviewed and updated as indicated. Interim medical history since our last visit reviewed. Allergies and medications reviewed and updated.  Review of Systems  Constitutional: Negative.   Respiratory: Negative.    Cardiovascular:  Positive for palpitations.  Negative for chest pain and leg swelling.  Gastrointestinal: Negative.   Musculoskeletal: Negative.   Neurological: Negative.   Psychiatric/Behavioral:  Negative for agitation, behavioral problems, confusion, decreased concentration, dysphoric mood, hallucinations, self-injury, sleep disturbance and suicidal ideas. The patient is nervous/anxious. The patient is not hyperactive.     Per HPI unless specifically indicated above     Objective:    BP 132/84 (BP Location: Left Arm, Patient Position: Sitting, Cuff Size: Normal)   Pulse 98   Temp 99.1 F (37.3 C) (Oral)   Resp 16   Wt 165 lb (74.8 kg)   SpO2 99%   BMI 25.09 kg/m   Wt Readings from Last 3 Encounters:  09/26/23 165 lb (74.8 kg)  08/22/23 165 lb 3.2  oz (74.9 kg)  08/14/23 165 lb 11.2 oz (75.2 kg)    Physical Exam Vitals and nursing note reviewed.  Constitutional:      General: He is not in acute distress.    Appearance: Normal appearance. He is not ill-appearing, toxic-appearing or diaphoretic.  HENT:     Head: Normocephalic and atraumatic.     Right Ear: External ear normal.     Left Ear: External ear normal.     Nose: Nose normal.     Mouth/Throat:     Mouth: Mucous membranes are moist.     Pharynx: Oropharynx is clear.  Eyes:     General: No scleral icterus.       Right eye: No discharge.        Left eye: No discharge.     Extraocular Movements: Extraocular movements intact.     Conjunctiva/sclera: Conjunctivae normal.     Pupils: Pupils are equal, round, and reactive to light.  Cardiovascular:     Rate and Rhythm: Normal rate and regular rhythm.     Pulses: Normal pulses.     Heart sounds: Normal heart sounds. No murmur heard.    No friction rub. No gallop.  Pulmonary:     Effort: Pulmonary effort is normal. No respiratory distress.     Breath sounds: Normal breath sounds. No stridor. No wheezing, rhonchi or rales.  Chest:     Chest wall: No tenderness.  Musculoskeletal:        General: Normal range of motion.     Cervical back: Normal range of motion and neck supple.  Skin:    General: Skin is warm and dry.     Capillary Refill: Capillary refill takes less than 2 seconds.     Coloration: Skin is not jaundiced or pale.     Findings: No bruising, erythema, lesion or rash.  Neurological:     General: No focal deficit present.     Mental Status: He is alert and oriented to person, place, and time. Mental status is at baseline.  Psychiatric:        Mood and Affect: Mood normal.        Behavior: Behavior normal.        Thought Content: Thought content normal.        Judgment: Judgment normal.     Results for orders placed or performed in visit on 08/14/23  Rapid Strep Screen ([facility] ONLY)   Collection  Time: 08/14/23  1:48 PM   Specimen: Other   Other  Release to pati  Result Value Ref Range   Strep Gp A Ag, IA W/Reflex Negative Negative  Culture, Group A Strep   Collection Time: 08/14/23  1:48 PM   Other  Release to pati  Result Value Ref Range   Strep A Culture Negative   Veritor Flu A/B Waived   Collection Time: 08/14/23  1:48 PM  Result Value Ref Range   Influenza A Negative Negative   Influenza B Negative Negative  Novel Coronavirus, NAA ([facility])   Collection Time: 08/14/23  1:54 PM   Specimen: Nasopharyngeal(NP) swabs in vial transport medium  Result Value Ref Range   SARS-CoV-2, NAA Not Detected Not Detected      Assessment & Plan:   Problem List Items Addressed This Visit       Cardiovascular and Mediastinum   SVT (supraventricular tachycardia) (HCC) - Primary   Under good control on current regimen. Continue current regimen. Continue to monitor. Call with any concerns. Refills given.        Relevant Medications   metoprolol succinate (TOPROL-XL) 25 MG 24 hr tablet     Other   Panic disorder   Under good control on current regimen. Continue current regimen. Continue to monitor. Call with any concerns. Refills given.        Relevant Medications   clonazePAM (KLONOPIN) 0.5 MG tablet   Alcohol abuse   Holding his naltrexone- will retry 1/2 dose. Referral to social work for additional resources placed. Call with any concerns.       Relevant Orders   AMB Referral [facility] Care Management     Follow up plan: Return in about 3 months (around 12/24/2023).

## 2023-09-27 ENCOUNTER — Telehealth: Payer: Self-pay | Admitting: *Deleted

## 2023-09-27 NOTE — Progress Notes (Signed)
 Complex Care Management Note Care Guide Note  09/27/2023 Name: Timothy Finley MRN: [id number] DOB: Oct 28, 1970   Complex Care Management Outreach Attempts: An unsuccessful telephone outreach was attempted today to offer the patient information about available complex care management services.  Follow Up Plan:  Additional outreach attempts will be made to offer the patient complex care management information and services.   Encounter Outcome:  No Answer  Burman Nieves, CMA, Care Guide [facility]  [facility], [facility] Guide Direct Dial: [phone]  Fax: [phone] Website: [email or web address]

## 2023-09-30 NOTE — Progress Notes (Unsigned)
 Complex Care Management Note Care Guide Note  09/30/2023 Name: Timothy Finley MRN: [id number] DOB: 14-Dec-1970   Complex Care Management Outreach Attempts: A second unsuccessful outreach was attempted today to offer the patient with information about available complex care management services.  Follow Up Plan:  Additional outreach attempts will be made to offer the patient complex care management information and services.   Encounter Outcome:  No Answer  Burman Nieves, CMA, Care Guide [facility]  [facility], [facility] Guide Direct Dial: [phone]  Fax: [phone] Website: [email or web address]

## 2023-09-30 NOTE — Assessment & Plan Note (Signed)
 Under good control on current regimen. Continue current regimen. Continue to monitor. Call with any concerns. Refills given.

## 2023-09-30 NOTE — Assessment & Plan Note (Signed)
 Holding his naltrexone- will retry 1/2 dose. Referral to social work for additional resources placed. Call with any concerns.

## 2023-10-02 NOTE — Progress Notes (Signed)
 Complex Care Management Note  Care Guide Note 10/02/2023 Name: [name] MRN: [id number] DOB: 1971-03-06  [name] is a 53 y.o. year old male who sees Dorcas Carrow, DO for primary care. I reached out to [name] by phone today to offer complex care management services.  Mr. Hickling was given information about Complex Care Management services today including:   The Complex Care Management services include support from the care team which includes your Nurse Care Manager, Clinical Social Worker, or Pharmacist.  The Complex Care Management team is here to help remove barriers to the health concerns and goals most important to you. Complex Care Management services are voluntary, and the patient may decline or stop services at any time by request to their care team member.   Complex Care Management Consent Status: Patient agreed to services and verbal consent obtained.   Follow up plan:  Telephone appointment with complex care management team member scheduled for:  10/14/2023  Encounter Outcome:  Patient Scheduled  Burman Nieves, CMA, Care Guide [facility]  [facility], [facility] Guide Direct Dial: [phone]  Fax: [phone] Website: [email or web address]

## 2023-10-02 NOTE — Progress Notes (Signed)
 Complex Care Management Note Care Guide Note  10/02/2023 Name: Timothy Finley MRN: [id number] DOB: 06-Feb-1971   Complex Care Management Outreach Attempts: A third unsuccessful outreach was attempted today to offer the patient with information about available complex care management services.  Follow Up Plan:  No further outreach attempts will be made at this time. We have been unable to contact the patient to offer or enroll patient in complex care management services.  Encounter Outcome:  No Answer  Burman Nieves, CMA, Care Guide [facility]  [facility], [facility] Guide Direct Dial: [phone]  Fax: [phone] Website: [email or web address]

## 2023-10-14 ENCOUNTER — Encounter: Payer: Self-pay | Admitting: *Deleted

## 2023-10-17 ENCOUNTER — Encounter: Payer: Self-pay | Admitting: *Deleted

## 2023-10-22 ENCOUNTER — Encounter: Payer: Self-pay | Admitting: *Deleted

## 2023-10-31 ENCOUNTER — Ambulatory Visit: Payer: Self-pay | Admitting: *Deleted

## 2023-10-31 NOTE — Patient Instructions (Signed)
 Visit Information  Thank you for taking time to visit with me today. Please don't hesitate to contact me if I can be of assistance to you.   Following are the goals we discussed today:   Goals Addressed             This Visit's Progress    resources for alcohol misuse       Activities and task to complete in order to accomplish goals.   Keep all upcoming appointment discussed today Continue with compliance of taking medication prescribed by Doctor Contact provider with any new questions or concerns Self Support options  ( review resources for ongoing treatment once received)         Our next appointment is by telephone on 11/14/23 at 2:30pm  Please call the care guide team at [phone] if you need to cancel or reschedule your appointment.   If you are experiencing a Mental Health or Behavioral Health Crisis or need someone to talk to, please call the Suicide and Crisis Lifeline: 988   Patient verbalizes understanding of instructions and care plan provided today and agrees to view in MyChart. Active MyChart status and patient understanding of how to access instructions and care plan via MyChart confirmed with patient.     Telephone follow up appointment with care management team member scheduled for: 11/14/23    Verna Czech, LCSW [place]  [facility], [facility] Licensed Clinical Social Worker Care Coordinator  Direct Dial: [phone]

## 2023-10-31 NOTE — Patient Outreach (Signed)
 Care Coordination   Initial Visit Note   10/31/2023 Name: Timothy Finley MRN: [id number] DOB: 06-Oct-1970  Timothy Finley is a 53 y.o. year old male who sees Timothy Carrow, DO for primary care. I spoke with  Timothy Finley by phone today.  What matters to the patients health and wellness today?  Resources for outpatient substance misuse     Goals Addressed             This Visit's Progress    resources for alcohol misuse       Activities and task to complete in order to accomplish goals.   Keep all upcoming appointment discussed today Continue with compliance of taking medication prescribed by Doctor Contact provider with any new questions or concerns Self Support options  ( review resources for ongoing treatment once received)         SDOH assessments and interventions completed:  Yes  SDOH Interventions Today    Flowsheet Row Most Recent Value  SDOH Interventions   Food Insecurity Interventions Intervention Not Indicated  Housing Interventions Intervention Not Indicated  Transportation Interventions Intervention Not Indicated  Utilities Interventions Intervention Not Indicated  Alcohol Usage Interventions Community Resources Provided, Alcohol Education/Brief Advice        Care Coordination Interventions:  Yes, provided  Interventions Today    Flowsheet Row Most Recent Value  Chronic Disease   Chronic disease during today's visit Other  [alcohol use]  General Interventions   General Interventions Discussed/Reviewed General Interventions Discussed, Science writer assessed for [employer] needs to address alcohol misuse- resources for ongoing treatment to be provided]  Mental Health Interventions   Mental Health Discussed/Reviewed Mental Health Discussed, Coping Strategies, Substance Abuse  [alcohol screen completed, patient to provided with community resources for ongoing support, alcohol ediucation/brief advice provided-pt agreeable to  outpt support, declines referral for CDIOP]  Pharmacy Interventions   Pharmacy Dicussed/Reviewed Pharmacy Topics Discussed, Medication Adherence  Medication Adherence Not taking medication  [discontinued naltrexone due to increased panic attacks-confirmed that patient's provider has been notified]       Follow up plan: Follow up call scheduled for 11/14/23    Encounter Outcome:  Patient Visit Completed

## 2023-11-14 ENCOUNTER — Other Ambulatory Visit: Payer: Self-pay

## 2023-11-14 ENCOUNTER — Ambulatory Visit: Payer: Self-pay | Admitting: *Deleted

## 2023-11-14 NOTE — Patient Outreach (Signed)
 Complex Care Management   Visit Note  11/14/2023  Name:  Timothy Finley MRN: [id number] DOB: [birth year]  Situation: Referral received for Complex Care Management related to SDOH Barriers:  Alcohol misuse  I obtained verbal consent from Patient.  Visit completed with patient  on the phone  Background:   Past Medical History:  Diagnosis Date   Allergic bronchitis    Anxiety    Kidney stones     Assessment: Patient Reported Symptoms:  Cognitive Cognitive Status: Alert and oriented to person, place, and time, Insightful and able to interpret abstract concepts, Normal speech and language skills Cognitive/Intellectual Conditions Management [RPT]: None reported or documented in medical history or problem list   Health Maintenance Behaviors: Annual physical exam Healing Pattern: Average  Neurological      HEENT HEENT Symptoms Reported: No symptoms reported      Cardiovascular Cardiovascular Symptoms Reported: No symptoms reported Does patient have uncontrolled Hypertension?: No    Respiratory Respiratory Symptoms Reported: No symptoms reported Respiratory Conditions: Asthma Respiratory Self-Management Outcome: 4 (good) Respiratory Comment: Respiratory symptoms managed with medications  Endocrine Patient reports the following symptoms related to hypoglycemia or hyperglycemia : No symptoms reported Is patient diabetic?: No    Gastrointestinal Gastrointestinal Symptoms Reported: No symptoms reported   Nutrition Risk Screen (CP): No indicators present  Genitourinary      Integumentary Integumentary Symptoms Reported: No symptoms reported    Musculoskeletal Musculoskelatal Symptoms Reviewed: Other Other Musculoskeletal Symptoms: chronic back pain Musculoskeletal Conditions: Back pain Musculoskeletal Management Strategies: Adequate rest Musculoskeletal Comment: patient reports being seen by a chiropractor Falls in the past year?: No Number of falls in past year: 1 or less Was  there an injury with Fall?: No Fall Risk Category Calculator: 0 Patient Fall Risk Level: Low Fall Risk Patient at Risk for Falls Due to: No Fall Risks  Psychosocial Psychosocial Symptoms Reported: Substance use Additional Psychological Details: patient interested in outpatient substance abuse treatment Behavioral Health Conditions: Substance use Substance Use: Alcohol Behavioral Management Strategies: Counseling Behavioral Health Comment: per patient, he no longer takes naltrexone-states that the medication triggers panic attacks-panic attacks stopped once medication stopped Techniques to Cope with Loss/Stress/Change: Counseling Quality of Family Relationships: supportive Do you feel physically threatened by others?: No      11/14/2023    3:15 PM  Depression screen PHQ 2/9  Decreased Interest 0  Down, Depressed, Hopeless 0  PHQ - 2 Score 0    There were no vitals filed for this visit.  Medications Reviewed Today     Reviewed by Wenda Overland, LCSW (Social Worker) on 11/14/23 at 1458  Med List Status: <None>   Medication Order Taking? Sig Documenting Provider Last Dose Status Informant  albuterol (PROVENTIL) (2.5 MG/3ML) 0.083% nebulizer solution [id number] Yes Take 3 mLs (2.5 mg total) by nebulization every 6 (six) hours as needed for wheezing or shortness of breath. Johnson, Megan P, DO Taking Active   albuterol (VENTOLIN HFA) 108 (90 Base) MCG/ACT inhaler [id number] Yes Inhale 1-2 puffs into the lungs every 6 (six) hours as needed for wheezing or shortness of breath. Johnson, Megan P, DO Taking Active   busPIRone (BUSPAR) 5 MG tablet [id number] Yes Take 1-2 tablets (5-10 mg total) by mouth 2 (two) times daily. Johnson, Megan P, DO Taking Active   cetirizine (ZYRTEC) 10 MG tablet [id number] Yes Take 10 mg by mouth at bedtime. [provider] Taking Active   clonazePAM (KLONOPIN) 0.5 MG tablet [id number] Yes Take 1 tablet (0.5 mg total)  by mouth 2 (two) times daily as needed  for anxiety. Johnson, Megan P, DO Taking Active   fluticasone (FLONASE) 50 MCG/ACT nasal spray [id number] Yes Place 2 sprays into both nostrils daily. [provider] Taking Active   fluticasone-salmeterol [facility] INHUB) 250-50 MCG/ACT AEPB [id number] Yes Inhale 1 puff into the lungs in the morning and at bedtime. Johnson, Megan P, DO Taking Active   metoprolol succinate (TOPROL-XL) 25 MG 24 hr tablet [id number] Yes Take 0.5 tablets (12.5 mg total) by mouth daily. Terre Ferri P, DO Taking Active   mupirocin ointment (BACTROBAN) 2 % [id number] No Apply 1 Application topically 2 (two) times daily.  Patient not taking: Reported on 11/14/2023   Solomon Dupre, DO Not Taking Active   naltrexone (DEPADE) 50 MG tablet [id number] No Take 1 tablet (50 mg total) by mouth daily.  Patient not taking: Reported on 11/14/2023   Solomon Dupre, DO Not Taking Active   rosuvastatin (CRESTOR) 40 MG tablet [id number] Yes Take 1 tablet (40 mg total) by mouth daily. Solomon Dupre, DO Taking Active             Recommendation:   CSW to follow up with patient following review of resources provided to coordinate ongoing mental health counseling  Follow Up Plan:   Telephone follow-up 11/28/23  Michaelle Adolphus, LCSW [place]  [facility], [facility] Licensed Clinical Social Worker Care Coordinator  Direct Dial: [phone]

## 2023-11-14 NOTE — Patient Instructions (Signed)
 Visit Information  Thank you for taking time to visit with me today. Please don't hesitate to contact me if I can be of assistance to you before our next scheduled appointment.  Our next appointment is by telephone on 11/28/23 at 10am Please call the care guide team at [phone] if you need to cancel or reschedule your appointment.   Following is a copy of your care plan:   Goals Addressed             This Visit's Progress    VBCI Social Work Care Plan       Problems:   Substance Misuse: alcohol  CSW Clinical Goal(s):   Over the next 90 days the Patient will attend all scheduled medical appointments as evidenced by patient report and care team review of appointment completion in electronic MEDICAL RECORD NUMBER  explore community resource options for unmet needs related to Alcohol/Substance Use.  Interventions:  Substance Misuse in a patient with  SVT :   Evaluation of current treatment plan related to misuse of: alcohol Behavioral Activation reviewed Depression screen reviewed Discussed referral options to connect for ongoing therapy:  Participation in counseling encourage :  Problem Solving /Task Center strategies reviewed  Patient Goals/Self-Care Activities:  Patient to schedule appointment with outpatient mental health provider-resources provided  Plan:   Telephone follow up appointment with care management team member scheduled for:  11/28/23        Please call the Suicide and Crisis Lifeline: 988 if you are experiencing a Mental Health or Behavioral Health Crisis or need someone to talk to.  Patient verbalizes understanding of instructions and care plan provided today and agrees to view in MyChart. Active MyChart status and patient understanding of how to access instructions and care plan via MyChart confirmed with patient.     Jhostin Epps, LCSW [place]  [facility], [facility] Licensed Clinical Social Worker Care Coordinator  Direct  Dial: [phone]

## 2023-11-28 ENCOUNTER — Telehealth: Payer: Self-pay | Admitting: *Deleted

## 2023-11-28 ENCOUNTER — Encounter: Payer: Self-pay | Admitting: *Deleted

## 2023-12-24 ENCOUNTER — Ambulatory Visit: Payer: 59 | Admitting: Family Medicine

## 2023-12-26 ENCOUNTER — Telehealth: Payer: Self-pay | Admitting: *Deleted

## 2023-12-26 NOTE — Progress Notes (Signed)
 Complex Care Management Care Guide Note  12/26/2023 Name: Timothy Finley MRN: [id number] DOB: 06-26-1971  [name] is a 53 y.o. year old male who is a primary care patient of Solomon Dupre, DO and is actively engaged with the care management team. I reached out to Dorsey Gault by phone today to assist with re-scheduling  with the Licensed Clinical Child psychotherapist.  Follow up plan: Unsuccessful telephone outreach attempt made. A HIPAA compliant phone message was left for the patient providing contact information and requesting a return call.  Kandis Ormond, CMA [place]  [facility], [facility] Guide Direct Dial: [phone]  Fax: [phone] Website: [email or web address]

## 2024-01-16 ENCOUNTER — Other Ambulatory Visit: Payer: Self-pay | Admitting: Family Medicine

## 2024-01-17 NOTE — Telephone Encounter (Signed)
 Requested Prescriptions  Pending Prescriptions Disp Refills   rosuvastatin  (CRESTOR ) 40 MG tablet [Pharmacy Med Name: ROSUVASTATIN  CALCIUM  40 MG TAB] 90 tablet 1    Sig: TAKE 1 TABLET BY MOUTH EVERY DAY     Cardiovascular:  Antilipid - Statins 2 Failed - 01/17/2024  3:34 PM      Failed - Lipid Panel in normal range within the last 12 months    Cholesterol, Total  Date Value Ref Range Status  07/11/2023 121 100 - 199 mg/dL Final   LDL Chol Calc (NIH)  Date Value Ref Range Status  07/11/2023 38 0 - 99 mg/dL Final   HDL  Date Value Ref Range Status  07/11/2023 42 >39 mg/dL Final   Triglycerides  Date Value Ref Range Status  07/11/2023 272 (H) 0 - 149 mg/dL Final         Passed - Cr in normal range and within 360 days    Creatinine  Date Value Ref Range Status  06/07/2022 88.2 20.0 - 300.0 mg/dL Final  16/04/9603 5.40 0.60 - 1.30 mg/dL Final   Creatinine, Ser  Date Value Ref Range Status  07/11/2023 0.92 0.76 - 1.27 mg/dL Final         Passed - Patient is not pregnant      Passed - Valid encounter within last 12 months    Recent Outpatient Visits           3 months ago SVT (supraventricular tachycardia) (HCC)   [place] [facility] [place], [place], DO

## 2024-01-28 ENCOUNTER — Ambulatory Visit (INDEPENDENT_AMBULATORY_CARE_PROVIDER_SITE_OTHER): Admitting: Family Medicine

## 2024-01-28 VITALS — BP 135/91 | HR 108 | Temp 98.0°F | Ht 68.0 in | Wt 156.0 lb

## 2024-01-28 DIAGNOSIS — F410 Panic disorder [episodic paroxysmal anxiety] without agoraphobia: Secondary | ICD-10-CM

## 2024-01-28 DIAGNOSIS — E782 Mixed hyperlipidemia: Secondary | ICD-10-CM

## 2024-01-28 DIAGNOSIS — Z789 Other specified health status: Secondary | ICD-10-CM | POA: Diagnosis not present

## 2024-01-28 DIAGNOSIS — R5382 Chronic fatigue, unspecified: Secondary | ICD-10-CM | POA: Diagnosis not present

## 2024-01-28 DIAGNOSIS — I471 Supraventricular tachycardia, unspecified: Secondary | ICD-10-CM | POA: Diagnosis not present

## 2024-01-28 MED ORDER — METOPROLOL SUCCINATE ER 25 MG PO TB24: 25.0000 mg | ORAL_TABLET | Freq: Every day | ORAL | 1 refills | Status: DC

## 2024-01-28 MED ORDER — CLONAZEPAM 0.5 MG PO TABS: 0.5000 mg | ORAL_TABLET | Freq: Two times a day (BID) | ORAL | 1 refills | Status: DC | PRN

## 2024-01-28 MED ORDER — BUSPIRONE HCL 5 MG PO TABS: 5.0000 mg | ORAL_TABLET | Freq: Two times a day (BID) | ORAL | 1 refills | Status: AC

## 2024-01-28 NOTE — Assessment & Plan Note (Signed)
 Under good control on current regimen. Continue current regimen. Continue to monitor. Call with any concerns. Refills given for 3 months. Follow up 3 months.

## 2024-01-28 NOTE — Assessment & Plan Note (Signed)
 Still running high. Will increase his metoprolol  to 25mg  and recheck in 3 months. Call with any concerns.

## 2024-01-28 NOTE — Assessment & Plan Note (Signed)
 Under good control on current regimen. Continue current regimen. Continue to monitor. Call with any concerns. Refills given. Labs drawn today.

## 2024-01-28 NOTE — Progress Notes (Signed)
 BP (!) 135/91 (BP Location: Left Arm, Patient Position: Sitting, Cuff Size: Normal)   Pulse (!) 108   Temp 98 F (36.7 C) (Oral)   Ht 5' 8 (1.727 m)   Wt 156 lb (70.8 kg)   SpO2 98%   BMI 23.72 kg/m    Subjective:    Patient ID: Timothy Finley, male    DOB: [birth year], 53 y.o.   MRN: [id number]  HPI: Timothy Finley is a 53 y.o. male  Chief Complaint  Patient presents with   SVT   ANXIETY/STRESS Duration: chronic Status:stable Anxious mood: yes  Excessive worrying: no Irritability: no  Sweating: no Nausea: no Palpitations:no Hyperventilation: no Panic attacks: yes Agoraphobia: no  Obscessions/compulsions: no Depressed mood: no    01/28/2024    4:09 PM 11/14/2023    3:15 PM 10/31/2023    3:31 PM 09/26/2023    2:27 PM 08/22/2023    3:28 PM  Depression screen PHQ 2/9  Decreased Interest 1 0 0 0 0  Down, Depressed, Hopeless 1 0 0 1 1  PHQ - 2 Score 2 0 0 1 1  Altered sleeping 1   1 1   Tired, decreased energy 0   1 1  Change in appetite 0   0 0  Feeling bad or failure about yourself  1   0 0  Trouble concentrating 0   0 0  Moving slowly or fidgety/restless 0   0 0  Suicidal thoughts 0   0 0  PHQ-9 Score 4   3 3   Difficult doing work/chores Somewhat difficult   Somewhat difficult Somewhat difficult   Anhedonia: no Weight changes: no Insomnia: no   Hypersomnia: no Fatigue/loss of energy: no Feelings of worthlessness: no Feelings of guilt: no Impaired concentration/indecisiveness: no Suicidal ideations: no  Crying spells: no Recent Stressors/Life Changes: no   Relationship problems: no   Family stress: yes     Financial stress: no    Job stress: no    Recent death/loss: yes  HYPERLIPIDEMIA Hyperlipidemia status: excellent compliance Satisfied with current treatment?  yes Side effects:  no Medication compliance: excellent compliance Past cholesterol meds: crestor  Supplements: none Aspirin:  no The ASCVD Risk score (Arnett DK, et al., 2019) failed  to calculate for the following reasons:   The valid total cholesterol range is 130 to 320 mg/dL Chest pain:  no Coronary artery disease:  no  PALPITATIONS Duration: chronic Symptom description: doesn't feel it, but heart races Duration of episode: unknown Frequency: recurrentl Activity when event occurred: unknown Related to exertion: yes Dyspnea: no Chest pain: no Syncope: no Anxiety/stress: yes Nausea/vomiting: no Diaphoresis: no Coronary artery disease: no Congestive heart failure: no Arrhythmia:yes Thyroid disease: no Caffeine intake: 1 cafienated beverage Status:  better Treatments attempted:metoprolol   Relevant past medical, surgical, family and social history reviewed and updated as indicated. Interim medical history since our last visit reviewed. Allergies and medications reviewed and updated.  Review of Systems  Constitutional: Negative.   Respiratory: Negative.    Cardiovascular: Negative.   Gastrointestinal: Negative.   Musculoskeletal: Negative.   Neurological: Negative.   Psychiatric/Behavioral: Negative.      Per HPI unless specifically indicated above     Objective:    BP (!) 135/91 (BP Location: Left Arm, Patient Position: Sitting, Cuff Size: Normal)   Pulse (!) 108   Temp 98 F (36.7 C) (Oral)   Ht 5' 8 (1.727 m)   Wt 156 lb (70.8 kg)   SpO2 98%  BMI 23.72 kg/m   Wt Readings from Last 3 Encounters:  01/28/24 156 lb (70.8 kg)  09/26/23 165 lb (74.8 kg)  08/22/23 165 lb 3.2 oz (74.9 kg)    Physical Exam Vitals and nursing note reviewed.  Constitutional:      General: He is not in acute distress.    Appearance: Normal appearance. He is not ill-appearing, toxic-appearing or diaphoretic.  HENT:     Head: Normocephalic and atraumatic.     Right Ear: External ear normal.     Left Ear: External ear normal.     Nose: Nose normal.     Mouth/Throat:     Mouth: Mucous membranes are moist.     Pharynx: Oropharynx is clear.   Eyes:      General: No scleral icterus.       Right eye: No discharge.        Left eye: No discharge.     Extraocular Movements: Extraocular movements intact.     Conjunctiva/sclera: Conjunctivae normal.     Pupils: Pupils are equal, round, and reactive to light.    Cardiovascular:     Rate and Rhythm: Normal rate and regular rhythm.     Pulses: Normal pulses.     Heart sounds: Normal heart sounds. No murmur heard.    No friction rub. No gallop.  Pulmonary:     Effort: Pulmonary effort is normal. No respiratory distress.     Breath sounds: Normal breath sounds. No stridor. No wheezing, rhonchi or rales.  Chest:     Chest wall: No tenderness.   Musculoskeletal:        General: Normal range of motion.     Cervical back: Normal range of motion and neck supple.   Skin:    General: Skin is warm and dry.     Capillary Refill: Capillary refill takes less than 2 seconds.     Coloration: Skin is not jaundiced or pale.     Findings: No bruising, erythema, lesion or rash.   Neurological:     General: No focal deficit present.     Mental Status: He is alert and oriented to person, place, and time. Mental status is at baseline.   Psychiatric:        Mood and Affect: Mood normal.        Behavior: Behavior normal.        Thought Content: Thought content normal.        Judgment: Judgment normal.     Results for orders placed or performed in visit on 08/14/23  Rapid Strep Screen ([facility] ONLY)   Collection Time: 08/14/23  1:48 PM   Specimen: Other   Other  Release to pati  Result Value Ref Range   Strep Gp A Ag, IA W/Reflex Negative Negative  Culture, Group A Strep   Collection Time: 08/14/23  1:48 PM   Other  Release to pati  Result Value Ref Range   Strep A Culture Negative   Veritor Flu A/B Waived   Collection Time: 08/14/23  1:48 PM  Result Value Ref Range   Influenza A Negative Negative   Influenza B Negative Negative  Novel Coronavirus, NAA ([facility])   Collection Time:  08/14/23  1:54 PM   Specimen: Nasopharyngeal(NP) swabs in vial transport medium  Result Value Ref Range   SARS-CoV-2, NAA Not Detected Not Detected      Assessment & Plan:   Problem List Items Addressed This Visit  Cardiovascular and Mediastinum   SVT (supraventricular tachycardia) (HCC)   Still running high. Will increase his metoprolol  to 25mg  and recheck in 3 months. Call with any concerns.         Other   Panic disorder   Under good control on current regimen. Continue current regimen. Continue to monitor. Call with any concerns. Refills given for 3 months. Follow up 3 months.        Hyperlipidemia - Primary   Under good control on current regimen. Continue current regimen. Continue to monitor. Call with any concerns. Refills given. Labs drawn today.        Relevant Orders   CBC with Differential/Platelet   Comprehensive metabolic panel with GFR   Lipid Panel w/o Chol/HDL Ratio   Other Visit Diagnoses       Hepatitis B vaccination status unknown       Will check on vaccination status. Await results.   Relevant Orders   Hepatitis B surface antibody,quantitative     Chronic fatigue       Will check labs and testosterone. Await results.        Follow up plan: Return in about 3 months (around 04/29/2024).

## 2024-01-29 LAB — CBC WITH DIFFERENTIAL/PLATELET
Basophils Absolute: 0.2 10*3/uL (ref 0.0–0.2)
Basos: 1 %
EOS (ABSOLUTE): 0.2 10*3/uL (ref 0.0–0.4)
Eos: 1 %
Hematocrit: 46.2 % (ref 37.5–51.0)
Hemoglobin: 15.7 g/dL (ref 13.0–17.7)
Immature Grans (Abs): 0.4 10*3/uL — ABNORMAL HIGH (ref 0.0–0.1)
Immature Granulocytes: 3 %
Lymphocytes Absolute: 2.7 10*3/uL (ref 0.7–3.1)
Lymphs: 21 %
MCH: 33 pg (ref 26.6–33.0)
MCHC: 34 g/dL (ref 31.5–35.7)
MCV: 97 fL (ref 79–97)
Monocytes Absolute: 1.1 10*3/uL — ABNORMAL HIGH (ref 0.1–0.9)
Monocytes: 9 %
Neutrophils Absolute: 8.2 10*3/uL — ABNORMAL HIGH (ref 1.4–7.0)
Neutrophils: 65 %
Platelets: 259 10*3/uL (ref 150–450)
RBC: 4.76 x10E6/uL (ref 4.14–5.80)
RDW: 12.9 % (ref 11.6–15.4)
WBC: 12.7 10*3/uL — ABNORMAL HIGH (ref 3.4–10.8)

## 2024-01-29 LAB — COMPREHENSIVE METABOLIC PANEL WITH GFR
ALT: 93 IU/L — ABNORMAL HIGH (ref 0–44)
AST: 52 IU/L — ABNORMAL HIGH (ref 0–40)
Albumin: 4.9 g/dL (ref 3.8–4.9)
Alkaline Phosphatase: 115 IU/L (ref 44–121)
BUN/Creatinine Ratio: 13 (ref 9–20)
BUN: 12 mg/dL (ref 6–24)
Bilirubin Total: 0.5 mg/dL (ref 0.0–1.2)
CO2: 19 mmol/L — ABNORMAL LOW (ref 20–29)
Calcium: 9.8 mg/dL (ref 8.7–10.2)
Chloride: 100 mmol/L (ref 96–106)
Creatinine, Ser: 0.92 mg/dL (ref 0.76–1.27)
Globulin, Total: 2.7 g/dL (ref 1.5–4.5)
Glucose: 102 mg/dL — ABNORMAL HIGH (ref 70–99)
Potassium: 4.3 mmol/L (ref 3.5–5.2)
Sodium: 138 mmol/L (ref 134–144)
Total Protein: 7.6 g/dL (ref 6.0–8.5)
eGFR: 100 mL/min/{1.73_m2} (ref >59)

## 2024-01-29 LAB — LIPID PANEL W/O CHOL/HDL RATIO
Cholesterol, Total: 199 mg/dL (ref 100–199)
HDL: 71 mg/dL (ref >39)
LDL Chol Calc (NIH): 66 mg/dL (ref 0–99)
Triglycerides: 406 mg/dL — ABNORMAL HIGH (ref 0–149)
VLDL Cholesterol Cal: 62 mg/dL — ABNORMAL HIGH (ref 5–40)

## 2024-01-29 LAB — HEPATITIS B SURFACE ANTIBODY, QUANTITATIVE: Hepatitis B Surf Ab Quant: <3.5 m[IU]/mL — ABNORMAL LOW

## 2024-02-02 ENCOUNTER — Ambulatory Visit: Payer: Self-pay | Admitting: Family Medicine

## 2024-02-02 DIAGNOSIS — D72829 Elevated white blood cell count, unspecified: Secondary | ICD-10-CM

## 2024-02-23 ENCOUNTER — Other Ambulatory Visit: Payer: Self-pay | Admitting: Family Medicine

## 2024-02-23 DIAGNOSIS — F410 Panic disorder [episodic paroxysmal anxiety] without agoraphobia: Secondary | ICD-10-CM

## 2024-02-25 NOTE — Telephone Encounter (Signed)
 Requested Prescriptions  Refused Prescriptions Disp Refills   busPIRone  (BUSPAR ) 5 MG tablet [Pharmacy Med Name: BUSPIRONE  HCL 5 MG TABLET] 360 tablet 1    Sig: TAKE 1-2 TABLETS (5-10 MG TOTAL) BY MOUTH 2 (TWO) TIMES DAILY.     Psychiatry: Anxiolytics/Hypnotics - Non-controlled Passed - 02/25/2024  7:47 AM      Passed - Valid encounter within last 12 months    Recent Outpatient Visits           4 weeks ago Mixed hyperlipidemia   [facility] [place], Megan P, DO   5 months ago SVT (supraventricular tachycardia) [facility])   [place] [facility] [place], [place], DO

## 2024-03-10 DIAGNOSIS — M25562 Pain in left knee: Secondary | ICD-10-CM | POA: Diagnosis not present

## 2024-03-23 ENCOUNTER — Inpatient Hospital Stay

## 2024-03-23 ENCOUNTER — Encounter: Payer: Self-pay | Admitting: Oncology

## 2024-03-23 ENCOUNTER — Inpatient Hospital Stay: Attending: Oncology | Admitting: Oncology

## 2024-03-23 VITALS — BP 120/82 | HR 100 | Temp 98.6°F | Resp 18 | Ht 68.0 in | Wt 160.0 lb

## 2024-03-23 DIAGNOSIS — D72829 Elevated white blood cell count, unspecified: Secondary | ICD-10-CM | POA: Diagnosis not present

## 2024-03-23 DIAGNOSIS — Z885 Allergy status to narcotic agent status: Secondary | ICD-10-CM | POA: Diagnosis not present

## 2024-03-23 DIAGNOSIS — M25562 Pain in left knee: Secondary | ICD-10-CM | POA: Diagnosis not present

## 2024-03-23 DIAGNOSIS — Z79899 Other long term (current) drug therapy: Secondary | ICD-10-CM | POA: Diagnosis not present

## 2024-03-23 DIAGNOSIS — F17210 Nicotine dependence, cigarettes, uncomplicated: Secondary | ICD-10-CM | POA: Insufficient documentation

## 2024-03-23 DIAGNOSIS — C9110 Chronic lymphocytic leukemia of B-cell type not having achieved remission: Secondary | ICD-10-CM | POA: Insufficient documentation

## 2024-03-23 DIAGNOSIS — J45909 Unspecified asthma, uncomplicated: Secondary | ICD-10-CM | POA: Diagnosis not present

## 2024-03-23 LAB — CBC WITH DIFFERENTIAL/PLATELET
Abs Immature Granulocytes: 0.34 K/uL — ABNORMAL HIGH (ref 0.00–0.07)
Basophils Absolute: 0.1 K/uL (ref 0.0–0.1)
Basophils Relative: 1 %
Eosinophils Absolute: 0.3 K/uL (ref 0.0–0.5)
Eosinophils Relative: 3 %
HCT: 44.2 % (ref 39.0–52.0)
Hemoglobin: 15.3 g/dL (ref 13.0–17.0)
Immature Granulocytes: 3 %
Lymphocytes Relative: 23 %
Lymphs Abs: 2.6 K/uL (ref 0.7–4.0)
MCH: 32.4 pg (ref 26.0–34.0)
MCHC: 34.6 g/dL (ref 30.0–36.0)
MCV: 93.6 fL (ref 80.0–100.0)
Monocytes Absolute: 0.9 K/uL (ref 0.1–1.0)
Monocytes Relative: 8 %
Neutro Abs: 7 K/uL (ref 1.7–7.7)
Neutrophils Relative %: 62 %
Platelets: 272 K/uL (ref 150–400)
RBC: 4.72 MIL/uL (ref 4.22–5.81)
RDW: 12.5 % (ref 11.5–15.5)
WBC: 11.3 K/uL — ABNORMAL HIGH (ref 4.0–10.5)
nRBC: 0 % (ref 0.0–0.2)

## 2024-03-23 NOTE — Progress Notes (Unsigned)
 Patient is doing well, not having any symptoms as of now.

## 2024-03-23 NOTE — Progress Notes (Unsigned)
 [facility]  Telephone:([phone] Fax:(336) [phone]  ID: Timothy Finley OB: 10-Jun-1971  MR#: [id number]  RDW#:[id number]  Patient Care Team: Vicci Duwaine SQUIBB, DO as PCP - General (Family Medicine) Jacobo Evalene PARAS, MD as Consulting Physician (Oncology)  CHIEF COMPLAINT: Leukocytosis, unspecified.  INTERVAL HISTORY: Patient is a 53 year old male who was noted to have a chronically elevated total white blood cell count and is referred for further evaluation.  He currently feels well and is asymptomatic.  He has no neurologic complaints.  He denies any recent fevers or illnesses.  He has a good appetite and denies weight loss.  He has no chest pain, shortness of breath, cough, or hemoptysis.  He denies any nausea, vomiting, constipation, or diarrhea.  He has no urinary complaints.  Patient offers no specific complaints today.  REVIEW OF SYSTEMS:   Review of Systems  Constitutional: Negative.  Negative for fever, malaise/fatigue and weight loss.  Respiratory: Negative.  Negative for cough, hemoptysis and shortness of breath.   Cardiovascular: Negative.  Negative for chest pain and leg swelling.  Gastrointestinal: Negative.  Negative for abdominal pain.  Genitourinary: Negative.  Negative for dysuria.  Musculoskeletal: Negative.  Negative for back pain.  Skin: Negative.  Negative for rash.  Neurological: Negative.  Negative for dizziness, focal weakness, weakness and headaches.  Psychiatric/Behavioral: Negative.  The patient is not nervous/anxious.     As per HPI. Otherwise, a complete review of systems is negative.  PAST MEDICAL HISTORY: Past Medical History:  Diagnosis Date   Allergic bronchitis    Allergy    Anxiety    Asthma    Kidney stones     PAST SURGICAL HISTORY: Past Surgical History:  Procedure Laterality Date   HERNIA REPAIR     INSERTION OF MESH  05/23/2022   Procedure: INSERTION OF MESH;  Surgeon: Lane Shope, MD;  Location: [facility]  ORS;  Service: General;;   WISDOM TOOTH EXTRACTION      FAMILY HISTORY: Family History  Problem Relation Age of Onset   Cancer Maternal Grandmother        Breast   Cancer Maternal Grandfather    Aneurysm Paternal Grandfather        Brain    ADVANCED DIRECTIVES (Y/N):  N  HEALTH MAINTENANCE: Social History   Tobacco Use   Smoking status: Every Day    Current packs/day: 1.00    Average packs/day: 1 pack/day for 35.6 years (35.6 ttl pk-yrs)    Types: Cigarettes    Start date: 07/30/1988    Passive exposure: Past   Smokeless tobacco: Former  Building services engineer status: Never Used  Substance Use Topics   Alcohol use: Yes    Alcohol/week: 49.0 standard drinks of alcohol    Types: 42 Cans of beer, 7 Shots of liquor per week    Comment: 4-6 beers daily   Drug use: Yes    Types: Marijuana    Comment: maybe once or twice a month      Colonoscopy:  PAP:  Bone density:  Lipid panel:  Allergies  Allergen Reactions   Morphine Other (See Comments)    Morphine oral medication. Patient was unable to move his body and unable to speak that lasted about 30 minutes. Per patient , it is sleep paralysis     Current Outpatient Medications  Medication Sig Dispense Refill   albuterol  (PROVENTIL ) (2.5 MG/3ML) 0.083% nebulizer solution Take 3 mLs (2.5 mg total) by nebulization every 6 (six) hours as  needed for wheezing or shortness of breath. 150 mL 1   albuterol  (VENTOLIN  HFA) 108 (90 Base) MCG/ACT inhaler Inhale 1-2 puffs into the lungs every 6 (six) hours as needed for wheezing or shortness of breath. 3 each 3   busPIRone  (BUSPAR ) 5 MG tablet Take 1-2 tablets (5-10 mg total) by mouth 2 (two) times daily. 180 tablet 1   cetirizine (ZYRTEC) 10 MG tablet Take 10 mg by mouth at bedtime.     clonazePAM  (KLONOPIN ) 0.5 MG tablet Take 1 tablet (0.5 mg total) by mouth 2 (two) times daily as needed for anxiety. 30 tablet 1   fluticasone  (FLONASE) 50 MCG/ACT nasal spray Place 2 sprays into both  nostrils daily.     fluticasone -salmeterol (WIXELA INHUB) 250-50 MCG/ACT AEPB Inhale 1 puff into the lungs in the morning and at bedtime. 60 each 12   metoprolol  succinate (TOPROL -XL) 25 MG 24 hr tablet Take 1 tablet (25 mg total) by mouth daily. 90 tablet 1   naltrexone  (DEPADE) 50 MG tablet Take 1 tablet (50 mg total) by mouth daily. 30 tablet 2   rosuvastatin  (CRESTOR ) 40 MG tablet TAKE 1 TABLET BY MOUTH EVERY DAY 90 tablet 1   No current facility-administered medications for this visit.    OBJECTIVE: Vitals:   03/23/24 1344  BP: 120/82  Pulse: 100  Resp: 18  Temp: 98.6 F (37 C)  SpO2: 100%     Body mass index is 24.33 kg/m.    ECOG FS:0 - Asymptomatic  General: Well-developed, well-nourished, no acute distress. Eyes: Pink conjunctiva, anicteric sclera. HEENT: Normocephalic, moist mucous membranes. Lungs: No audible wheezing or coughing. Heart: Regular rate and rhythm. Abdomen: Soft, nontender, no obvious distention. Musculoskeletal: No edema, cyanosis, or clubbing. Neuro: Alert, answering all questions appropriately. Cranial nerves grossly intact. Skin: No rashes or petechiae noted. Psych: Normal affect. Lymphatics: No cervical, calvicular, axillary or inguinal LAD.   LAB RESULTS:  Lab Results  Component Value Date   NA 138 01/28/2024   K 4.3 01/28/2024   CL 100 01/28/2024   CO2 19 (L) 01/28/2024   GLUCOSE 102 (H) 01/28/2024   BUN 12 01/28/2024   CREATININE 0.92 01/28/2024   CALCIUM  9.8 01/28/2024   PROT 7.6 01/28/2024   ALBUMIN 4.9 01/28/2024   AST 52 (H) 01/28/2024   ALT 93 (H) 01/28/2024   ALKPHOS 115 01/28/2024   BILITOT 0.5 01/28/2024   GFRNONAA >60 05/18/2022   GFRAA 121 02/17/2020    Lab Results  Component Value Date   WBC 11.3 (H) 03/23/2024   NEUTROABS 7.0 03/23/2024   HGB 15.3 03/23/2024   HCT 44.2 03/23/2024   MCV 93.6 03/23/2024   PLT 272 03/23/2024     STUDIES: No results found.  ASSESSMENT: Leukocytosis, unspecified.  PLAN:     Leukocytosis, unspecified: Patient's total white blood cell count is mildly elevated today at 11.3.  Upon review of patient's chart, patient's total white blood cell count has been intermittently elevated since at least January 2013.  Peripheral blood flow cytometry and BCR-ABL mutation were drawn today for completeness and are pending at time of dictation.  No intervention is needed.  Patient does not require bone marrow biopsy.  He will have a video-assisted telemedicine visit in 3 weeks to discuss the results.  I spent a total of 45 minutes reviewing chart data, face-to-face evaluation with the patient, counseling and coordination of care as detailed above.   Patient expressed understanding and was in agreement with this plan. He also understands that  He can call clinic at any time with any questions, concerns, or complaints.    Evalene JINNY Reusing, MD   03/24/2024 12:38 PM

## 2024-03-24 ENCOUNTER — Ambulatory Visit: Admitting: Nurse Practitioner

## 2024-03-24 ENCOUNTER — Other Ambulatory Visit

## 2024-03-26 DIAGNOSIS — S83412A Sprain of medial collateral ligament of left knee, initial encounter: Secondary | ICD-10-CM | POA: Diagnosis not present

## 2024-03-26 DIAGNOSIS — S8002XA Contusion of left knee, initial encounter: Secondary | ICD-10-CM | POA: Diagnosis not present

## 2024-03-26 DIAGNOSIS — S76112A Strain of left quadriceps muscle, fascia and tendon, initial encounter: Secondary | ICD-10-CM | POA: Diagnosis not present

## 2024-03-26 LAB — BCR-ABL1, CML/ALL, PCR, QUANT
E1A2 Transcript: <0.0032 %
Interpretation (BCRAL):: NEGATIVE
b2a2 transcript: <0.0032 %
b3a2 transcript: <0.0032 %

## 2024-03-27 LAB — COMP PANEL: LEUKEMIA/LYMPHOMA: Immunophenotypic Profile: 0

## 2024-04-01 ENCOUNTER — Encounter: Payer: Self-pay | Admitting: Oncology

## 2024-04-03 ENCOUNTER — Inpatient Hospital Stay: Attending: Oncology | Admitting: Oncology

## 2024-04-03 DIAGNOSIS — C9110 Chronic lymphocytic leukemia of B-cell type not having achieved remission: Secondary | ICD-10-CM | POA: Diagnosis not present

## 2024-04-03 DIAGNOSIS — D72829 Elevated white blood cell count, unspecified: Secondary | ICD-10-CM

## 2024-04-03 NOTE — Progress Notes (Signed)
 [facility]  Telephone:(336) [phone] Fax:(336) [phone]  ID: Timothy Finley OB: 1971/07/25  MR#: [id number]  RDW#:[id number]  Timothy Finley Care Team: Vicci Duwaine SQUIBB, DO as PCP - General (Family Medicine) Jacobo Evalene PARAS, MD as Consulting Physician (Oncology)  I connected with Timothy Finley on 04/03/24 at  9:00 AM EDT by video enabled telemedicine visit and verified that I am speaking with the correct person using two identifiers.   I discussed the limitations, risks, security and privacy concerns of performing an evaluation and management service by telemedicine and the availability of in-person appointments. I also discussed with the Timothy Finley that there may be a Timothy Finley responsible charge related to this service. The Timothy Finley expressed understanding and agreed to proceed.   Other persons participating in the visit and their role in the encounter: Patient, MD.  Timothy Finley's location: Home. Provider's location: Clinic.  CHIEF COMPLAINT: CLL, less than 1% leukocytes.  INTERVAL HISTORY: Timothy Finley agreed to video-assisted telemedicine visit for further evaluation and discussion of his laboratory results.  He continues to feel well and remains asymptomatic.  He has no neurologic complaints.  He denies any recent fevers or illnesses.  He has a good appetite and denies weight loss.  He has no chest pain, shortness of breath, cough, or hemoptysis.  He denies any nausea, vomiting, constipation, or diarrhea.  He has no urinary complaints.  Timothy Finley offers no specific complaints today.  REVIEW OF SYSTEMS:   Review of Systems  Constitutional: Negative.  Negative for fever, malaise/fatigue and weight loss.  Respiratory: Negative.  Negative for cough, hemoptysis and shortness of breath.   Cardiovascular: Negative.  Negative for chest pain and leg swelling.  Gastrointestinal: Negative.  Negative for abdominal pain.  Genitourinary: Negative.  Negative for dysuria.  Musculoskeletal:  Negative.  Negative for back pain.  Skin: Negative.  Negative for rash.  Neurological: Negative.  Negative for dizziness, focal weakness, weakness and headaches.  Psychiatric/Behavioral: Negative.  The Timothy Finley is not nervous/anxious.     As per HPI. Otherwise, a complete review of systems is negative.  PAST MEDICAL HISTORY: Past Medical History:  Diagnosis Date   Allergic bronchitis    Allergy    Anxiety    Asthma    Kidney stones     PAST SURGICAL HISTORY: Past Surgical History:  Procedure Laterality Date   HERNIA REPAIR     INSERTION OF MESH  05/23/2022   Procedure: INSERTION OF MESH;  Surgeon: Lane Shope, MD;  Location: [facility] ORS;  Service: General;;   WISDOM TOOTH EXTRACTION      FAMILY HISTORY: Family History  Problem Relation Age of Onset   Cancer Maternal Grandmother        Breast   Cancer Maternal Grandfather    Aneurysm Paternal Grandfather        Brain    ADVANCED DIRECTIVES (Y/N):  N  HEALTH MAINTENANCE: Social History   Tobacco Use   Smoking status: Every Day    Current packs/day: 1.00    Average packs/day: 1 pack/day for 35.7 years (35.7 ttl pk-yrs)    Types: Cigarettes    Start date: 07/30/1988    Passive exposure: Past   Smokeless tobacco: Former  Building services engineer status: Never Used  Substance Use Topics   Alcohol use: Yes    Alcohol/week: 49.0 standard drinks of alcohol    Types: 42 Cans of beer, 7 Shots of liquor per week    Comment: 4-6 beers daily   Drug use: Yes  Types: Marijuana    Comment: maybe once or twice a month      Colonoscopy:  PAP:  Bone density:  Lipid panel:  Allergies  Allergen Reactions   Morphine Other (See Comments)    Morphine oral medication. Timothy Finley was unable to move his body and unable to speak that lasted about 30 minutes. Per Timothy Finley , it is sleep paralysis     Current Outpatient Medications  Medication Sig Dispense Refill   albuterol  (PROVENTIL ) (2.5 MG/3ML) 0.083% nebulizer solution  Take 3 mLs (2.5 mg total) by nebulization every 6 (six) hours as needed for wheezing or shortness of breath. 150 mL 1   albuterol  (VENTOLIN  HFA) 108 (90 Base) MCG/ACT inhaler Inhale 1-2 puffs into the lungs every 6 (six) hours as needed for wheezing or shortness of breath. 3 each 3   busPIRone  (BUSPAR ) 5 MG tablet Take 1-2 tablets (5-10 mg total) by mouth 2 (two) times daily. 180 tablet 1   cetirizine (ZYRTEC) 10 MG tablet Take 10 mg by mouth at bedtime.     clonazePAM  (KLONOPIN ) 0.5 MG tablet Take 1 tablet (0.5 mg total) by mouth 2 (two) times daily as needed for anxiety. 30 tablet 1   fluticasone  (FLONASE) 50 MCG/ACT nasal spray Place 2 sprays into both nostrils daily.     fluticasone -salmeterol (WIXELA INHUB) 250-50 MCG/ACT AEPB Inhale 1 puff into the lungs in the morning and at bedtime. 60 each 12   metoprolol  succinate (TOPROL -XL) 25 MG 24 hr tablet Take 1 tablet (25 mg total) by mouth daily. 90 tablet 1   naltrexone  (DEPADE) 50 MG tablet Take 1 tablet (50 mg total) by mouth daily. 30 tablet 2   rosuvastatin  (CRESTOR ) 40 MG tablet TAKE 1 TABLET BY MOUTH EVERY DAY 90 tablet 1   No current facility-administered medications for this visit.    OBJECTIVE: There were no vitals filed for this visit.    There is no height or weight on file to calculate BMI.    ECOG FS:0 - Asymptomatic  General: Well-developed, well-nourished, no acute distress. HEENT: Normocephalic. Neuro: Alert, answering all questions appropriately. Cranial nerves grossly intact. Psych: Normal affect.  LAB RESULTS:  Lab Results  Component Value Date   NA 138 01/28/2024   K 4.3 01/28/2024   CL 100 01/28/2024   CO2 19 (L) 01/28/2024   GLUCOSE 102 (H) 01/28/2024   BUN 12 01/28/2024   CREATININE 0.92 01/28/2024   CALCIUM  9.8 01/28/2024   PROT 7.6 01/28/2024   ALBUMIN 4.9 01/28/2024   AST 52 (H) 01/28/2024   ALT 93 (H) 01/28/2024   ALKPHOS 115 01/28/2024   BILITOT 0.5 01/28/2024   GFRNONAA >60 05/18/2022   GFRAA  121 02/17/2020    Lab Results  Component Value Date   WBC 11.3 (H) 03/23/2024   NEUTROABS 7.0 03/23/2024   HGB 15.3 03/23/2024   HCT 44.2 03/23/2024   MCV 93.6 03/23/2024   PLT 272 03/23/2024     STUDIES: No results found.  ASSESSMENT: CLL, less than 1% leukocytes.  PLAN:    CLL: Rai stage 0.  Peripheral blood flow cytometry revealed less than 1% of leukocytes consistent with CLL.  His most recent total white blood cell count is only mildly elevated at 11.3.  Upon review of Timothy Finley's chart, Timothy Finley's total white blood cell count has been intermittently elevated since at least January 2013.  All of his other laboratory work is either negative or within normal limits.  No intervention is needed at this time.  Timothy Finley does not require bone marrow biopsy.  Return to clinic in 6 months with repeat laboratory work and video-assisted telemedicine visit.    I provided 20 minutes of face-to-face video visit time during this encounter which included chart review, counseling, and coordination of care as documented above.   Timothy Finley expressed understanding and was in agreement with this plan. He also understands that He can call clinic at any time with any questions, concerns, or complaints.    Cancer Staging  CLL (chronic lymphocytic leukemia) (HCC) Staging form: Chronic Lymphocytic Leukemia / Small Lymphocytic Lymphoma, AJCC 8th Edition - Clinical stage from 04/03/2024: Modified Rai Stage 0 (Modified Rai risk: Low, Lymphocytosis: Present, Adenopathy: Absent, Organomegaly: Absent, Anemia: Absent, Thrombocytopenia: Absent) - Signed by Jacobo Evalene PARAS, MD on 04/03/2024 Stage prefix: Initial diagnosis   Evalene PARAS Jacobo, MD   04/03/2024 1:01 PM

## 2024-04-06 ENCOUNTER — Encounter: Payer: Self-pay | Admitting: Oncology

## 2024-04-07 ENCOUNTER — Encounter: Payer: Self-pay | Admitting: Family Medicine

## 2024-04-07 ENCOUNTER — Telehealth: Admitting: Oncology

## 2024-04-08 DIAGNOSIS — M25562 Pain in left knee: Secondary | ICD-10-CM | POA: Diagnosis not present

## 2024-04-13 ENCOUNTER — Telehealth: Admitting: Oncology

## 2024-04-14 DIAGNOSIS — M5416 Radiculopathy, lumbar region: Secondary | ICD-10-CM | POA: Diagnosis not present

## 2024-04-14 DIAGNOSIS — M9903 Segmental and somatic dysfunction of lumbar region: Secondary | ICD-10-CM | POA: Diagnosis not present

## 2024-04-14 DIAGNOSIS — M5417 Radiculopathy, lumbosacral region: Secondary | ICD-10-CM | POA: Diagnosis not present

## 2024-04-14 DIAGNOSIS — M9905 Segmental and somatic dysfunction of pelvic region: Secondary | ICD-10-CM | POA: Diagnosis not present

## 2024-04-20 ENCOUNTER — Telehealth: Payer: Self-pay | Admitting: *Deleted

## 2024-04-20 ENCOUNTER — Encounter: Payer: Self-pay | Admitting: *Deleted

## 2024-04-20 NOTE — Patient Instructions (Signed)
 Penne FORBES Silvan - I am sorry I was unable to reach you today.  I work with Vicci Duwaine SQUIBB, DO and am calling to support your healthcare needs. Please contact me at [phone] at your earliest convenience. I look forward to speaking with you soon.   Thank you,     Nichol Ator, LCSW [place]  [facility], [facility] Licensed Clinical Social Worker  Direct Dial: [phone]

## 2024-04-21 DIAGNOSIS — S83412D Sprain of medial collateral ligament of left knee, subsequent encounter: Secondary | ICD-10-CM | POA: Diagnosis not present

## 2024-04-30 ENCOUNTER — Ambulatory Visit: Admitting: Family Medicine

## 2024-05-07 ENCOUNTER — Other Ambulatory Visit: Payer: Self-pay | Admitting: *Deleted

## 2024-05-08 NOTE — Patient Instructions (Signed)
 Visit Information  Thank you for taking time to visit with me today. Please don't hesitate to contact me if I can be of assistance to you before our next scheduled appointment.  Your next care management appointment is by telephone on 05/28/24 at 1pm   Please call the care guide team at [phone] if you need to cancel, schedule, or reschedule an appointment.   Please call the Suicide and Crisis Lifeline: 988 call the [place]  National Suicide Prevention Lifeline: 7621465095 or TTY: 463-018-0427 TTY 601-683-3182) to talk to a trained counselor call 1-800-273-TALK (toll free, 24 hour hotline) call 911 if you are experiencing a Mental Health or Behavioral Health Crisis or need someone to talk to.  Shakenna Herrero, LCSW   [facility], [facility] Licensed Clinical Social Worker  Direct Dial: [phone]

## 2024-05-08 NOTE — Patient Outreach (Addendum)
 Complex Care Management   Visit Note  05/08/2024  Name:  Timothy Finley MRN: [id number] DOB: July 11, 1971  Situation: Referral received for Complex Care Management related to SDOH Barriers:  substance misuse I obtained verbal consent from Patient.  Visit completed with Patient  on the phone on 05/07/24  Background:   Past Medical History:  Diagnosis Date   Allergic bronchitis    Allergy    Anxiety    Asthma    Kidney stones     Assessment: Patient Reported Symptoms:  Cognitive Cognitive Status: Alert and oriented to person, place, and time, Insightful and able to interpret abstract concepts, Normal speech and language skills Cognitive/Intellectual Conditions Management [RPT]: None reported or documented in medical history or problem list   Health Maintenance Behaviors: None Health Facilitated by: Rest  Neurological Neurological Review of Symptoms: No symptoms reported    HEENT HEENT Symptoms Reported: No symptoms reported      Cardiovascular Cardiovascular Symptoms Reported: No symptoms reported    Respiratory Respiratory Symptoms Reported: Shortness of breath, Productive cough Additional Respiratory Details: smokes 1 pack of cigarettes per day for over 34 years-gets winded at times-feels it is time to quit-has asthma-uses a inhaler    Endocrine Endocrine Symptoms Reported: No symptoms reported    Gastrointestinal Gastrointestinal Symptoms Reported: No symptoms reported      Genitourinary Genitourinary Symptoms Reported: No symptoms reported    Integumentary Integumentary Symptoms Reported: No symptoms reported    Musculoskeletal Musculoskelatal Symptoms Reviewed: Back pain Additional Musculoskeletal Details: disc that pops out from time to time-sees a chiropractor regularly Musculoskeletal Management Strategies: Adequate rest, Coping strategies      Psychosocial Psychosocial Symptoms Reported: Substance use Additional Psychological Details: confirms work stress,  discussed cutting down on alcohol use, agreeable to ongoing mental health counseling Behavioral Management Strategies: Coping strategies, Medication therapy Major Change/Loss/Stressor/Fears (CP): Resources Behaviors When Feeling Stressed/Fearful: Gaming, spending time with family Techniques to [place] with Loss/Stress/Change: Medication, Diversional activities Quality of Family Relationships: supportive Do you feel physically threatened by others?: No    05/08/2024    PHQ2-9 Depression Screening   Little interest or pleasure in doing things Not at all  Feeling down, depressed, or hopeless Not at all  PHQ-2 - Total Score 0  Trouble falling or staying asleep, or sleeping too much    Feeling tired or having little energy    Poor appetite or overeating     Feeling bad about yourself - or that you are a failure or have let yourself or your family down    Trouble concentrating on things, such as reading the newspaper or watching television    Moving or speaking so slowly that other people could have noticed.  Or the opposite - being so fidgety or restless that you have been moving around a lot more than usual    Thoughts that you would be better off dead, or hurting yourself in some way    PHQ2-9 Total Score    If you checked off any problems, how difficult have these problems made it for you to do your work, take care of things at home, or get along with other people    Depression Interventions/Treatment      There were no vitals filed for this visit.  Medications Reviewed Today     Reviewed by Ermalinda Lenn HERO, LCSW (Social Worker) on 05/07/24 at 1103  Med List Status: <None>   Medication Order Taking? Sig Documenting Provider Last Dose Status Informant  albuterol  (PROVENTIL ) (  2.5 MG/3ML) 0.083% nebulizer solution [id number] Yes Take 3 mLs (2.5 mg total) by nebulization every 6 (six) hours as needed for wheezing or shortness of breath. Johnson, Megan P, DO  Active   albuterol  (VENTOLIN  HFA)  108 (90 Base) MCG/ACT inhaler [id number] Yes Inhale 1-2 puffs into the lungs every 6 (six) hours as needed for wheezing or shortness of breath. Vicci, Megan P, DO  Active   busPIRone  (BUSPAR ) 5 MG tablet [id number] Yes Take 1-2 tablets (5-10 mg total) by mouth 2 (two) times daily. Johnson, Megan P, DO  Active   cetirizine (ZYRTEC) 10 MG tablet [id number] Yes Take 10 mg by mouth at bedtime. [provider]  Active   clonazePAM  (KLONOPIN ) 0.5 MG tablet [id number] Yes Take 1 tablet (0.5 mg total) by mouth 2 (two) times daily as needed for anxiety. Johnson, Megan P, DO  Active   fluticasone  (FLONASE) 50 MCG/ACT nasal spray [id number] Yes Place 2 sprays into both nostrils daily. [provider]  Active   fluticasone -salmeterol [facility] INHUB) 250-50 MCG/ACT AEPB [id number] Yes Inhale 1 puff into the lungs in the morning and at bedtime. Johnson, Megan P, DO  Active   metoprolol  succinate (TOPROL -XL) 25 MG 24 hr tablet [id number] Yes Take 1 tablet (25 mg total) by mouth daily. Johnson, Megan P, DO  Active   naltrexone  (DEPADE) 50 MG tablet [id number] Yes Take 1 tablet (50 mg total) by mouth daily. Vicci Bouchard P, DO  Active   rosuvastatin  (CRESTOR ) 40 MG tablet [id number] Yes TAKE 1 TABLET BY MOUTH EVERY DAY Vicci Bouchard SQUIBB, DO  Active             Recommendation:   PCP Follow-up Ongoing mental health follow up  Follow Up Plan:   Telephone follow up appointment date/time:  05/28/24  Lenn Mean, LCSW [place]  [facility], [facility] Licensed Clinical Social Worker  Direct Dial: [phone]

## 2024-05-10 ENCOUNTER — Other Ambulatory Visit: Payer: Self-pay | Admitting: Family Medicine

## 2024-05-12 NOTE — Telephone Encounter (Signed)
 Too soon for refill.  Requested Prescriptions  Pending Prescriptions Disp Refills   metoprolol  succinate (TOPROL -XL) 25 MG 24 hr tablet [Pharmacy Med Name: METOPROLOL  SUCC ER 25 MG TAB] 15 tablet 5    Sig: TAKE 1/2 TABLET BY MOUTH EVERY DAY     Cardiovascular:  Beta Blockers Passed - 05/12/2024  3:25 PM      Passed - Last BP in normal range    BP Readings from Last 1 Encounters:  03/23/24 120/82         Passed - Last Heart Rate in normal range    Pulse Readings from Last 1 Encounters:  03/23/24 100         Passed - Valid encounter within last 6 months    Recent Outpatient Visits           3 months ago Mixed hyperlipidemia   [facility] [place], Megan P, DO   7 months ago SVT (supraventricular tachycardia)   [facility], Duwaine SQUIBB, DO       Future Appointments             In 5 months Finnegan, Evalene PARAS, MD [facility] Of [place]. [facility]

## 2024-05-28 ENCOUNTER — Telehealth: Payer: Self-pay | Admitting: *Deleted

## 2024-05-28 ENCOUNTER — Encounter: Payer: Self-pay | Admitting: *Deleted

## 2024-05-28 NOTE — Patient Instructions (Signed)
 Penne FORBES Silvan - I have attempted to call you three times but have been unsuccessful in reaching you. I work with Vicci Duwaine SQUIBB, DO and am calling to support your healthcare needs. If I can be of assistance to you, please contact me at [phone].       Thank you,    Briseida Gittings, LCSW [place]  [facility], [facility] Licensed Clinical Social Worker  Direct Dial: [phone]

## 2024-06-05 ENCOUNTER — Ambulatory Visit: Admitting: Family Medicine

## 2024-06-10 ENCOUNTER — Encounter: Payer: Self-pay | Admitting: Family Medicine

## 2024-06-10 ENCOUNTER — Ambulatory Visit (INDEPENDENT_AMBULATORY_CARE_PROVIDER_SITE_OTHER): Admitting: Family Medicine

## 2024-06-10 VITALS — BP 118/72 | HR 85 | Temp 98.0°F | Ht 68.0 in | Wt 168.4 lb

## 2024-06-10 DIAGNOSIS — C9110 Chronic lymphocytic leukemia of B-cell type not having achieved remission: Secondary | ICD-10-CM | POA: Diagnosis not present

## 2024-06-10 DIAGNOSIS — R5382 Chronic fatigue, unspecified: Secondary | ICD-10-CM

## 2024-06-10 DIAGNOSIS — Z1211 Encounter for screening for malignant neoplasm of colon: Secondary | ICD-10-CM

## 2024-06-10 DIAGNOSIS — I471 Supraventricular tachycardia, unspecified: Secondary | ICD-10-CM

## 2024-06-10 DIAGNOSIS — F410 Panic disorder [episodic paroxysmal anxiety] without agoraphobia: Secondary | ICD-10-CM | POA: Diagnosis not present

## 2024-06-10 DIAGNOSIS — Z23 Encounter for immunization: Secondary | ICD-10-CM

## 2024-06-10 DIAGNOSIS — R079 Chest pain, unspecified: Secondary | ICD-10-CM

## 2024-06-10 DIAGNOSIS — Z720 Tobacco use: Secondary | ICD-10-CM

## 2024-06-10 MED ORDER — METOPROLOL SUCCINATE ER 50 MG PO TB24: 50.0000 mg | ORAL_TABLET | Freq: Every day | ORAL | 0 refills | Status: AC

## 2024-06-10 MED ORDER — CLONAZEPAM 0.5 MG PO TABS: 0.5000 mg | ORAL_TABLET | Freq: Two times a day (BID) | ORAL | 1 refills | Status: AC | PRN

## 2024-06-10 MED ORDER — VARENICLINE TARTRATE (STARTER) 0.5 MG X 11 & 1 MG X 42 PO TBPK: ORAL_TABLET | ORAL | 0 refills | Status: DC

## 2024-06-10 MED ORDER — VARENICLINE TARTRATE 1 MG PO TABS: 1.0000 mg | ORAL_TABLET | Freq: Two times a day (BID) | ORAL | 2 refills | Status: AC

## 2024-06-10 NOTE — Progress Notes (Signed)
 BP 118/72   Pulse 85   Temp 98 F (36.7 C) (Oral)   Ht 5' 8 (1.727 m)   Wt 168 lb 6.4 oz (76.4 kg)   SpO2 98%   BMI 25.61 kg/m    Subjective:    Patient ID: Timothy Finley, male    DOB: Jun 26, 1971, 53 y.o.   MRN: [id number]  HPI: Timothy Finley is a 53 y.o. male  Chief Complaint  Patient presents with   Hypertension   Anxiety   Chest Pain    On and off for a couple months. Tightness. On inhalation mostly. With tachy    Nicotine  Dependence    Would like to try something to stop smoking    ANXIETY/STRESS Duration: chronic Status:stable Anxious mood: yes  Excessive worrying: yes Irritability: no  Sweating: no Nausea: no Palpitations:no Hyperventilation: no Panic attacks: no Agoraphobia: no  Obscessions/compulsions: no Depressed mood: no    05/07/2024   10:53 AM 03/23/2024    2:18 PM 01/28/2024    4:09 PM 11/14/2023    3:15 PM 10/31/2023    3:31 PM  Depression screen PHQ 2/9  Decreased Interest 0 0 1 0 0  Down, Depressed, Hopeless 0 0 1 0 0  PHQ - 2 Score 0 0 2 0 0  Altered sleeping   1    Tired, decreased energy   0    Change in appetite   0    Feeling bad or failure about yourself    1    Trouble concentrating   0    Moving slowly or fidgety/restless   0    Suicidal thoughts   0    PHQ-9 Score   4     Difficult doing work/chores   Somewhat difficult       Data saved with a previous flowsheet row definition   Anhedonia: no Weight changes: no Insomnia: yes hard to stay asleep  Hypersomnia: no Fatigue/loss of energy: yes Feelings of worthlessness: no Feelings of guilt: no Impaired concentration/indecisiveness: no Suicidal ideations: no  Crying spells: no Recent Stressors/Life Changes: no   Relationship problems: no   Family stress: no     Financial stress: no    Job stress: no    Recent death/loss: no  PALPITATIONS Duration: chronic Symptom description: heart racing and chest hurting Duration of episode: minutes Frequency:  recurrentl Activity when event occurred: rest Related to exertion: no Dyspnea: no Chest pain: yes Syncope: no Anxiety/stress: yes Nausea/vomiting: no Diaphoresis: no Coronary artery disease: no Congestive heart failure: no Arrhythmia:yes Thyroid disease: no Caffeine intake: 1 cafienated beverage Status:  stable Treatments attempted:metoprolol   SMOKING CESSATION Smoking Status: current every day smoker Smoking Amount: 1 ppd Smoking Onset: in his 52s Smoking Quit Date: not set Smoking triggers: stress Type of tobacco use: cigarettes Children in the house: no Other household members who smoke: no Treatments attempted: wellbutrin - didn't do well, chantix  Pneumovax: given today   Relevant past medical, surgical, family and social history reviewed and updated as indicated. Interim medical history since our last visit reviewed. Allergies and medications reviewed and updated.  Review of Systems  Constitutional: Negative.   Respiratory: Negative.    Cardiovascular:  Positive for chest pain. Negative for palpitations and leg swelling.  Musculoskeletal: Negative.   Psychiatric/Behavioral:  Positive for dysphoric mood and sleep disturbance. Negative for agitation, behavioral problems, confusion, decreased concentration, hallucinations, self-injury and suicidal ideas. The patient is nervous/anxious. The patient is not hyperactive.     Per  HPI unless specifically indicated above     Objective:    BP 118/72   Pulse 85   Temp 98 F (36.7 C) (Oral)   Ht 5' 8 (1.727 m)   Wt 168 lb 6.4 oz (76.4 kg)   SpO2 98%   BMI 25.61 kg/m   Wt Readings from Last 3 Encounters:  06/10/24 168 lb 6.4 oz (76.4 kg)  03/23/24 160 lb (72.6 kg)  01/28/24 156 lb (70.8 kg)    Physical Exam Vitals and nursing note reviewed.  Constitutional:      General: He is not in acute distress.    Appearance: Normal appearance. He is not ill-appearing, toxic-appearing or diaphoretic.  HENT:     Head:  Normocephalic and atraumatic.     Right Ear: External ear normal.     Left Ear: External ear normal.     Nose: Nose normal.     Mouth/Throat:     Mouth: Mucous membranes are moist.     Pharynx: Oropharynx is clear.  Eyes:     General: No scleral icterus.       Right eye: No discharge.        Left eye: No discharge.     Extraocular Movements: Extraocular movements intact.     Conjunctiva/sclera: Conjunctivae normal.     Pupils: Pupils are equal, round, and reactive to light.  Cardiovascular:     Rate and Rhythm: Regular rhythm. Tachycardia present.     Pulses: Normal pulses.     Heart sounds: Normal heart sounds. No murmur heard.    No friction rub. No gallop.  Pulmonary:     Effort: Pulmonary effort is normal. No respiratory distress.     Breath sounds: Normal breath sounds. No stridor. No wheezing, rhonchi or rales.  Chest:     Chest wall: No tenderness.  Musculoskeletal:        General: Normal range of motion.     Cervical back: Normal range of motion and neck supple.  Skin:    General: Skin is warm and dry.     Capillary Refill: Capillary refill takes less than 2 seconds.     Coloration: Skin is not jaundiced or pale.     Findings: No bruising, erythema, lesion or rash.  Neurological:     General: No focal deficit present.     Mental Status: He is alert and oriented to person, place, and time. Mental status is at baseline.  Psychiatric:        Mood and Affect: Mood normal.        Behavior: Behavior normal.        Thought Content: Thought content normal.        Judgment: Judgment normal.     Results for orders placed or performed in visit on 03/23/24  CBC with Differential/Platelet   Collection Time: 03/23/24  2:09 PM  Result Value Ref Range   WBC 11.3 (H) 4.0 - 10.5 K/uL   RBC 4.72 4.22 - 5.81 MIL/uL   Hemoglobin 15.3 13.0 - 17.0 g/dL   HCT 55.7 60.9 - 47.9 %   MCV 93.6 80.0 - 100.0 fL   MCH 32.4 26.0 - 34.0 pg   MCHC 34.6 30.0 - 36.0 g/dL   RDW 87.4 88.4 -  84.4 %   Platelets 272 150 - 400 K/uL   nRBC 0.0 0.0 - 0.2 %   Neutrophils Relative % 62 %   Neutro Abs 7.0 1.7 - 7.7 K/uL   Lymphocytes Relative 23 %  Lymphs Abs 2.6 0.7 - 4.0 K/uL   Monocytes Relative 8 %   Monocytes Absolute 0.9 0.1 - 1.0 K/uL   Eosinophils Relative 3 %   Eosinophils Absolute 0.3 0.0 - 0.5 K/uL   Basophils Relative 1 %   Basophils Absolute 0.1 0.0 - 0.1 K/uL   Immature Granulocytes 3 %   Abs Immature Granulocytes 0.34 (H) 0.00 - 0.07 K/uL  BCR-ABL1, CML/ALL, PCR, QUANT   Collection Time: 03/23/24  2:09 PM  Result Value Ref Range   b2a2 transcript <0.0032 %   b3a2 transcript <0.0032 %   E1A2 Transcript <0.0032 %   Interpretation (BCRAL): Negative    Director Review [facility]): Comment    Background: Comment    Methodology Comment    References (BCRAL) Comment   Flow cytometry panel-leukemia/lymphoma work-up   Collection Time: 03/23/24  2:09 PM  Result Value Ref Range   PATH INTERP XXX-IMP Comment    ANNOTATION COMMENT IMP Comment    CLINICAL INFO Comment    Specimen Type Comment    ASSESSMENT OF LEUKOCYTES Comment    % Viable Cells Comment    Immunophenotypic Profile 0    ANALYSIS AND GATING STRATEGY Comment    IMMUNOPHENOTYPING STUDY Comment    PATHOLOGIST NAME Comment    COMMENT: Comment       Assessment & Plan:   Problem List Items Addressed This Visit       Cardiovascular and Mediastinum   SVT (supraventricular tachycardia)   Still acting up. Will increase his metoprolol  to 50mg  and get him into cardiology. Call with any concerns.       Relevant Medications   metoprolol  succinate (TOPROL -XL) 50 MG 24 hr tablet   Other Relevant Orders   Ambulatory referral to Cardiology     Other   Tobacco abuse   Will start him on chantix. Recheck 1 month. Call with any concerns.       Panic disorder - Primary   Under good control on current regimen. Continue current regimen. Continue to monitor. Call with any concerns. Refills given.         Relevant Medications   clonazePAM  (KLONOPIN ) 0.5 MG tablet   CLL (chronic lymphocytic leukemia) (HCC)   Newly diagnosed. Following with oncology. Call with any concerns. Continue to monitor.       Relevant Medications   clonazePAM  (KLONOPIN ) 0.5 MG tablet   Other Visit Diagnoses       Chronic fatigue       Will check labs and testosterone. Await results.     Chest pain, unspecified type       At night when his heart is racing- referral to cardiology placed today.   Relevant Orders   Ambulatory referral to Cardiology     Screening for colon cancer       Cologuard ordered today.   Relevant Orders   Cologuard     Need for vaccination for Strep pneumoniae       Prevnar given today.   Relevant Orders   Pneumococcal conjugate vaccine 20-valent (Prevnar 20)        Follow up plan: Return 4-6 weeks.

## 2024-06-10 NOTE — Assessment & Plan Note (Signed)
 Still acting up. Will increase his metoprolol  to 50mg  and get him into cardiology. Call with any concerns.

## 2024-06-10 NOTE — Assessment & Plan Note (Signed)
 Will start him on chantix. Recheck 1 month. Call with any concerns.

## 2024-06-10 NOTE — Assessment & Plan Note (Signed)
 Newly diagnosed. Following with oncology. Call with any concerns. Continue to monitor.

## 2024-06-10 NOTE — Assessment & Plan Note (Signed)
 Under good control on current regimen. Continue current regimen. Continue to monitor. Call with any concerns. Refills given.

## 2024-06-12 LAB — TESTOSTERONE, FREE, TOTAL, SHBG
Sex Hormone Binding: 34.1 nmol/L (ref 19.3–76.4)
Testosterone, Free: 10.1 pg/mL (ref 7.2–24.0)
Testosterone: 401 ng/dL (ref 264–916)

## 2024-06-17 ENCOUNTER — Encounter: Payer: Self-pay | Admitting: *Deleted

## 2024-06-17 ENCOUNTER — Other Ambulatory Visit: Payer: Self-pay | Admitting: *Deleted

## 2024-06-17 NOTE — Patient Instructions (Signed)
 Penne FORBES Silvan - I am sorry I was unable to reach you today.  I work with Vicci Duwaine SQUIBB, DO and am calling to support your healthcare needs. Please contact me at [phone] at your earliest convenience. I look forward to speaking with you soon.   Thank you,     Nichol Ator, LCSW [place]  [facility], [facility] Licensed Clinical Social Worker  Direct Dial: [phone]

## 2024-07-16 ENCOUNTER — Other Ambulatory Visit: Payer: Self-pay | Admitting: Family Medicine

## 2024-07-16 NOTE — Telephone Encounter (Signed)
 Requested Prescriptions  Pending Prescriptions Disp Refills   albuterol  (VENTOLIN  HFA) 108 (90 Base) MCG/ACT inhaler [Pharmacy Med Name: ALBUTEROL  HFA (VENTOLIN ) INH] 18 each 0    Sig: INHALE 1-2 PUFFS BY MOUTH EVERY 6 HOURS AS NEEDED FOR WHEEZE OR SHORTNESS OF BREATH     Pulmonology:  Beta Agonists 2 Passed - 07/16/2024  4:16 PM      Passed - Last BP in normal range    BP Readings from Last 1 Encounters:  06/10/24 118/72         Passed - Last Heart Rate in normal range    Pulse Readings from Last 1 Encounters:  06/10/24 85         Passed - Valid encounter within last 12 months    Recent Outpatient Visits           1 month ago Panic disorder   [place] [facility] [place], [place], DO   5 months ago Mixed hyperlipidemia   [place] [facility] [place], Megan P, DO   9 months ago SVT (supraventricular tachycardia)   [place] [facility] Vicci Duwaine SQUIBB, DO       Future Appointments             In 2 months Finnegan, Evalene PARAS, MD [facility] - A Dept Of [place]. [facility]             rosuvastatin  (CRESTOR ) 40 MG tablet [Pharmacy Med Name: ROSUVASTATIN  CALCIUM  40 MG TAB] 90 tablet 0    Sig: TAKE 1 TABLET BY MOUTH EVERY DAY     Cardiovascular:  Antilipid - Statins 2 Failed - 07/16/2024  4:16 PM      Failed - Lipid Panel in normal range within the last 12 months    Cholesterol, Total  Date Value Ref Range Status  01/28/2024 199 100 - 199 mg/dL Final   LDL Chol Calc (NIH)  Date Value Ref Range Status  01/28/2024 66 0 - 99 mg/dL Final   HDL  Date Value Ref Range Status  01/28/2024 71 >39 mg/dL Final   Triglycerides  Date Value Ref Range Status  01/28/2024 406 (H) 0 - 149 mg/dL Final         Passed - Cr in normal range and within 360 days    Creatinine  Date Value Ref Range Status  06/07/2022 88.2 20.0 - 300.0 mg/dL Final  98/82/7986 8.89 0.60 - 1.30 mg/dL Final   Creatinine, Ser   Date Value Ref Range Status  01/28/2024 0.92 0.76 - 1.27 mg/dL Final         Passed - Patient is not pregnant      Passed - Valid encounter within last 12 months    Recent Outpatient Visits           1 month ago Panic disorder   [place] [facility] [place], Megan P, DO   5 months ago Mixed hyperlipidemia   [place] [facility] [place], Megan P, DO   9 months ago SVT (supraventricular tachycardia)   [place] [facility] [place], Duwaine SQUIBB, DO       Future Appointments             In 2 months Finnegan, Evalene PARAS, MD [facility] - A Dept Of [place]. [facility]

## 2024-07-20 ENCOUNTER — Ambulatory Visit: Admitting: Family Medicine

## 2024-07-20 ENCOUNTER — Encounter: Payer: Self-pay | Admitting: Family Medicine

## 2024-07-20 VITALS — BP 127/73 | HR 79 | Temp 98.6°F | Ht 68.0 in | Wt 170.8 lb

## 2024-07-20 DIAGNOSIS — Z720 Tobacco use: Secondary | ICD-10-CM

## 2024-07-20 DIAGNOSIS — Z23 Encounter for immunization: Secondary | ICD-10-CM | POA: Diagnosis not present

## 2024-07-20 DIAGNOSIS — I471 Supraventricular tachycardia, unspecified: Secondary | ICD-10-CM | POA: Diagnosis not present

## 2024-07-20 NOTE — Assessment & Plan Note (Signed)
 Doing great on the metoprolol . Call with any concerns. Continue current regimen. Recheck 2-3 months.

## 2024-07-20 NOTE — Progress Notes (Signed)
 "  BP 127/73   Pulse 79   Temp 98.6 F (37 C) (Oral)   Ht 5' 8 (1.727 m)   Wt 170 lb 12.8 oz (77.5 kg)   SpO2 97%   BMI 25.97 kg/m    Subjective:    Patient ID: Timothy Finley, male    DOB: 11/19/1970, 53 y.o.   MRN: [id number]  HPI: MIHAILO Finley is a 53 y.o. male  Chief Complaint  Patient presents with   Nicotine  Dependence    Patient states he started the Chantix  on 07/02/24 and has not smoked a cigarette since 07/07/24.    SMOKING CESSATION Smoking Status: former Smoking Amount: none Smoking Onset: in his 25s Smoking Quit Date: 07/07/24 Smoking triggers: stress Type of tobacco use: cigarettes Children in the house: no Other household members who smoke: no Treatments attempted: wellbutrin - didn't do well, chantix   Pneumovax: up to date  PALPITATIONS- entirely resolved with higher dose of metoprolol . Feeling well.  Duration: chronic Symptom description: heart racing and chest hurting Duration of episode: minutes Frequency: recurrentl Activity when event occurred: rest Related to exertion: no Dyspnea: no Chest pain: no Syncope: no Anxiety/stress: yes Nausea/vomiting: no Diaphoresis: no Coronary artery disease: no Congestive heart failure: no Arrhythmia:yes Thyroid disease: no Caffeine intake: 1 cafienated beverage Status:  resolved Treatments attempted:metoprolol    Relevant past medical, surgical, family and social history reviewed and updated as indicated. Interim medical history since our last visit reviewed. Allergies and medications reviewed and updated.  Review of Systems  Constitutional: Negative.   Respiratory: Negative.    Cardiovascular: Negative.   Musculoskeletal: Negative.   Neurological: Negative.   Psychiatric/Behavioral: Negative.      Per HPI unless specifically indicated above     Objective:    BP 127/73   Pulse 79   Temp 98.6 F (37 C) (Oral)   Ht 5' 8 (1.727 m)   Wt 170 lb 12.8 oz (77.5 kg)   SpO2 97%   BMI 25.97  kg/m   Wt Readings from Last 3 Encounters:  07/20/24 170 lb 12.8 oz (77.5 kg)  06/10/24 168 lb 6.4 oz (76.4 kg)  03/23/24 160 lb (72.6 kg)    Physical Exam Vitals and nursing note reviewed.  Constitutional:      General: He is not in acute distress.    Appearance: Normal appearance. He is not ill-appearing, toxic-appearing or diaphoretic.  HENT:     Head: Normocephalic and atraumatic.     Right Ear: External ear normal.     Left Ear: External ear normal.     Nose: Nose normal.     Mouth/Throat:     Mouth: Mucous membranes are moist.     Pharynx: Oropharynx is clear.  Eyes:     General: No scleral icterus.       Right eye: No discharge.        Left eye: No discharge.     Extraocular Movements: Extraocular movements intact.     Conjunctiva/sclera: Conjunctivae normal.     Pupils: Pupils are equal, round, and reactive to light.  Cardiovascular:     Rate and Rhythm: Normal rate and regular rhythm.     Pulses: Normal pulses.     Heart sounds: Normal heart sounds. No murmur heard.    No friction rub. No gallop.  Pulmonary:     Effort: Pulmonary effort is normal. No respiratory distress.     Breath sounds: Normal breath sounds. No stridor. No wheezing, rhonchi or rales.  Chest:  Chest wall: No tenderness.  Musculoskeletal:        General: Normal range of motion.     Cervical back: Normal range of motion and neck supple.  Skin:    General: Skin is warm and dry.     Capillary Refill: Capillary refill takes less than 2 seconds.     Coloration: Skin is not jaundiced or pale.     Findings: No bruising, erythema, lesion or rash.  Neurological:     General: No focal deficit present.     Mental Status: He is alert and oriented to person, place, and time. Mental status is at baseline.  Psychiatric:        Mood and Affect: Mood normal.        Behavior: Behavior normal.        Thought Content: Thought content normal.        Judgment: Judgment normal.     Results for orders  placed or performed in visit on 06/10/24  Testosterone , free, total([facility])   Collection Time: 06/10/24 10:22 AM  Result Value Ref Range   Testosterone  401 264 - 916 ng/dL   Testosterone , Free 10.1 7.2 - 24.0 pg/mL   Sex Hormone Binding 34.1 19.3 - 76.4 nmol/L      Assessment & Plan:   Problem List Items Addressed This Visit       Cardiovascular and Mediastinum   SVT (supraventricular tachycardia)   Doing great on the metoprolol . Call with any concerns. Continue current regimen. Recheck 2-3 months.         Other   Tobacco abuse - Primary   Quite 07/07/24. Continue chantix  for now. Will wean off when ready- recheck in 2-3 months.       Other Visit Diagnoses       Need for hepatitis B vaccination       Hep B #2 given today.   Relevant Orders   Heplisav-B  (HepB-CPG) Vaccine        Follow up plan: Return in about 3 months (around 10/18/2024) for physical.      "

## 2024-07-20 NOTE — Assessment & Plan Note (Addendum)
 Quite 07/07/24. Continue chantix  for now. Will wean off when ready- recheck in 2-3 months.

## 2024-08-07 ENCOUNTER — Ambulatory Visit: Payer: Self-pay

## 2024-08-07 ENCOUNTER — Ambulatory Visit: Admitting: Family Medicine

## 2024-08-07 ENCOUNTER — Encounter: Payer: Self-pay | Admitting: Family Medicine

## 2024-08-07 VITALS — BP 110/75 | HR 83 | Temp 98.0°F | Ht 68.0 in | Wt 166.4 lb

## 2024-08-07 DIAGNOSIS — J101 Influenza due to other identified influenza virus with other respiratory manifestations: Secondary | ICD-10-CM | POA: Diagnosis not present

## 2024-08-07 DIAGNOSIS — R051 Acute cough: Secondary | ICD-10-CM | POA: Diagnosis not present

## 2024-08-07 LAB — VERITOR FLU A/B WAIVED
Influenza A: POSITIVE — AB
Influenza B: NEGATIVE

## 2024-08-07 LAB — POC COVID19 BINAXNOW: SARS Coronavirus 2 Ag: NEGATIVE

## 2024-08-07 MED ORDER — PREDNISONE 50 MG PO TABS: 50.0000 mg | ORAL_TABLET | Freq: Every day | ORAL | 0 refills | Status: AC

## 2024-08-07 MED ORDER — HYDROCOD POLI-CHLORPHE POLI ER 10-8 MG/5ML PO SUER: 5.0000 mL | Freq: Two times a day (BID) | ORAL | 0 refills | Status: AC | PRN

## 2024-08-07 MED ORDER — BENZONATATE 200 MG PO CAPS: 200.0000 mg | ORAL_CAPSULE | Freq: Two times a day (BID) | ORAL | 0 refills | Status: AC | PRN

## 2024-08-07 MED ORDER — METHYLPREDNISOLONE SODIUM SUCC 40 MG IJ SOLR
40.0000 mg | Freq: Once | INTRAMUSCULAR | Status: AC
  Administered 2024-08-07: 40 mg via INTRAMUSCULAR

## 2024-08-07 NOTE — Telephone Encounter (Signed)
 FYI Only or Action Required?: Action required by provider: request for appointment.  Patient was last seen in primary care on 07/20/2024 by Vicci Duwaine SQUIBB, DO.  Called Nurse Triage reporting Cough.  Symptoms began several days ago.  Interventions attempted: Prescription medications: Albuterol  nebulizer.  Symptoms are: gradually improving. Coughing all night,sinus pain, body aches.  Triage Disposition: See HCP Within 4 Hours (Or PCP Triage)  Patient/caregiver understands and will follow disposition?: Yes      Copied from CRM [id number]. Topic: Clinical - Red Word Triage >> Aug 07, 2024  8:42 AM Treva T wrote: Red Word that prompted transfer to Nurse Triage: Pt calling reports has not felt well since Sunday, woke up with tickle in throat has worsened throughout the week, coughing, chills, pain in stomach and side from coughing, body aches and pain, painful with breathing, shortness of breath.   Pt has been taking medication for symptoms to help with symptoms but no relief.   Pt requesting appt for evaluation. Reason for Disposition  [1] MILD difficulty breathing (e.g., minimal/no SOB at rest, SOB with walking, pulse < 100) AND [2] still present when not coughing  Answer Assessment - Initial Assessment Questions 1. ONSET: When did the cough begin?      Sunday 2. SEVERITY: How bad is the cough today?      severe 3. SPUTUM: Describe the color of your sputum (e.g., none, dry cough; clear, white, yellow, green)     yellow 4. HEMOPTYSIS: Are you coughing up any blood? If Yes, ask: How much? (e.g., flecks, streaks, tablespoons, etc.)     no 5. DIFFICULTY BREATHING: Are you having difficulty breathing? If Yes, ask: How bad is it? (e.g., mild, moderate, severe)      mild 6. FEVER: Do you have a fever? If Yes, ask: What is your temperature, how was it measured, and when did it start?     None today, wakes up sweating 7. CARDIAC HISTORY: Do you have any history of  heart disease? (e.g., heart attack, congestive heart failure)      no 8. LUNG HISTORY: Do you have any history of lung disease?  (e.g., pulmonary embolus, asthma, emphysema)     bronchitis 9. PE RISK FACTORS: Do you have a history of blood clots? (or: recent major surgery, recent prolonged travel, bedridden)     no 10. OTHER SYMPTOMS: Do you have any other symptoms? (e.g., runny nose, wheezing, chest pain)       Face, sinus pain 11. PREGNANCY: Is there any chance you are pregnant? When was your last menstrual period?       N/a 12. TRAVEL: Have you traveled out of the country in the last month? (e.g., travel history, exposures)       no  Protocols used: Cough - Acute Non-Productive-A-AH

## 2024-08-07 NOTE — Progress Notes (Signed)
 "  BP 110/75   Pulse 83   Temp 98 F (36.7 C) (Oral)   Ht 5' 8 (1.727 m)   Wt 166 lb 6.4 oz (75.5 kg)   SpO2 98%   BMI 25.30 kg/m    Subjective:    Patient ID: Timothy Finley, male    DOB: 11/13/1970, 54 y.o.   MRN: [id number]  HPI: Timothy Finley is a 54 y.o. male  Chief Complaint  Patient presents with   URI    Patient states he has been having a cough, body aches, and sweats. States he has had these symptoms since Monday morning. States he feels worse at nighttime. States he has been taking Mucinex, Sudafed, Ibuprofen , and some left over Amoxicillin    UPPER RESPIRATORY TRACT INFECTION Duration: 4-5 days Worst symptom: cough Fever: yes Cough: yes Shortness of breath: yes Wheezing: yes Chest pain: yes Chest tightness: yes Chest congestion: no Nasal congestion: no Runny nose: no Post nasal drip: yes Sneezing: yes Sore throat: no Swollen glands: no Sinus pressure: yes Headache: no Face pain: no Toothache: no Ear pain: no  Ear pressure: no  Eyes red/itching:no Eye drainage/crusting: no  Vomiting: no Rash: no Fatigue: yes Sick contacts: yes Strep contacts: no  Context: worse Recurrent sinusitis: no Relief with OTC cold/cough medications: no  Treatments attempted: cold/sinus, mucinex, anti-histamine, pseudoephedrine, cough syrup, and antibiotics   Relevant past medical, surgical, family and social history reviewed and updated as indicated. Interim medical history since our last visit reviewed. Allergies and medications reviewed and updated.  Review of Systems  Constitutional:  Positive for chills, diaphoresis, fatigue and fever. Negative for activity change, appetite change and unexpected weight change.  HENT:  Positive for congestion and postnasal drip. Negative for dental problem, drooling, ear discharge, ear pain, facial swelling, hearing loss, mouth sores, nosebleeds, rhinorrhea, sinus pressure, sinus pain, sneezing, sore throat, tinnitus, trouble  swallowing and voice change.   Eyes: Negative.   Respiratory:  Positive for cough and chest tightness. Negative for apnea, choking, shortness of breath, wheezing and stridor.   Cardiovascular: Negative.   Psychiatric/Behavioral: Negative.      Per HPI unless specifically indicated above     Objective:    BP 110/75   Pulse 83   Temp 98 F (36.7 C) (Oral)   Ht 5' 8 (1.727 m)   Wt 166 lb 6.4 oz (75.5 kg)   SpO2 98%   BMI 25.30 kg/m   Wt Readings from Last 3 Encounters:  08/07/24 166 lb 6.4 oz (75.5 kg)  07/20/24 170 lb 12.8 oz (77.5 kg)  06/10/24 168 lb 6.4 oz (76.4 kg)    Physical Exam Vitals and nursing note reviewed.  Constitutional:      General: He is not in acute distress.    Appearance: Normal appearance. He is ill-appearing. He is not toxic-appearing or diaphoretic.  HENT:     Head: Normocephalic and atraumatic.     Right Ear: Tympanic membrane, ear canal and external ear normal.     Left Ear: Tympanic membrane, ear canal and external ear normal.     Nose: Rhinorrhea present. No congestion.     Mouth/Throat:     Mouth: Mucous membranes are moist.     Pharynx: Oropharynx is clear. No oropharyngeal exudate or posterior oropharyngeal erythema.  Eyes:     General: No scleral icterus.       Right eye: No discharge.        Left eye: No discharge.  Extraocular Movements: Extraocular movements intact.     Conjunctiva/sclera: Conjunctivae normal.     Pupils: Pupils are equal, round, and reactive to light.  Cardiovascular:     Rate and Rhythm: Normal rate and regular rhythm.     Pulses: Normal pulses.     Heart sounds: Normal heart sounds. No murmur heard.    No friction rub. No gallop.  Pulmonary:     Effort: Pulmonary effort is normal. No respiratory distress.     Breath sounds: Normal breath sounds. No stridor. No wheezing, rhonchi or rales.  Chest:     Chest wall: No tenderness.  Musculoskeletal:        General: Normal range of motion.     Cervical back:  Normal range of motion and neck supple.  Skin:    General: Skin is warm and dry.     Capillary Refill: Capillary refill takes less than 2 seconds.     Coloration: Skin is not jaundiced or pale.     Findings: No bruising, erythema, lesion or rash.  Neurological:     General: No focal deficit present.     Mental Status: He is alert and oriented to person, place, and time. Mental status is at baseline.  Psychiatric:        Mood and Affect: Mood normal.        Behavior: Behavior normal.        Thought Content: Thought content normal.        Judgment: Judgment normal.     Results for orders placed or performed in visit on 06/10/24  Testosterone , free, total([facility])   Collection Time: 06/10/24 10:22 AM  Result Value Ref Range   Testosterone  401 264 - 916 ng/dL   Testosterone , Free 10.1 7.2 - 24.0 pg/mL   Sex Hormone Binding 34.1 19.3 - 76.4 nmol/L      Assessment & Plan:   Problem List Items Addressed This Visit   None Visit Diagnoses       Influenza A    -  Primary   Out of window for tamilfu. Will treat with steroids and cough medicine. Call if not getting better or getting worse. Call with any concerns.     Acute cough       Relevant Medications   methylPREDNISolone  sodium succinate (SOLU-MEDROL ) 40 mg/mL injection 40 mg (Start on 08/07/2024  1:45 PM)   Other Relevant Orders   Influenza A & B (STAT)   POC COVID-19        Follow up plan: Return if symptoms worsen or fail to improve.      "

## 2024-08-15 ENCOUNTER — Other Ambulatory Visit: Payer: Self-pay | Admitting: Family Medicine

## 2024-08-17 NOTE — Telephone Encounter (Signed)
 Requested Prescriptions  Pending Prescriptions Disp Refills   albuterol  (VENTOLIN  HFA) 108 (90 Base) MCG/ACT inhaler [Pharmacy Med Name: ALBUTEROL  HFA (VENTOLIN ) INH] 18 each 0    Sig: INHALE 1-2 PUFFS BY MOUTH EVERY 6 HOURS AS NEEDED FOR WHEEZE OR SHORTNESS OF BREATH     Pulmonology:  [facility] Agonists 2 Passed - 08/17/2024 12:19 PM      Passed - Last BP in normal range    BP Readings from Last 1 Encounters:  08/07/24 110/75         Passed - Last Heart Rate in normal range    Pulse Readings from Last 1 Encounters:  08/07/24 83         Passed - Valid encounter within last 12 months    Recent Outpatient Visits           1 week ago Influenza A   [place] [facility] [place], Megan P, DO   4 weeks ago Tobacco abuse   [place] [facility] [place], Paisley, DO   2 months ago Panic disorder   [place] [facility], Megan P, DO   6 months ago Mixed hyperlipidemia   [place] [facility] [place], Megan P, DO   10 months ago SVT (supraventricular tachycardia)   [place] [facility] Vicci Duwaine SQUIBB, DO       Future Appointments             In 1 month Finnegan, Evalene PARAS, MD [facility] Of [place]. [facility]

## 2024-10-02 ENCOUNTER — Other Ambulatory Visit

## 2024-10-05 ENCOUNTER — Inpatient Hospital Stay: Payer: Self-pay

## 2024-10-09 ENCOUNTER — Telehealth: Admitting: Oncology

## 2024-10-13 ENCOUNTER — Inpatient Hospital Stay: Payer: Self-pay | Admitting: Oncology

## 2024-10-20 ENCOUNTER — Encounter: Admitting: Family Medicine
